# Patient Record
Sex: Female | Born: 1965 | Race: Black or African American | Hispanic: No | Marital: Single | Smoking: Never smoker
Health system: Southern US, Community
[De-identification: ages and names within clinical notes are randomized; demographics above are authoritative.]

## PROBLEM LIST (undated history)

## (undated) DIAGNOSIS — D649 Anemia, unspecified: Secondary | ICD-10-CM

## (undated) DIAGNOSIS — I10 Essential (primary) hypertension: Secondary | ICD-10-CM

## (undated) DIAGNOSIS — E079 Disorder of thyroid, unspecified: Secondary | ICD-10-CM

## (undated) HISTORY — DX: Anemia, unspecified: D64.9

---

## 2012-12-10 ENCOUNTER — Emergency Department (HOSPITAL_COMMUNITY)
Admission: EM | Admit: 2012-12-10 | Discharge: 2012-12-10 | Disposition: A | Discharge status: Home / Self Care | Payer: BC Managed Care – PPO | Attending: Emergency Medicine | Admitting: Emergency Medicine

## 2012-12-10 ENCOUNTER — Emergency Department (HOSPITAL_COMMUNITY): Payer: BC Managed Care – PPO

## 2012-12-10 ENCOUNTER — Encounter (HOSPITAL_COMMUNITY): Payer: Self-pay | Admitting: Nurse Practitioner

## 2012-12-10 DIAGNOSIS — D259 Leiomyoma of uterus, unspecified: Secondary | ICD-10-CM | POA: Insufficient documentation

## 2012-12-10 DIAGNOSIS — Z3202 Encounter for pregnancy test, result negative: Secondary | ICD-10-CM | POA: Insufficient documentation

## 2012-12-10 DIAGNOSIS — R110 Nausea: Secondary | ICD-10-CM

## 2012-12-10 DIAGNOSIS — Z79899 Other long term (current) drug therapy: Secondary | ICD-10-CM | POA: Insufficient documentation

## 2012-12-10 DIAGNOSIS — D219 Benign neoplasm of connective and other soft tissue, unspecified: Secondary | ICD-10-CM

## 2012-12-10 DIAGNOSIS — E079 Disorder of thyroid, unspecified: Secondary | ICD-10-CM | POA: Insufficient documentation

## 2012-12-10 DIAGNOSIS — Z7982 Long term (current) use of aspirin: Secondary | ICD-10-CM | POA: Insufficient documentation

## 2012-12-10 DIAGNOSIS — R102 Pelvic and perineal pain: Secondary | ICD-10-CM

## 2012-12-10 DIAGNOSIS — Z885 Allergy status to narcotic agent status: Secondary | ICD-10-CM | POA: Insufficient documentation

## 2012-12-10 DIAGNOSIS — I10 Essential (primary) hypertension: Secondary | ICD-10-CM | POA: Insufficient documentation

## 2012-12-10 HISTORY — DX: Essential (primary) hypertension: I10

## 2012-12-10 HISTORY — DX: Disorder of thyroid, unspecified: E07.9

## 2012-12-10 LAB — COMPREHENSIVE METABOLIC PANEL
AST: 23 U/L (ref 0–37)
CO2: 28 mEq/L (ref 19–32)
Calcium: 9 mg/dL (ref 8.4–10.5)
Creatinine, Ser: 0.98 mg/dL (ref 0.50–1.10)
GFR calc Af Amer: 80 mL/min — ABNORMAL LOW (ref >90)
GFR calc non Af Amer: 69 mL/min — ABNORMAL LOW (ref >90)
Total Protein: 7 g/dL (ref 6.0–8.3)

## 2012-12-10 LAB — CBC WITH DIFFERENTIAL/PLATELET
Hemoglobin: 12.9 g/dL (ref 12.0–15.0)
Lymphs Abs: 1.3 10*3/uL (ref 0.7–4.0)
Monocytes Relative: 13 % — ABNORMAL HIGH (ref 3–12)
Neutro Abs: 1.6 10*3/uL — ABNORMAL LOW (ref 1.7–7.7)
Neutrophils Relative %: 45 % (ref 43–77)
RBC: 4.18 MIL/uL (ref 3.87–5.11)

## 2012-12-10 LAB — URINALYSIS W MICROSCOPIC + REFLEX CULTURE
Glucose, UA: NEGATIVE mg/dL
Hgb urine dipstick: NEGATIVE
Ketones, ur: NEGATIVE mg/dL
Protein, ur: NEGATIVE mg/dL

## 2012-12-10 LAB — WET PREP, GENITAL
Clue Cells Wet Prep HPF POC: NONE SEEN
Trich, Wet Prep: NONE SEEN
Yeast Wet Prep HPF POC: NONE SEEN

## 2012-12-10 LAB — POCT PREGNANCY, URINE: Preg Test, Ur: NEGATIVE

## 2012-12-10 MED ORDER — TRAMADOL HCL 50 MG PO TABS: 50.0000 mg | ORAL_TABLET | Freq: Four times a day (QID) | ORAL | Status: DC | PRN

## 2012-12-10 MED ORDER — ONDANSETRON 4 MG PO TBDP: 4.0000 mg | ORAL_TABLET | Freq: Three times a day (TID) | ORAL | Status: DC | PRN

## 2012-12-10 NOTE — ED Provider Notes (Signed)
History     CSN: [id number]  Arrival date & time 12/10/12  6295   First MD Initiated Contact with Patient 12/10/12 1039      Chief Complaint  Patient presents with  . Abdominal Pain    (Consider location/radiation/quality/duration/timing/severity/associated sxs/prior treatment) HPI Comments: Patient presents to the ED for abdominal pain.  Patient states she saw her eye doctor last week and he told her there were spots in her eyes which could possibly indicate a gastrointestinal disorder or colon cancer. Ever since then she has experienced generalized abdominal pain associated with intermittent nausea but denies any episodes of vomiting.  Also notes that her stomach has been making weird noises lately and she does not know why.  BMs have been normal and without noted blood.  Has clear vaginal discharge which she feels is normal for her.  Pt is not currently sexually active and is not concerned for STDs at this time.  Denies any urinary sx including increased frequency, dysuria, or hematuria.  Pt recently found out she has a positive family hx of colon cancer on her mother's side- dx at age 38.  No prior GI sx.  No recent chest pain, SOB, fever, sweats, or chills.  Patient is a 47 y.o. female presenting with abdominal pain. The history is provided by the patient.  Abdominal Pain Associated symptoms: nausea     Past Medical History  Diagnosis Date  . Thyroid disease     No past surgical history on file.  No family history on file.  History  Substance Use Topics  . Smoking status: Never Smoker   . Smokeless tobacco: Not on file  . Alcohol Use: No    OB History   Grav Para Term Preterm Abortions TAB SAB Ect Mult Living                  Review of Systems  Gastrointestinal: Positive for nausea and abdominal pain.  All other systems reviewed and are negative.    Allergies  Hydrocodone  Home Medications   Current Outpatient Rx  Name  Route  Sig  Dispense  Refill  .  amLODipine (NORVASC) 10 MG tablet   Oral   Take 10 mg by mouth daily.         Marland Kitchen aspirin EC 81 MG tablet   Oral   Take 81 mg by mouth daily.         . Calcium Carbonate (CALCIUM 600 PO)   Oral   Take 600 mg by mouth daily.         . Cholecalciferol (VITAMIN D PO)   Oral   Take 1 tablet by mouth daily.         Marland Kitchen FERROUS SULFATE PO   Oral   Take 1 tablet by mouth daily.         . isosorbide mononitrate (IMDUR) 30 MG 24 hr tablet   Oral   Take 30 mg by mouth daily at 12 noon.         . Multiple Vitamin (MULTIVITAMIN WITH MINERALS) TABS   Oral   Take 1 tablet by mouth daily.           BP 127/89  Pulse 85  Temp(Src) 97.7 F (36.5 C) (Oral)  Resp 16  SpO2 100%  Physical Exam  Nursing note and vitals reviewed. Constitutional: She is oriented to person, place, and time. She appears well-developed and well-nourished.  HENT:  Head: Normocephalic and atraumatic.  Mouth/Throat: Oropharynx is  clear and moist.  Eyes: Conjunctivae and EOM are normal. Pupils are equal, round, and reactive to light.  Neck: Normal range of motion.  Cardiovascular: Normal rate, regular rhythm and normal heart sounds.   Pulmonary/Chest: Effort normal and breath sounds normal.  Abdominal: Soft. Bowel sounds are normal. There is no tenderness. There is no guarding.  Generalized abdominal discomfort without localized tenderness  Genitourinary: There is no injury on the left labia. Cervix exhibits no motion tenderness. Right adnexum displays tenderness. Left adnexum displays no tenderness. No bleeding around the vagina. Vaginal discharge found.  Small, wart-like lesions surrounding introitus, right adnexal pain, fibroids noted on bimanual, scant clear/white vaginal discharge without odor  Musculoskeletal: Normal range of motion.  Neurological: She is alert and oriented to person, place, and time.  Skin: Skin is warm and dry.  Psychiatric: She has a normal mood and affect.    ED Course   Procedures (including critical care time)  Labs Reviewed  WET PREP, GENITAL - Abnormal; Notable for the following:    WBC, Wet Prep HPF POC FEW (*)    All other components within normal limits  COMPREHENSIVE METABOLIC PANEL - Abnormal; Notable for the following:    GFR calc non Af Amer 69 (*)    GFR calc Af Amer 80 (*)    All other components within normal limits  CBC WITH DIFFERENTIAL - Abnormal; Notable for the following:    WBC 3.5 (*)    Platelets 129 (*)    Neutro Abs 1.6 (*)    Monocytes Relative 13 (*)    Eosinophils Relative 6 (*)    All other components within normal limits  URINALYSIS W MICROSCOPIC + REFLEX CULTURE - Abnormal; Notable for the following:    Squamous Epithelial / LPF FEW (*)    All other components within normal limits  GC/CHLAMYDIA PROBE AMP  LIPASE, BLOOD  URINALYSIS, MICROSCOPIC ONLY  POCT PREGNANCY, URINE   US Transvaginal Non-ob  12/10/2012  *RADIOLOGY REPORT*  Clinical Data:  Right lower quadrant pelvic pain  TRANSABDOMINAL AND TRANSVAGINAL ULTRASOUND OF PELVIS DOPPLER ULTRASOUND OF OVARIES  Technique:  Both transabdominal and transvaginal ultrasound examinations of the pelvis were performed. Transabdominal technique was performed for global imaging of the pelvis including uterus, ovaries, adnexal regions, and pelvic cul-de-sac.  It was necessary to proceed with endovaginal exam following the transabdominal exam to visualize the ovaries.  Color and duplex Doppler ultrasound was utilized to evaluate blood flow to the ovaries.  Comparison:  None.  Findings:  Uterus:  12.5 x 12.0 x 10.5 cm.  Globular in appearance with multiple myometrial masses, likely fibroids, with dominant masses as follows:  Uterine fundus, intramural, minimal mass effect upon the endometrium, 4.1 x 3.9 x 3.2 cm.  Left posterolateral uterine body, 4.1 x 4.3 x 3.2 cm, intramural  Posterior uterine fundus/body, intramural, with mild mass effect upon the endometrium, 4.6 x 4.4 x 3.4 cm   Endometrium:  7 mm.  Uniformly echogenic without focal abnormality.  Right ovary: 4.1 x 2.6 x 2.6 cm.  Normal.  Left ovary:    3.6 x 3.1 x 2.3 cm.  Normal.  Pulsed Doppler evaluation demonstrates normal low-resistance arterial and venous waveforms in both ovaries.  Small amount of free fluid noted in the right lower quadrant.  IMPRESSION: Uterine fibroids, no acute abnormality.  No sonographic evidence for ovarian torsion.   Original Report Authenticated By: Christiana Pellant, M.D.    US Pelvis Complete  12/10/2012  *RADIOLOGY REPORT*  Clinical Data:  Right lower quadrant pelvic pain  TRANSABDOMINAL AND TRANSVAGINAL ULTRASOUND OF PELVIS DOPPLER ULTRASOUND OF OVARIES  Technique:  Both transabdominal and transvaginal ultrasound examinations of the pelvis were performed. Transabdominal technique was performed for global imaging of the pelvis including uterus, ovaries, adnexal regions, and pelvic cul-de-sac.  It was necessary to proceed with endovaginal exam following the transabdominal exam to visualize the ovaries.  Color and duplex Doppler ultrasound was utilized to evaluate blood flow to the ovaries.  Comparison:  None.  Findings:  Uterus:  12.5 x 12.0 x 10.5 cm.  Globular in appearance with multiple myometrial masses, likely fibroids, with dominant masses as follows:  Uterine fundus, intramural, minimal mass effect upon the endometrium, 4.1 x 3.9 x 3.2 cm.  Left posterolateral uterine body, 4.1 x 4.3 x 3.2 cm, intramural  Posterior uterine fundus/body, intramural, with mild mass effect upon the endometrium, 4.6 x 4.4 x 3.4 cm  Endometrium:  7 mm.  Uniformly echogenic without focal abnormality.  Right ovary: 4.1 x 2.6 x 2.6 cm.  Normal.  Left ovary:    3.6 x 3.1 x 2.3 cm.  Normal.  Pulsed Doppler evaluation demonstrates normal low-resistance arterial and venous waveforms in both ovaries.  Small amount of free fluid noted in the right lower quadrant.  IMPRESSION: Uterine fibroids, no acute abnormality.  No sonographic  evidence for ovarian torsion.   Original Report Authenticated By: Christiana Pellant, M.D.    [place] Art/ven Flow Abd Pelv Doppler  12/10/2012  *RADIOLOGY REPORT*  Clinical Data:  Right lower quadrant pelvic pain  TRANSABDOMINAL AND TRANSVAGINAL ULTRASOUND OF PELVIS DOPPLER ULTRASOUND OF OVARIES  Technique:  Both transabdominal and transvaginal ultrasound examinations of the pelvis were performed. Transabdominal technique was performed for global imaging of the pelvis including uterus, ovaries, adnexal regions, and pelvic cul-de-sac.  It was necessary to proceed with endovaginal exam following the transabdominal exam to visualize the ovaries.  Color and duplex Doppler ultrasound was utilized to evaluate blood flow to the ovaries.  Comparison:  None.  Findings:  Uterus:  12.5 x 12.0 x 10.5 cm.  Globular in appearance with multiple myometrial masses, likely fibroids, with dominant masses as follows:  Uterine fundus, intramural, minimal mass effect upon the endometrium, 4.1 x 3.9 x 3.2 cm.  Left posterolateral uterine body, 4.1 x 4.3 x 3.2 cm, intramural  Posterior uterine fundus/body, intramural, with mild mass effect upon the endometrium, 4.6 x 4.4 x 3.4 cm  Endometrium:  7 mm.  Uniformly echogenic without focal abnormality.  Right ovary: 4.1 x 2.6 x 2.6 cm.  Normal.  Left ovary:    3.6 x 3.1 x 2.3 cm.  Normal.  Pulsed Doppler evaluation demonstrates normal low-resistance arterial and venous waveforms in both ovaries.  Small amount of free fluid noted in the right lower quadrant.  IMPRESSION: Uterine fibroids, no acute abnormality.  No sonographic evidence for ovarian torsion.   Original Report Authenticated By: Christiana Pellant, M.D.      1. Fibroids   2. Pelvic pain   3. Nausea       MDM   Pt presenting to the ED for abdominal pain.  Endorses associated nausea without vomiting.  Pt appears anxious and concerned for colon CA given recent info from eye doctor and newly discovered + family hx.    Labs largely  WNL.  No leukocytosis.  Pelvic exam with right adnexal tenderness- strong suspicion that pain is of pelvic origin.  U/S as above- significant fibroids.  Pt afebrile, non-toxic appearing, NAD, VS stable-  ok for d/c.  [facility] of fibroids.  Referred to GI, Dr. Loreta Ave, to discuss other concerns and possible colonoscopy.  Rx tramadol and zofran.  Discussed plan with pt, she agreed.  Return precautions advised.  Garlon Hatchet, PA-C 12/10/12 2237

## 2012-12-10 NOTE — ED Notes (Signed)
Pt reports she saw opthalmologist last week and he told her "Theres spots in my eyes and sometimes taht means you have colon cancer." states since he told her that she has been having RLQ pain, wants to be checked out. Reports she has some mild nausea but no bowel/bladder changes. A&Ox4, resp e/u

## 2012-12-11 NOTE — ED Provider Notes (Signed)
Medical screening examination/treatment/procedure(s) were conducted as a shared visit with non-physician practitioner(s) and myself.  I personally evaluated the patient during the encounter  Loren Racer, MD 12/11/12 1616

## 2013-01-04 ENCOUNTER — Other Ambulatory Visit (HOSPITAL_COMMUNITY)
Admission: RE | Admit: 2013-01-04 | Discharge: 2013-01-04 | Disposition: A | Discharge status: Home / Self Care | Payer: BC Managed Care – PPO | Source: Ambulatory Visit | Attending: Obstetrics & Gynecology | Admitting: Obstetrics & Gynecology

## 2013-01-04 ENCOUNTER — Encounter: Payer: Self-pay | Admitting: Obstetrics & Gynecology

## 2013-01-04 ENCOUNTER — Ambulatory Visit (INDEPENDENT_AMBULATORY_CARE_PROVIDER_SITE_OTHER): Payer: BC Managed Care – PPO | Admitting: Obstetrics & Gynecology

## 2013-01-04 VITALS — BP 133/88 | HR 75 | Temp 98.4°F | Ht 67.0 in | Wt 172.0 lb

## 2013-01-04 DIAGNOSIS — R102 Pelvic and perineal pain: Secondary | ICD-10-CM

## 2013-01-04 DIAGNOSIS — N949 Unspecified condition associated with female genital organs and menstrual cycle: Secondary | ICD-10-CM

## 2013-01-04 DIAGNOSIS — N926 Irregular menstruation, unspecified: Secondary | ICD-10-CM | POA: Insufficient documentation

## 2013-01-04 DIAGNOSIS — N920 Excessive and frequent menstruation with regular cycle: Secondary | ICD-10-CM

## 2013-01-04 DIAGNOSIS — Z124 Encounter for screening for malignant neoplasm of cervix: Secondary | ICD-10-CM

## 2013-01-04 DIAGNOSIS — D259 Leiomyoma of uterus, unspecified: Secondary | ICD-10-CM

## 2013-01-04 DIAGNOSIS — D219 Benign neoplasm of connective and other soft tissue, unspecified: Secondary | ICD-10-CM

## 2013-01-04 LAB — FOLLICLE STIMULATING HORMONE: FSH: 5.2 m[IU]/mL

## 2013-01-04 LAB — TSH: TSH: 1.342 u[IU]/mL (ref 0.350–4.500)

## 2013-01-04 MED ORDER — MEDROXYPROGESTERONE ACETATE 10 MG PO TABS: 20.0000 mg | ORAL_TABLET | Freq: Every day | ORAL | Status: DC

## 2013-01-04 MED ORDER — IMIQUIMOD 5 % EX CREA: TOPICAL_CREAM | CUTANEOUS | Status: DC

## 2013-01-04 NOTE — Progress Notes (Signed)
Subjective:     Patient ID: Heidi Barrett, female   DOB: May 01, 1966, 47 y.o.   MRN: [id number]  HPI 47 yo AA G0 was seen in the ED this weekend with complaints of pelvic pain.  She reports that the pain has resolved.  She reports a h/o very heavy cycles often staining her clothes at work.  Cycles in the past 2 months were actually closer than normal at 14 and 20 days apart.  Pt reports that she was previously married. Was sexually active for >5years with no contraception and no pregnancies.  47 y.o. Patient currently abstinent and has no partner.  Patient reports being prescribed OCP's in her 80's.  She took the pills for 3-4 weeks and developed a painful lump on her ankle.  She denies injury at the time.  She was told not to take OCP's in the future.  Past Medical History  Diagnosis Date  . Thyroid disease   . Hypertension   . Anemia   History reviewed. No pertinent past surgical history.  Current Outpatient Prescriptions on File Prior to Visit  Medication Sig Dispense Refill  . amLODipine (NORVASC) 10 MG tablet Take 10 mg by mouth daily.      Marland Kitchen aspirin EC 81 MG tablet Take 81 mg by mouth daily.      . Calcium Carbonate (CALCIUM 600 PO) Take 600 mg by mouth daily.      . Cholecalciferol (VITAMIN D PO) Take 1 tablet by mouth daily.      Marland Kitchen FERROUS SULFATE PO Take 1 tablet by mouth daily.      . isosorbide mononitrate (IMDUR) 30 MG 24 hr tablet Take 30 mg by mouth daily at 12 noon.      . Multiple Vitamin (MULTIVITAMIN WITH MINERALS) TABS Take 1 tablet by mouth daily.      . ondansetron (ZOFRAN ODT) 4 MG disintegrating tablet Take 1 tablet (4 mg total) by mouth every 8 (eight) hours as needed for nausea.  10 tablet  0  . traMADol (ULTRAM) 50 MG tablet Take 1 tablet (50 mg total) by mouth every 6 (six) hours as needed for pain.  15 tablet  0   No current facility-administered medications on file prior to visit.   Allergies  Allergen Reactions  . Hydrocodone Nausea And Vomiting    dizzines    History   Social History  . Marital Status: Single    Spouse Name: N/A    Number of Children: N/A  . Years of Education: N/A   Occupational History  . Not on file.   Social History Main Topics  . Smoking status: Never Smoker   . Smokeless tobacco: Never Used  . Alcohol Use: No  . Drug Use: No  . Sexually Active: No   Other Topics Concern  . Not on file   Social History Narrative  . No narrative on file   Family History  Problem Relation Age of Onset  . Heart disease Mother   . Cancer Father     prostate         Review of Systems     Objective:   Physical Exam BP 133/88  Pulse 75  Temp(Src) 98.4 F (36.9 C) (Oral)  Ht 5\' 7"  (1.702 m)  Wt 172 lb (78.019 kg)  BMI 26.93 kg/m2  LMP 12/25/2012 Pt in NAD  HEENT: palpable parotid glands bilaterally. Nontender. mobile Lungs: CTA CV: RRR ABD: NT, ND.  Mass palpable just below umbilicus.  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  Vagina: no blood in vault Cervix: no lesion; no mucopurulent d/c Uterus: large ~14 weeks sized; mobile Adnexa: no masses; non tender   The indications for endometrial biopsy were reviewed.   Risks of the biopsy including cramping, bleeding, infection, uterine perforation, inadequate specimen and need for additional procedures  were discussed. The patient states she understands and agrees to undergo procedure today. Consent was signed. Time out was performed. Urine HCG was negative. A sterile speculum was placed in the patient's vagina and the cervix was prepped with Betadine. A single-toothed tenaculum was placed on the anterior lip of the cervix to stabilize it. The 3 mm pipelle was introduced into the endometrial cavity without difficulty to a depth of 12cm, and a small amount of tissue was obtained and sent to pathology. The instruments were removed from the patient's vagina. Minimal bleeding from the cervix was noted. The patient tolerated the procedure well.      12/10/2012 TRANSABDOMINAL AND TRANSVAGINAL ULTRASOUND OF PELVIS  DOPPLER ULTRASOUND OF OVARIES  Technique: Both transabdominal and transvaginal ultrasound  examinations of the pelvis were performed. Transabdominal technique  was performed for global imaging of the pelvis including uterus,  ovaries, adnexal regions, and pelvic cul-de-sac.  It was necessary to proceed with endovaginal exam following the  transabdominal exam to visualize the ovaries.  Color and duplex Doppler ultrasound was utilized to evaluate blood  flow to the ovaries.  Comparison: None.  Findings:  Uterus: 12.5 x 12.0 x 10.5 cm. Globular in appearance with  multiple myometrial masses, likely fibroids, with dominant masses  as follows:  Uterine fundus, intramural, minimal mass effect upon the  endometrium, 4.1 x 3.9 x 3.2 cm.  Left posterolateral uterine body, 4.1 x 4.3 x 3.2 cm, intramural  Posterior uterine fundus/body, intramural, with mild mass effect  upon the endometrium, 4.6 x 4.4 x 3.4 cm  Endometrium: 7 mm. Uniformly echogenic without focal abnormality.  Right ovary: 4.1 x 2.6 x 2.6 cm. Normal.  Left ovary: 3.6 x 3.1 x 2.3 cm. Normal.  Pulsed Doppler evaluation demonstrates normal low-resistance  arterial and venous waveforms in both ovaries.  Small amount of free fluid noted in the right lower quadrant.  IMPRESSION:  Uterine fibroids, no acute abnormality.  No sonographic evidence for ovarian torsion.     Assessment:     Symptomatic fibroids. Patient wants to maintain her fertility but she is also aware that she is not in a relationship and that a pregnancy is unlikely.  She wants to review her options.  D/w patient myomectomy vs hyst.  She was informed that I could not operate on her in July and that she could see one of my partners.  She wants to wait.   S/p endometrial biopsy. Routine post-procedure instructions were given to the patient. The patient will follow up to review the results and for  further management.     Genital warts      Plan:     Aldara 3x/week F/u in 2-3 months or sooner prn Provera 20 mg q day (OCP's a relative contraindication due to past h/o a knot on her ankle 3-4 weeks after starting OCP's and her h/o chest pain on Nitrates)  F/u PAP and cx F/u surg path

## 2013-01-04 NOTE — Patient Instructions (Addendum)
Uterine Fibroid A uterine fibroid is a growth (tumor) that occurs in a woman's uterus. This type of tumor is not cancerous and does not spread out of the uterus. A woman can have one or many fibroids, and the fiboid(s) can become quite large. A fibroid can vary in size, weight, and where it grows in the uterus. Most fibroids do not require medical treatment, but some can cause pain or heavy bleeding during and between periods. CAUSES  A fibroid is the result of a single uterine cell that keeps growing (unregulated), which is different than most cells in the human body. Most cells have a control mechanism that keeps them from reproducing without control.  SYMPTOMS   Bleeding.  Pelvic pain and pressure.  Bladder problems due to the size of the fibroid.  Infertility and miscarriages depending on the size and location of the fibroid. DIAGNOSIS  A diagnosis is made by physical exam. Your caregiver may feel the lumpy tumors during a pelvic exam. Important information regarding size, location, and number of tumors can be gained by having an ultrasound. It is rare that other tests, such as a CT scan or MRI, are needed. TREATMENT   Your caregiver may recommend watchful waiting. This involves getting the fibroid checked by your caregiver to see if the fibroids grow or shrink.   Hormonal treatment or an intrauterine device (IUD) may be prescribed.   Surgery may be needed to remove the fibroids (myomectomy) or the uterus (hysterectomy). This depends on your situation. When fibroids interfere with fertility and a woman wants to become pregnant, a caregiver may recommend having the fibroids removed.  HOME CARE INSTRUCTIONS  Home care depends on how you were treated. In general:   Keep all follow-up appointments with your caregiver.   Only take medicine as told by your caregiver. Do not take aspirin. It can cause bleeding.   If you have excessive periods and soak tampons or pads in a half hour or  less, contact your caregiver immediately. If your periods are troublesome but not so heavy, lie down with your feet raised slightly above your heart. Place cold packs on your lower abdomen.   If your periods are heavy, write down the number of pads or tampons you use per month. Bring this information to your caregiver.   Talk to your caregiver about taking iron pills.   Include green vegetables in your diet.   If you were prescribed a hormonal treatment, take the hormonal medicines as directed.   If you need surgery, ask your caregiver for information on your specific surgery.  SEEK IMMEDIATE MEDICAL CARE IF:  You have pelvic pain or cramps not controlled with medicines.   You have a sudden increase in pelvic pain.   You have an increase of bleeding between and during periods.   You feel lightheaded or have fainting episodes.  MAKE SURE YOU:  Understand these instructions.  Will watch your condition.  Will get help right away if you are not doing well or get worse. Document Released: 07/19/2000 Document Revised: 10/14/2011 Document Reviewed: 08/12/2011 [facility] Patient Information 2014 [place], [place]. Genital Warts Genital warts are a sexually transmitted infection. They may appear as small bumps on the tissues of the genital area. CAUSES  Genital warts are caused by a virus called human papillomavirus (HPV). HPV is the most common sexually transmitted disease (STD) and infection of the sex organs. This infection is spread by having unprotected sex with an infected person. It can be spread  by vaginal, anal, and oral sex. Many people do not know they are infected. They may be infected for years without problems. However, even if they do not have problems, they can unknowingly pass the infection to their sexual partners. SYMPTOMS   Itching and irritation in the genital area.  Warts that bleed.  Painful sexual intercourse. DIAGNOSIS  Warts are usually recognized with  the naked eye on the vagina, vulva, perineum, anus, and rectum. Certain tests can also diagnose genital warts, such as:  A Pap test.  A tissue sample (biopsy) exam.  Colposcopy. A magnifying tool is used to examine the vagina and cervix. The HPV cells will change color when certain solutions are used. TREATMENT  Warts can be removed by:  Applying certain chemicals, such as cantharidin or podophyllin.  Liquid nitrogen freezing (cryotherapy).  Immunotherapy with candida or trichophyton injections.  Laser treatment.  Burning with an electrified probe (electrocautery).  Interferon injections.  Surgery. PREVENTION  HPV vaccination can help prevent HPV infections that cause genital warts and that cause cancer of the cervix. It is recommended that the vaccination be given to people between the ages 33 to 19 years old. The vaccine might not work as well or might not work at all if you already have HPV. It should not be given to pregnant women. HOME CARE INSTRUCTIONS   It is important to follow your caregiver's instructions. The warts will not go away without treatment. Repeat treatments are often needed to get rid of warts. Even after it appears that the warts are gone, the normal tissue underneath often remains infected.  Do not try to treat genital warts with medicine used to treat hand warts. This type of medicine is strong and can burn the skin in the genital area, causing more damage.  Tell your past and current sexual partner(s) that you have genital warts. They may be infected also and need treatment.  Avoid sexual contact while being treated.  Do not touch or scratch the warts. The infection may spread to other parts of your body.  Women with genital warts should have a cervical cancer check (Pap test) at least once a year. This type of cancer is slow-growing and can be cured if found early. Chances of developing cervical cancer are increased with HPV.  Inform your obstetrician  about your warts in the event of pregnancy. This virus can be passed to the baby's respiratory tract. Discuss this with your caregiver.  Use a condom during sexual intercourse. Following treatment, the use of condoms will help prevent reinfection.  Ask your caregiver about using over-the-counter anti-itch creams. SEEK MEDICAL CARE IF:   Your treated skin becomes red, swollen, or painful.  You have a fever.  You feel generally ill.  You feel little lumps in and around your genital area.  You are bleeding or have painful sexual intercourse. MAKE SURE YOU:   Understand these instructions.  Will watch your condition.  Will get help right away if you are not doing well or get worse. Document Released: 07/19/2000 Document Revised: 10/14/2011 Document Reviewed: 01/28/2011 [facility] Patient Information 2014 [place], [place].

## 2013-01-06 ENCOUNTER — Telehealth: Payer: Self-pay | Admitting: *Deleted

## 2013-01-06 NOTE — Telephone Encounter (Signed)
Called Susy and left a message we are calling with some information from your provider , we will call again, if there is a better time/ number to call you- please leave that information on our voicemail

## 2013-01-06 NOTE — Telephone Encounter (Signed)
Message copied by Heidi Barrett on Wed Jan 06, 2013  4:26 PM ------      Message from: Willodean Rosenthal      Created: Wed Jan 06, 2013  3:56 PM       Please call pt.  Her endobx was normal.  Please ask her to f/u as previously scheduled.            clh-S ------

## 2013-01-07 NOTE — Telephone Encounter (Signed)
Called Heidi Barrett and notified her biopsy results normal and to keep follow up as scheduled. Patient states she did not schedule follow up yet, per front office staff informed [name] to please call back next week to schedule follow up as July schedule not released yet and June is full . Also patient requests results of labs, informed her all results not back,and what is back is wnl, but once doctor reviews all she will have [place] call patient if there are any concerns.  Patient voices understanding.

## 2013-01-09 LAB — ESTRIOL, LC/MS/MS: Estriol: <0.1 ng/mL

## 2013-01-20 ENCOUNTER — Telehealth: Payer: Self-pay | Admitting: *Deleted

## 2013-01-20 NOTE — Telephone Encounter (Signed)
Pt left message stating that she has been having consistent bleeding x7 days ever since starting the medication medroxyprogesterone. She wants to know how long should she continue to take the medicine or if she needs an alternate Rx. I returned pt's call and left message stating that if she would like me to leave detailed instructions and information on her voice mail to call back with that message. I will call her again tomorrow.

## 2013-01-25 NOTE — Telephone Encounter (Signed)
Called pt and left message that this is our second attempt to please give [place] a call if she continues to have any further questions comments or concerns.

## 2013-02-08 ENCOUNTER — Encounter: Payer: Self-pay | Admitting: *Deleted

## 2013-04-01 ENCOUNTER — Telehealth: Payer: Self-pay

## 2013-04-01 DIAGNOSIS — Z124 Encounter for screening for malignant neoplasm of cervix: Secondary | ICD-10-CM

## 2013-04-01 DIAGNOSIS — R102 Pelvic and perineal pain: Secondary | ICD-10-CM

## 2013-04-01 DIAGNOSIS — D219 Benign neoplasm of connective and other soft tissue, unspecified: Secondary | ICD-10-CM

## 2013-04-01 DIAGNOSIS — N920 Excessive and frequent menstruation with regular cycle: Secondary | ICD-10-CM

## 2013-04-01 MED ORDER — MEDROXYPROGESTERONE ACETATE 10 MG PO TABS: 20.0000 mg | ORAL_TABLET | Freq: Every day | ORAL | Status: DC

## 2013-04-01 NOTE — Telephone Encounter (Signed)
Pt called wanting a refill on her provera. Called pt and left message that the Rx she requested is at her [facility].  If she has any questions to please give [place] a call.

## 2013-04-17 ENCOUNTER — Emergency Department (HOSPITAL_COMMUNITY)
Admission: EM | Admit: 2013-04-17 | Discharge: 2013-04-17 | Disposition: A | Discharge status: Home / Self Care | Payer: BC Managed Care – PPO | Source: Home / Self Care

## 2013-04-17 ENCOUNTER — Encounter (HOSPITAL_COMMUNITY): Payer: Self-pay | Admitting: Emergency Medicine

## 2013-04-17 DIAGNOSIS — S29019A Strain of muscle and tendon of unspecified wall of thorax, initial encounter: Secondary | ICD-10-CM

## 2013-04-17 DIAGNOSIS — S239XXA Sprain of unspecified parts of thorax, initial encounter: Secondary | ICD-10-CM

## 2013-04-17 MED ORDER — CYCLOBENZAPRINE HCL 5 MG PO TABS: 5.0000 mg | ORAL_TABLET | Freq: Two times a day (BID) | ORAL | Status: DC | PRN

## 2013-04-17 MED ORDER — TRAMADOL HCL 50 MG PO TABS: 50.0000 mg | ORAL_TABLET | Freq: Four times a day (QID) | ORAL | Status: DC | PRN

## 2013-04-17 NOTE — ED Provider Notes (Signed)
CSN: [id number]     Arrival date & time 04/17/13  1103 History   First MD Initiated Contact with Patient 04/17/13 1225     Chief Complaint  Patient presents with  . Back Pain   (Consider location/radiation/quality/duration/timing/severity/associated sxs/prior Treatment) HPI Comments: 47 year old female has been lifting and placing boxes from one place to another over the past week. No the past 2-3 days she has been experiencing right upper parathoracic muscle pain. The pain is worse with lifting, rotation of the torso and bending. There is minor pain to the right low back. Denies focal paresthesias or motor weakness.   Past Medical History  Diagnosis Date  . Thyroid disease   . Hypertension   . Anemia    No past surgical history on file. Family History  Problem Relation Age of Onset  . Heart disease Mother   . Cancer Father     prostate   History  Substance Use Topics  . Smoking status: Never Smoker   . Smokeless tobacco: Never Used  . Alcohol Use: No   OB History   Grav Para Term Preterm Abortions TAB SAB Ect Mult Living   0              Review of Systems  Constitutional: Negative for fever, chills and activity change.  HENT: Negative.   Respiratory: Negative.   Cardiovascular: Negative.   Musculoskeletal:       As per HPI  Skin: Negative for color change, pallor and rash.  Neurological: Negative.     Allergies  Hydrocodone  Home Medications   Current Outpatient Rx  Name  Route  Sig  Dispense  Refill  . amLODipine (NORVASC) 10 MG tablet   Oral   Take 10 mg by mouth daily.         Marland Kitchen aspirin EC 81 MG tablet   Oral   Take 81 mg by mouth daily.         . Calcium Carbonate (CALCIUM 600 PO)   Oral   Take 600 mg by mouth daily.         . Cholecalciferol (VITAMIN D PO)   Oral   Take 1 tablet by mouth daily.         . cyclobenzaprine (FLEXERIL) 5 MG tablet   Oral   Take 1 tablet (5 mg total) by mouth 2 (two) times daily as needed for muscle  spasms. May cause drowsiness   20 tablet   0   . FERROUS SULFATE PO   Oral   Take 1 tablet by mouth daily.         . imiquimod (ALDARA) 5 % cream   Topical   Apply topically 3 (three) times a week.   12 each   2   . isosorbide mononitrate (IMDUR) 30 MG 24 hr tablet   Oral   Take 30 mg by mouth daily at 12 noon.         . medroxyPROGESTERone (PROVERA) 10 MG tablet   Oral   Take 2 tablets (20 mg total) by mouth daily.   60 tablet   3   . Multiple Vitamin (MULTIVITAMIN WITH MINERALS) TABS   Oral   Take 1 tablet by mouth daily.         . ondansetron (ZOFRAN ODT) 4 MG disintegrating tablet   Oral   Take 1 tablet (4 mg total) by mouth every 8 (eight) hours as needed for nausea.   10 tablet   0   .  traMADol (ULTRAM) 50 MG tablet   Oral   Take 1 tablet (50 mg total) by mouth every 6 (six) hours as needed for pain.   20 tablet   0    BP 138/88  Pulse 76  Temp(Src) 98.6 F (37 C) (Oral)  Resp 14  SpO2 100% Physical Exam  Constitutional: She is oriented to person, place, and time. She appears well-developed and well-nourished. No distress.  HENT:  Head: Normocephalic and atraumatic.  Eyes: EOM are normal. Pupils are equal, round, and reactive to light.  Neck: Normal range of motion. Neck supple.  Musculoskeletal:  Tenderness to the right para thoracic musculature. Tenderness extends from the mid right scapula inferiorly to approximately 1011 and 12. No spinal tenderness. Minor tenderness to the right lateral most lumbar paraspinal muscle. Rotating the waist 20 to the right is limited by pain. Flexion to 30 but limited to pain. She has a normal gait. Distal sensory motor is normal. Gait smooth and balance.  Lymphadenopathy:    She has no cervical adenopathy.  Neurological: She is alert and oriented to person, place, and time. No cranial nerve deficit.  Skin: Skin is warm and dry.  Psychiatric: She has a normal mood and affect.    ED Course  Procedures  (including critical care time) Labs Review Labs Reviewed - No data to display Imaging Review No results found.  MDM   1. Thoracic myofascial strain, initial encounter      Tramadol 50 mg every 4-6 hours when necessary pain Flexeril 5 mg twice a day for muscle relaxation. Patient is aware that these medicines can cause drowsiness and should not be driving or working while taking them. She has taken them before and actually asked for the tramadol. She is given instructions on limits to her activity as well as stretches and later on back exercises.     Hayden Rasmussen, NP 04/17/13 1249

## 2013-04-17 NOTE — ED Notes (Signed)
Pt c/o back pain and belives she may have pulled a muscle at work This happened yest while lifting 50lbs objects through out the day Has taken tramadol and a muscle relaxer in the past that helped  Denies urinary sxs Alert w/no signs of acute distress.

## 2013-04-18 NOTE — ED Provider Notes (Signed)
Medical screening examination/treatment/procedure(s) were performed by a resident physician or non-physician practitioner and as the supervising physician I was immediately available for consultation/collaboration.  Cadie Sorci, MD    Amali Uhls S Solace Wendorff, MD 04/18/13 0849 

## 2013-07-13 ENCOUNTER — Ambulatory Visit: Payer: BC Managed Care – PPO

## 2013-07-26 ENCOUNTER — Ambulatory Visit: Payer: BC Managed Care – PPO | Attending: Internal Medicine

## 2013-07-27 ENCOUNTER — Encounter (HOSPITAL_COMMUNITY): Payer: Self-pay | Admitting: Emergency Medicine

## 2013-07-27 ENCOUNTER — Emergency Department (HOSPITAL_COMMUNITY)
Admission: EM | Admit: 2013-07-27 | Discharge: 2013-07-27 | Disposition: A | Discharge status: Home / Self Care | Payer: PRIVATE HEALTH INSURANCE | Source: Home / Self Care

## 2013-07-27 ENCOUNTER — Telehealth: Payer: Self-pay

## 2013-07-27 DIAGNOSIS — I10 Essential (primary) hypertension: Secondary | ICD-10-CM

## 2013-07-27 MED ORDER — AMLODIPINE BESYLATE 10 MG PO TABS: ORAL_TABLET | ORAL | Status: DC

## 2013-07-27 NOTE — ED Provider Notes (Signed)
CSN: [id number]     Arrival date & time 07/27/13  0915 History   First MD Initiated Contact with Patient 07/27/13 1007     Chief Complaint  Patient presents with  . Medication Refill   (Consider location/radiation/quality/duration/timing/severity/associated sxs/prior Treatment) HPI Comments: 47 year old female recently moved to [facility] from [place] has a history of hypertension. She is being treated amlodipine 10 mg daily. She has an appointment with the adult [facility] on January 29 2 get refills on all of her medications and for establishment. She has no complaints today she is requesting a refill on the blood pressure medicine.   Past Medical History  Diagnosis Date  . Thyroid disease   . Hypertension   . Anemia    History reviewed. No pertinent past surgical history. Family History  Problem Relation Age of Onset  . Heart disease Mother   . Cancer Father     prostate   History  Substance Use Topics  . Smoking status: Never Smoker   . Smokeless tobacco: Never Used  . Alcohol Use: No   OB History   Grav Para Term Preterm Abortions TAB SAB Ect Mult Living   0              Review of Systems  All other systems reviewed and are negative.    Allergies  Hydrocodone  Home Medications   Current Outpatient Rx  Name  Route  Sig  Dispense  Refill  . amLODipine (NORVASC) 10 MG tablet   Oral   Take 10 mg by mouth daily.         Marland Kitchen amLODipine (NORVASC) 10 MG tablet      One tab po for BP   40 tablet   0   . aspirin EC 81 MG tablet   Oral   Take 81 mg by mouth daily.         . Calcium Carbonate (CALCIUM 600 PO)   Oral   Take 600 mg by mouth daily.         . Cholecalciferol (VITAMIN D PO)   Oral   Take 1 tablet by mouth daily.         . cyclobenzaprine (FLEXERIL) 5 MG tablet   Oral   Take 1 tablet (5 mg total) by mouth 2 (two) times daily as needed for muscle spasms. May cause drowsiness   20 tablet   0   . FERROUS SULFATE PO   Oral  Take 1 tablet by mouth daily.         . imiquimod (ALDARA) 5 % cream   Topical   Apply topically 3 (three) times a week.   12 each   2   . isosorbide mononitrate (IMDUR) 30 MG 24 hr tablet   Oral   Take 30 mg by mouth daily at 12 noon.         . medroxyPROGESTERone (PROVERA) 10 MG tablet   Oral   Take 2 tablets (20 mg total) by mouth daily.   60 tablet   3   . Multiple Vitamin (MULTIVITAMIN WITH MINERALS) TABS   Oral   Take 1 tablet by mouth daily.         . ondansetron (ZOFRAN ODT) 4 MG disintegrating tablet   Oral   Take 1 tablet (4 mg total) by mouth every 8 (eight) hours as needed for nausea.   10 tablet   0   . traMADol (ULTRAM) 50 MG tablet   Oral   Take  1 tablet (50 mg total) by mouth every 6 (six) hours as needed for pain.   20 tablet   0    BP 138/78  Pulse 79  Temp(Src) 98.1 F (36.7 C) (Oral)  Resp 16  SpO2 100% Physical Exam  Nursing note and vitals reviewed. Constitutional: She is oriented to person, place, and time. She appears well-developed and well-nourished. No distress.  Eyes: Conjunctivae are normal.  Neck: Normal range of motion. Neck supple.  Cardiovascular: Normal rate, regular rhythm and normal heart sounds.   Pulmonary/Chest: Effort normal and breath sounds normal. No respiratory distress. She has no wheezes.  Musculoskeletal: Normal range of motion. She exhibits no edema.  Lymphadenopathy:    She has no cervical adenopathy.  Neurological: She is alert and oriented to person, place, and time. She exhibits normal muscle tone.  Skin: Skin is warm and dry.  Psychiatric: She has a normal mood and affect.    ED Course  Procedures (including critical care time) Labs Review Labs Reviewed - No data to display Imaging Review No results found.    MDM   1. HTN (hypertension)      RF amlodipine 10 mg 1 q d. #40.   Keep appointment next month  Hayden Rasmussen, NP 07/27/13 1023

## 2013-07-27 NOTE — ED Notes (Signed)
Requesting refill BP medication

## 2013-07-27 NOTE — Telephone Encounter (Signed)
Heidi Barrett from health dept. Called to clarify BP med order Explained to her that she would have to call urgent care to get the order clarified They are the person that wrote the script

## 2013-07-27 NOTE — ED Provider Notes (Signed)
Medical screening examination/treatment/procedure(s) were performed by resident physician or non-physician practitioner and as supervising physician I was immediately available for consultation/collaboration.   Aneita Kiger DOUGLAS MD.   Elisa Kutner D Ah Bott, MD 07/27/13 1425 

## 2013-08-02 ENCOUNTER — Telehealth: Payer: Self-pay | Admitting: *Deleted

## 2013-08-02 DIAGNOSIS — Z124 Encounter for screening for malignant neoplasm of cervix: Secondary | ICD-10-CM

## 2013-08-02 DIAGNOSIS — N920 Excessive and frequent menstruation with regular cycle: Secondary | ICD-10-CM

## 2013-08-02 DIAGNOSIS — D219 Benign neoplasm of connective and other soft tissue, unspecified: Secondary | ICD-10-CM

## 2013-08-02 DIAGNOSIS — R102 Pelvic and perineal pain: Secondary | ICD-10-CM

## 2013-08-02 NOTE — Telephone Encounter (Signed)
Pt called nurse line requesting refill of birth control pills.

## 2013-08-03 MED ORDER — MEDROXYPROGESTERONE ACETATE 10 MG PO TABS: 20.0000 mg | ORAL_TABLET | Freq: Every day | ORAL | Status: DC

## 2013-08-03 NOTE — Telephone Encounter (Signed)
Spoke with patient, request for Provera confirmed.  Spoke with Dr. Ike Bene concerning 1 month refill until we can schedule for follow up appt.  Prescription sent.

## 2013-08-26 ENCOUNTER — Telehealth: Payer: Self-pay | Admitting: *Deleted

## 2013-08-26 DIAGNOSIS — N920 Excessive and frequent menstruation with regular cycle: Secondary | ICD-10-CM

## 2013-08-26 DIAGNOSIS — R102 Pelvic and perineal pain: Secondary | ICD-10-CM

## 2013-08-26 DIAGNOSIS — Z124 Encounter for screening for malignant neoplasm of cervix: Secondary | ICD-10-CM

## 2013-08-26 DIAGNOSIS — D219 Benign neoplasm of connective and other soft tissue, unspecified: Secondary | ICD-10-CM

## 2013-08-26 MED ORDER — MEDROXYPROGESTERONE ACETATE 10 MG PO TABS: 20.0000 mg | ORAL_TABLET | Freq: Every day | ORAL | Status: DC

## 2013-08-26 NOTE — Telephone Encounter (Signed)
Original rx sent in wrong quantity. Rx resent for one month. Patient states she will call to make appt in the morning.

## 2013-09-02 ENCOUNTER — Ambulatory Visit: Payer: BC Managed Care – PPO | Admitting: Internal Medicine

## 2013-09-02 ENCOUNTER — Ambulatory Visit: Payer: PRIVATE HEALTH INSURANCE | Attending: Internal Medicine | Admitting: Internal Medicine

## 2013-09-02 ENCOUNTER — Encounter: Payer: Self-pay | Admitting: Internal Medicine

## 2013-09-02 VITALS — BP 135/90 | HR 83 | Temp 98.7°F | Resp 14 | Ht 67.0 in | Wt 181.4 lb

## 2013-09-02 DIAGNOSIS — M25559 Pain in unspecified hip: Secondary | ICD-10-CM

## 2013-09-02 DIAGNOSIS — I10 Essential (primary) hypertension: Secondary | ICD-10-CM | POA: Insufficient documentation

## 2013-09-02 DIAGNOSIS — Z0000 Encounter for general adult medical examination without abnormal findings: Secondary | ICD-10-CM

## 2013-09-02 DIAGNOSIS — E039 Hypothyroidism, unspecified: Secondary | ICD-10-CM | POA: Insufficient documentation

## 2013-09-02 MED ORDER — AMOXICILLIN-POT CLAVULANATE 875-125 MG PO TABS: 1.0000 | ORAL_TABLET | Freq: Two times a day (BID) | ORAL | Status: DC

## 2013-09-02 MED ORDER — LEVOTHYROXINE SODIUM 150 MCG PO TABS: 150.0000 ug | ORAL_TABLET | Freq: Every day | ORAL | Status: DC

## 2013-09-02 MED ORDER — ISOSORBIDE MONONITRATE ER 30 MG PO TB24: 30.0000 mg | ORAL_TABLET | Freq: Every day | ORAL | Status: DC

## 2013-09-02 MED ORDER — TRAMADOL HCL 50 MG PO TABS: 50.0000 mg | ORAL_TABLET | Freq: Four times a day (QID) | ORAL | Status: DC | PRN

## 2013-09-02 MED ORDER — AMLODIPINE BESYLATE 10 MG PO TABS: ORAL_TABLET | ORAL | Status: DC

## 2013-09-02 MED ORDER — CYCLOBENZAPRINE HCL 5 MG PO TABS: 5.0000 mg | ORAL_TABLET | Freq: Two times a day (BID) | ORAL | Status: DC | PRN
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
## 2013-09-02 NOTE — Progress Notes (Signed)
Pt is here to establish care. Pt suffers from hypertension and thyroid disease. Needs medication refill and a dentist referral. Also complains of pain in Rt hip x3 weeks; pain scale of 7 today. While sleeping a certain way, laying or walking, pain can occur.

## 2013-09-03 ENCOUNTER — Telehealth: Payer: Self-pay | Admitting: *Deleted

## 2013-09-03 LAB — TSH: TSH: 2.526 u[IU]/mL (ref 0.350–4.500)

## 2013-09-03 LAB — LIPID PANEL
CHOLESTEROL: 162 mg/dL (ref 0–200)
HDL: 59 mg/dL (ref >39)
LDL CALC: 91 mg/dL (ref 0–99)
Total CHOL/HDL Ratio: 2.7 Ratio
Triglycerides: 58 mg/dL (ref <150)
VLDL: 12 mg/dL (ref 0–40)

## 2013-09-03 LAB — VITAMIN D 25 HYDROXY (VIT D DEFICIENCY, FRACTURES): Vit D, 25-Hydroxy: 38 ng/mL (ref 30–89)

## 2013-09-03 LAB — CBC WITH DIFFERENTIAL/PLATELET
Basophils Absolute: 0 10*3/uL (ref 0.0–0.1)
Basophils Relative: 1 % (ref 0–1)
EOS ABS: 0.3 10*3/uL (ref 0.0–0.7)
Eosinophils Relative: 9 % — ABNORMAL HIGH (ref 0–5)
HEMATOCRIT: 36.3 % (ref 36.0–46.0)
HEMOGLOBIN: 12.6 g/dL (ref 12.0–15.0)
LYMPHS ABS: 1.5 10*3/uL (ref 0.7–4.0)
Lymphocytes Relative: 40 % (ref 12–46)
MCH: 31 pg (ref 26.0–34.0)
MCHC: 34.7 g/dL (ref 30.0–36.0)
MCV: 89.4 fL (ref 78.0–100.0)
MONO ABS: 0.3 10*3/uL (ref 0.1–1.0)
MONOS PCT: 8 % (ref 3–12)
NEUTROS PCT: 42 % — AB (ref 43–77)
Neutro Abs: 1.6 10*3/uL — ABNORMAL LOW (ref 1.7–7.7)
Platelets: 147 10*3/uL — ABNORMAL LOW (ref 150–400)
RBC: 4.06 MIL/uL (ref 3.87–5.11)
RDW: 13.4 % (ref 11.5–15.5)
WBC: 3.8 10*3/uL — ABNORMAL LOW (ref 4.0–10.5)

## 2013-09-03 LAB — COMPLETE METABOLIC PANEL WITH GFR
ALBUMIN: 4.4 g/dL (ref 3.5–5.2)
ALT: 13 U/L (ref 0–35)
AST: 13 U/L (ref 0–37)
Alkaline Phosphatase: 43 U/L (ref 39–117)
BILIRUBIN TOTAL: 0.5 mg/dL (ref 0.2–1.2)
BUN: 14 mg/dL (ref 6–23)
CO2: 25 meq/L (ref 19–32)
Calcium: 9.2 mg/dL (ref 8.4–10.5)
Chloride: 106 mEq/L (ref 96–112)
Creat: 0.9 mg/dL (ref 0.50–1.10)
GFR, EST NON AFRICAN AMERICAN: 77 mL/min
GFR, Est African American: 89 mL/min
GLUCOSE: 79 mg/dL (ref 70–99)
Potassium: 3.7 mEq/L (ref 3.5–5.3)
Sodium: 140 mEq/L (ref 135–145)
Total Protein: 6.7 g/dL (ref 6.0–8.3)

## 2013-09-03 LAB — T4, FREE: Free T4: 1.62 ng/dL (ref 0.80–1.80)

## 2013-09-03 NOTE — Telephone Encounter (Signed)
Left pt a voicemail to give us a call back. 

## 2013-09-03 NOTE — Telephone Encounter (Signed)
Contacted pt to notify her of her MRI appointment 09/16/13 at 4:00pm at [facility]. Pt should arrive at 3:45pm for registration.

## 2013-09-03 NOTE — Telephone Encounter (Signed)
Message copied by Shalom Mcguiness, [place] R on Fri Sep 03, 2013  2:03 PM ------      Message from: Allyson Sabal MD, [facility]      Created: Fri Sep 03, 2013 11:02 AM       Notify patient of the labs are normal ------

## 2013-09-08 ENCOUNTER — Other Ambulatory Visit: Payer: Self-pay | Admitting: Internal Medicine

## 2013-09-08 DIAGNOSIS — Z1231 Encounter for screening mammogram for malignant neoplasm of breast: Secondary | ICD-10-CM

## 2013-09-09 ENCOUNTER — Telehealth: Payer: Self-pay | Admitting: Internal Medicine

## 2013-09-09 NOTE — Telephone Encounter (Signed)
Left message for pt to call clinic

## 2013-09-09 NOTE — Telephone Encounter (Signed)
Pt returning call about blood work results, please f/u with pt.

## 2013-09-13 ENCOUNTER — Telehealth: Payer: Self-pay | Admitting: Emergency Medicine

## 2013-09-13 NOTE — Telephone Encounter (Signed)
Pt called in requesting lab results. Labs results given

## 2013-09-16 ENCOUNTER — Ambulatory Visit (HOSPITAL_COMMUNITY): Admission: RE | Admit: 2013-09-16 | Payer: PRIVATE HEALTH INSURANCE | Source: Ambulatory Visit

## 2013-09-16 ENCOUNTER — Telehealth: Payer: Self-pay | Admitting: Internal Medicine

## 2013-09-16 NOTE — Telephone Encounter (Signed)
Scheduled patient new appointment for MRI Patient is aware that the MRI is 10/04/13 at 1 pm  [facility] facility

## 2013-09-16 NOTE — Telephone Encounter (Signed)
Pt called regarding her MRI appointment, pt states that she has missed her appointment that she had scheduled for today @ 3:45pm and would like for the nurse to schedule another MRI appointment for her before he Dr.'s appointment on the 27 th of february. Please contact pt

## 2013-09-21 ENCOUNTER — Other Ambulatory Visit: Payer: Self-pay | Admitting: Obstetrics & Gynecology

## 2013-09-22 ENCOUNTER — Telehealth: Payer: Self-pay

## 2013-09-22 MED ORDER — MEDROXYPROGESTERONE ACETATE 10 MG PO TABS: ORAL_TABLET | ORAL | Status: DC

## 2013-09-22 NOTE — Telephone Encounter (Signed)
Patient called needs refill on birth control until she gets in with specialist Refill sent to pharmacy

## 2013-10-01 ENCOUNTER — Ambulatory Visit: Payer: PRIVATE HEALTH INSURANCE | Admitting: Sports Medicine

## 2013-10-04 ENCOUNTER — Ambulatory Visit (HOSPITAL_COMMUNITY): Payer: PRIVATE HEALTH INSURANCE

## 2013-10-05 ENCOUNTER — Ambulatory Visit: Payer: PRIVATE HEALTH INSURANCE

## 2013-10-12 ENCOUNTER — Ambulatory Visit (HOSPITAL_COMMUNITY): Admission: RE | Admit: 2013-10-12 | Payer: PRIVATE HEALTH INSURANCE | Source: Ambulatory Visit

## 2013-10-15 ENCOUNTER — Ambulatory Visit: Payer: PRIVATE HEALTH INSURANCE

## 2013-10-26 ENCOUNTER — Other Ambulatory Visit: Payer: Self-pay | Admitting: Internal Medicine

## 2013-10-28 ENCOUNTER — Encounter: Payer: PRIVATE HEALTH INSURANCE | Admitting: Obstetrics & Gynecology

## 2013-11-01 ENCOUNTER — Ambulatory Visit: Payer: PRIVATE HEALTH INSURANCE | Admitting: Internal Medicine

## 2013-11-09 ENCOUNTER — Ambulatory Visit: Payer: PRIVATE HEALTH INSURANCE | Attending: Internal Medicine

## 2013-11-10 ENCOUNTER — Other Ambulatory Visit: Payer: Self-pay | Admitting: Internal Medicine

## 2013-11-10 MED ORDER — LEVOTHYROXINE SODIUM 150 MCG PO TABS: 150.0000 ug | ORAL_TABLET | Freq: Every day | ORAL | Status: DC

## 2013-11-11 ENCOUNTER — Other Ambulatory Visit: Payer: Self-pay | Admitting: Internal Medicine

## 2013-12-01 ENCOUNTER — Other Ambulatory Visit: Payer: Self-pay | Admitting: Internal Medicine

## 2013-12-18 ENCOUNTER — Emergency Department (HOSPITAL_COMMUNITY): Admission: EM | Admit: 2013-12-18 | Discharge: 2013-12-18 | Payer: Self-pay

## 2013-12-21 ENCOUNTER — Ambulatory Visit: Payer: PRIVATE HEALTH INSURANCE | Attending: Internal Medicine | Admitting: *Deleted

## 2013-12-21 VITALS — BP 136/83 | HR 75 | Temp 98.8°F | Resp 18 | Ht 67.0 in | Wt 185.6 lb

## 2013-12-21 DIAGNOSIS — R110 Nausea: Secondary | ICD-10-CM

## 2013-12-21 MED ORDER — ONDANSETRON HCL 4 MG PO TABS: 4.0000 mg | ORAL_TABLET | Freq: Three times a day (TID) | ORAL | Status: DC | PRN

## 2013-12-21 NOTE — Progress Notes (Unsigned)
Patient in today with complaints of diarrhea and nausea. Patient states she was on antibiotic for her tooth and has finish the medication. Informed patient some antibiotic do cause diarrhea even after you finish treatment. Patient states this started on Friday.

## 2013-12-21 NOTE — Patient Instructions (Signed)
Nausea, Adult Nausea is the feeling that you have an upset stomach or have to vomit. Nausea by itself is not likely a serious concern, but it may be an early sign of more serious medical problems. As nausea gets worse, it can lead to vomiting. If vomiting develops, there is the risk of dehydration.  CAUSES   Viral infections.  Food poisoning.  Medicines.  Pregnancy.  Motion sickness.  Migraine headaches.  Emotional distress.  Severe pain from any source.  Alcohol intoxication. HOME CARE INSTRUCTIONS  Get plenty of rest.  Ask your caregiver about specific rehydration instructions.  Eat small amounts of food and sip liquids more often.  Take all medicines as told by your caregiver. SEEK MEDICAL CARE IF:  You have not improved after 2 days, or you get worse.  You have a headache. SEEK IMMEDIATE MEDICAL CARE IF:   You have a fever.  You faint.  You keep vomiting or have blood in your vomit.  You are extremely weak or dehydrated.  You have dark or bloody stools.  You have severe chest or abdominal pain. MAKE SURE YOU:  Understand these instructions.  Will watch your condition.  Will get help right away if you are not doing well or get worse. Document Released: 08/29/2004 Document Revised: 04/15/2012 Document Reviewed: 04/03/2011 [facility] Patient Information 2014 [place], [place]. Diarrhea Diarrhea is frequent loose and watery bowel movements. It can cause you to feel weak and dehydrated. Dehydration can cause you to become tired and thirsty, have a dry mouth, and have decreased urination that often is dark yellow. Diarrhea is a sign of another problem, most often an infection that will not last long. In most cases, diarrhea typically lasts 2 3 days. However, it can last longer if it is a sign of something more serious. It is important to treat your diarrhea as directed by your caregive to lessen or prevent future episodes of diarrhea. CAUSES  Some common causes  include:  Gastrointestinal infections caused by viruses, bacteria, or parasites.  Food poisoning or food allergies.  Certain medicines, such as antibiotics, chemotherapy, and laxatives.  Artificial sweeteners and fructose.  Digestive disorders. HOME CARE INSTRUCTIONS  Ensure adequate fluid intake (hydration): have 1 cup (8 oz) of fluid for each diarrhea episode. Avoid fluids that contain simple sugars or sports drinks, fruit juices, whole milk products, and sodas. Your urine should be clear or pale yellow if you are drinking enough fluids. Hydrate with an oral rehydration solution that you can purchase at pharmacies, retail stores, and online. You can prepare an oral rehydration solution at home by mixing the following ingredients together:    tsp table salt.   tsp baking soda.   tsp salt substitute containing potassium chloride.  1  tablespoons sugar.  1 L (34 oz) of water.  Certain foods and beverages may increase the speed at which food moves through the gastrointestinal (GI) tract. These foods and beverages should be avoided and include:  Caffeinated and alcoholic beverages.  High-fiber foods, such as raw fruits and vegetables, nuts, seeds, and whole grain breads and cereals.  Foods and beverages sweetened with sugar alcohols, such as xylitol, sorbitol, and mannitol.  Some foods may be well tolerated and may help thicken stool including:  Starchy foods, such as rice, toast, pasta, low-sugar cereal, oatmeal, grits, baked potatoes, crackers, and bagels.  Bananas.  Applesauce.  Add probiotic-rich foods to help increase healthy bacteria in the GI tract, such as yogurt and fermented milk products.  Wash  your hands well after each diarrhea episode.  Only take over-the-counter or prescription medicines as directed by your caregiver.  Take a warm bath to relieve any burning or pain from frequent diarrhea episodes. SEEK IMMEDIATE MEDICAL CARE IF:   You are unable to keep  fluids down.  You have persistent vomiting.  You have blood in your stool, or your stools are black and tarry.  You do not urinate in 6 8 hours, or there is only a small amount of very dark urine.  You have abdominal pain that increases or localizes.  You have weakness, dizziness, confusion, or lightheadedness.  You have a severe headache.  Your diarrhea gets worse or does not get better.  You have a fever or persistent symptoms for more than 2 3 days.  You have a fever and your symptoms suddenly get worse. MAKE SURE YOU:   Understand these instructions.  Will watch your condition.  Will get help right away if you are not doing well or get worse. Document Released: 07/12/2002 Document Revised: 07/08/2012 Document Reviewed: 03/29/2012 [facility] Patient Information 2014 [place], [place].  If you start to have severe abdominal cramping and rectal bleeding return to the clinic or go to the ER.

## 2014-01-01 ENCOUNTER — Emergency Department (HOSPITAL_COMMUNITY)
Admission: EM | Admit: 2014-01-01 | Discharge: 2014-01-01 | Disposition: A | Discharge status: Home / Self Care | Payer: PRIVATE HEALTH INSURANCE | Attending: Emergency Medicine | Admitting: Emergency Medicine

## 2014-01-01 ENCOUNTER — Encounter (HOSPITAL_COMMUNITY): Payer: Self-pay | Admitting: Emergency Medicine

## 2014-01-01 DIAGNOSIS — Z760 Encounter for issue of repeat prescription: Secondary | ICD-10-CM

## 2014-01-01 DIAGNOSIS — I10 Essential (primary) hypertension: Secondary | ICD-10-CM

## 2014-01-01 DIAGNOSIS — D649 Anemia, unspecified: Secondary | ICD-10-CM | POA: Insufficient documentation

## 2014-01-01 DIAGNOSIS — E079 Disorder of thyroid, unspecified: Secondary | ICD-10-CM | POA: Insufficient documentation

## 2014-01-01 DIAGNOSIS — Z79899 Other long term (current) drug therapy: Secondary | ICD-10-CM | POA: Insufficient documentation

## 2014-01-01 MED ORDER — ISOSORBIDE MONONITRATE ER 30 MG PO TB24: 30.0000 mg | ORAL_TABLET | Freq: Every day | ORAL | Status: DC

## 2014-01-01 NOTE — ED Provider Notes (Signed)
CSN: [id number]     Arrival date & time 01/01/14  1044 History  This chart was scribed for non-physician practitioner, Mercy Moore, PA-C,working with No att. providers found, by Marlowe Kays, ED Scribe.  This patient was seen in room TR07C/TR07C and the patient's care was started at 10:56 AM.  Chief Complaint  Patient presents with  . Medication Refill   The history is provided by the patient. No language interpreter was used.   HPI Comments:  Heidi Barrett is a 48 y.o. female who presents to the Emergency Department needing a refill on her blood pressure medication Isosorbide Mononitrate 30 mg daily. She states she goes to the [facility] and it is closed until Monday so she will not be able to get a refill until then. Pt states she has one tablet left to take today, but does not have any to take tomorrow. Has been taking medications without any missed doses. She denies any CP, HA, SOB, difficulty breathing, or dizziness.   Past Medical History  Diagnosis Date  . Thyroid disease   . Hypertension   . Anemia    History reviewed. No pertinent past surgical history. Family History  Problem Relation Age of Onset  . Heart disease Mother   . Stroke Mother   . Cancer Father     prostate   History  Substance Use Topics  . Smoking status: Never Smoker   . Smokeless tobacco: Never Used  . Alcohol Use: No   OB History   Grav Para Term Preterm Abortions TAB SAB Ect Mult Living   0              Review of Systems  Respiratory: Negative for shortness of breath.   Cardiovascular: Negative for chest pain.  Neurological: Negative for dizziness and headaches.  All other systems reviewed and are negative.   Allergies  Hydrocodone  Home Medications   Prior to Admission medications   Medication Sig Start Date End Date Taking? Authorizing Provider  amLODipine (NORVASC) 10 MG tablet One tab po for BP 09/02/13   Reyne Dumas, MD   amoxicillin-clavulanate (AUGMENTIN) 875-125 MG per tablet Take 1 tablet by mouth 2 (two) times daily. 09/02/13   Reyne Dumas, MD  aspirin EC 81 MG tablet Take 81 mg by mouth daily.    Historical Provider, MD  Calcium Carbonate (CALCIUM 600 PO) Take 600 mg by mouth daily.    Historical Provider, MD  Cholecalciferol (VITAMIN D PO) Take 1 tablet by mouth daily.    Historical Provider, MD  cyclobenzaprine (FLEXERIL) 5 MG tablet Take 1 tablet (5 mg total) by mouth 2 (two) times daily as needed for muscle spasms. May cause drowsiness 09/02/13   Reyne Dumas, MD  FERROUS SULFATE PO Take 1 tablet by mouth daily.    Historical Provider, MD  imiquimod (ALDARA) 5 % cream Apply topically 3 (three) times a week. 01/04/13   Lavonia Drafts, MD  isosorbide mononitrate (IMDUR) 30 MG 24 hr tablet Take 1 tablet (30 mg total) by mouth daily at 12 noon. 09/02/13   Reyne Dumas, MD  levothyroxine (SYNTHROID, LEVOTHROID) 150 MCG tablet Take 1 tablet (150 mcg total) by mouth daily before breakfast. 11/10/13   Angelica Chessman, MD  medroxyPROGESTERone (PROVERA) 10 MG tablet TAKE TWO TABLETS BY MOUTH ONCE DAILY    Angelica Chessman, MD  Multiple Vitamin (MULTIVITAMIN WITH MINERALS) TABS Take 1 tablet by mouth daily.    Historical Provider, MD  ondansetron (ZOFRAN ODT) 4  MG disintegrating tablet Take 1 tablet (4 mg total) by mouth every 8 (eight) hours as needed for nausea. 12/10/12   Larene Pickett, PA-C  ondansetron (ZOFRAN) 4 MG tablet Take 1 tablet (4 mg total) by mouth every 8 (eight) hours as needed for nausea or vomiting. 12/21/13   Angelica Chessman, MD  traMADol (ULTRAM) 50 MG tablet Take 1 tablet (50 mg total) by mouth every 6 (six) hours as needed. 09/02/13   Reyne Dumas, MD   Triage Vitals: BP 139/86  Pulse 69  Temp(Src) 98.5 F (36.9 C) (Oral)  Resp 20  Wt 185 lb (83.915 kg)  SpO2 100%  LMP 12/16/2013  Filed Vitals:   01/01/14 1046  BP: 139/86  Pulse: 69  Temp: 98.5 F (36.9 C)  TempSrc: Oral   Resp: 20  Weight: 185 lb (83.915 kg)  SpO2: 100%    Physical Exam  Nursing note and vitals reviewed. Constitutional: She is oriented to person, place, and time. She appears well-developed and well-nourished.  HENT:  Head: Normocephalic and atraumatic.  Eyes: Conjunctivae and EOM are normal. Right eye exhibits no discharge. Left eye exhibits no discharge.  Neck: Normal range of motion.  Cardiovascular: Normal rate, regular rhythm and normal heart sounds.  Exam reveals no gallop and no friction rub.   No murmur heard. Pulmonary/Chest: Effort normal and breath sounds normal. No respiratory distress. She has no wheezes. She has no rales. She exhibits no tenderness.  Abdominal: Soft. She exhibits no distension. There is no tenderness.  Musculoskeletal: Normal range of motion. She exhibits no edema and no tenderness.  Neurological: She is alert and oriented to person, place, and time.  Skin: Skin is warm and dry.  Psychiatric: She has a normal mood and affect. Her behavior is normal.    ED Course  Procedures (including critical care time) DIAGNOSTIC STUDIES: Oxygen Saturation is 100% on RA, normal by my interpretation.   COORDINATION OF CARE: 11:05 AM- Will refill her Imdur. Pt verbalizes understanding and agrees to plan.  Medications - No data to display  Labs Review Labs Reviewed - No data to display  Imaging Review No results found.   EKG Interpretation None      MDM   Heidi Barrett is a 48 y.o. female who presents to the Emergency Department needing a refill on her blood pressure medication Isosorbide Mononitrate 30 mg daily. Medication refilled. No concerns at this time. BP stable. Return precautions, discharge instructions, and follow-up was discussed with the patient before discharge.      Discharge Medication List as of 01/01/2014 11:11 AM    START taking these medications   Details  !! isosorbide mononitrate (IMDUR) 30 MG 24 hr tablet Take 1 tablet (30 mg  total) by mouth daily., Starting 01/01/2014, Until Discontinued, Print     !! - Potential duplicate medications found. Please discuss with provider.       Final impressions: 1. Encounter for medication refill   2. Hypertension       Mercy Moore PA-C   I personally performed the services described in this documentation, which was scribed in my presence. The recorded information has been reviewed and is accurate.    Lucila [place], PA-C 01/01/14 [phone]

## 2014-01-01 NOTE — ED Provider Notes (Signed)
Medical screening examination/treatment/procedure(s) were performed by non-physician practitioner and as supervising physician I was immediately available for consultation/collaboration.   EKG Interpretation None        Tuwana Kapaun N Roddrick Sharron, DO 01/01/14 1601 

## 2014-01-01 NOTE — Discharge Instructions (Signed)
Medication Refill, Emergency Department °We have refilled your medication today as a courtesy to you. It is best for your medical care, however, to take care of getting refills done through your primary caregiver's office. They have your records and can do a better job of follow-up than we can in the emergency department. °On maintenance medications, we often only prescribe enough medications to get you by until you are able to see your regular caregiver. This is a more expensive way to refill medications. °In the future, please plan for refills so that you will not have to use the emergency department for this. °Thank you for your help. Your help allows us to better take care of the daily emergencies that enter our department. °Document Released: 11/08/2003 Document Revised: 10/14/2011 Document Reviewed: 07/22/2005 °[employer]® Patient Information ©2014 [employer]. °Hypertension °As your heart beats, it forces blood through your arteries. This force is your blood pressure. If the pressure is too high, it is called hypertension (HTN) or high blood pressure. HTN is dangerous because you may have it and not know it. High blood pressure may mean that your heart has to work harder to pump blood. Your arteries may be narrow or stiff. The extra work puts you at risk for heart disease, stroke, and other problems.  °Blood pressure consists of two numbers, a higher number over a lower, 110/72, for example. It is stated as "110 over 72." The ideal is below 120 for the top number (systolic) and under 80 for the bottom (diastolic). Write down your blood pressure today. °You should pay close attention to your blood pressure if you have certain conditions such as: °· Heart failure. °· Prior heart attack. °· Diabetes °· Chronic kidney disease. °· Prior stroke. °· Multiple risk factors for heart disease. °To see if you have HTN, your blood pressure should be measured while you are seated with your arm held at the level of the  heart. It should be measured at least twice. A one-time elevated blood pressure reading (especially in the Emergency Department) does not mean that you need treatment. There may be conditions in which the blood pressure is different between your right and left arms. It is important to see your caregiver soon for a recheck. °Most people have essential hypertension which means that there is not a specific cause. This type of high blood pressure may be lowered by changing lifestyle factors such as: °· Stress. °· Smoking. °· Lack of exercise. °· Excessive weight. °· Drug/tobacco/alcohol use. °· Eating less salt. °Most people do not have symptoms from high blood pressure until it has caused damage to the body. Effective treatment can often prevent, delay or reduce that damage. °TREATMENT  °When a cause has been identified, treatment for high blood pressure is directed at the cause. There are a large number of medications to treat HTN. These fall into several categories, and your caregiver will help you select the medicines that are best for you. Medications may have side effects. You should review side effects with your caregiver. °If your blood pressure stays high after you have made lifestyle changes or started on medicines,  °· Your medication(s) may need to be changed. °· Other problems may need to be addressed. °· Be certain you understand your prescriptions, and know how and when to take your medicine. °· Be sure to follow up with your caregiver within the time frame advised (usually within two weeks) to have your blood pressure rechecked and to review your medications. °·   If you are taking more than one medicine to lower your blood pressure, make sure you know how and at what times they should be taken. Taking two medicines at the same time can result in blood pressure that is too low. SEEK IMMEDIATE MEDICAL CARE IF:  You develop a severe headache, blurred or changing vision, or confusion.  You have unusual  weakness or numbness, or a faint feeling.  You have severe chest or abdominal pain, vomiting, or breathing problems. MAKE SURE YOU:   Understand these instructions.  Will watch your condition.  Will get help right away if you are not doing well or get worse. Document Released: 07/22/2005 Document Revised: 10/14/2011 Document Reviewed: 03/11/2008 [facility] Patient Information 2014 [place].

## 2014-01-01 NOTE — ED Notes (Signed)
Per pt sts she is out of her isosorbide. [facility] unable to get until Monday. sts she has one left for today. No other complaints.

## 2014-01-31 ENCOUNTER — Other Ambulatory Visit: Payer: Self-pay | Admitting: Internal Medicine

## 2014-02-02 ENCOUNTER — Telehealth: Payer: Self-pay | Admitting: *Deleted

## 2014-02-02 NOTE — Telephone Encounter (Signed)
Patient called and stated that she needs a refill on her Provera.

## 2014-02-03 ENCOUNTER — Other Ambulatory Visit: Payer: Self-pay | Admitting: Obstetrics & Gynecology

## 2014-02-03 NOTE — Telephone Encounter (Signed)
It looks like pt already has a refill for this.  clh-S

## 2014-02-08 NOTE — Telephone Encounter (Signed)
Called patient and informed her there is a refill. Advised she call to schedule an appointment as she missed f/u appointment in March. Patient verbalized understanding and gratitude. No further questions or concerns.

## 2014-03-28 ENCOUNTER — Other Ambulatory Visit: Payer: Self-pay | Admitting: Internal Medicine

## 2014-03-29 ENCOUNTER — Other Ambulatory Visit: Payer: Self-pay | Admitting: Internal Medicine

## 2014-04-01 ENCOUNTER — Other Ambulatory Visit: Payer: Self-pay | Admitting: Internal Medicine

## 2014-04-01 ENCOUNTER — Telehealth: Payer: Self-pay | Admitting: *Deleted

## 2014-04-01 DIAGNOSIS — I10 Essential (primary) hypertension: Secondary | ICD-10-CM

## 2014-04-01 MED ORDER — ISOSORBIDE MONONITRATE ER 30 MG PO TB24: 30.0000 mg | ORAL_TABLET | Freq: Every day | ORAL | Status: DC

## 2014-04-01 NOTE — Telephone Encounter (Signed)
Returning patient call. Patient did not answer. Left a message that we will refill her Imdur this one time, she will need to make an appointment to see a provider, and it will be sent our [facility].

## 2014-04-06 ENCOUNTER — Telehealth: Payer: Self-pay | Admitting: General Practice

## 2014-04-06 NOTE — Telephone Encounter (Signed)
Patient called and left message stating she needs a refilled called in for her medroxy because she will run out before her appt on 10/14 and the pharmacy has tried to contact us multiple times for a refill but never hear back from [place]. [facility] and if she does not answer we can leave a message on her voicemail. Called patient stating I am returning your phone call. Discussed with patient that we would not refill her medication at this time since it has been over a year since we have seen her and have made refills before and she has missed appts with [place]. Also discussed that I [facility] back in July and she could contact them if she liked to obtain a refill until her appt with [place]. Patient verbalized understanding and states if she misses the medication and runs out is that okay. Told patient that she will be fine but it could cause her to have irregular bleeding. Patient verbalized understanding and had no other questions

## 2014-05-02 ENCOUNTER — Ambulatory Visit: Payer: PRIVATE HEALTH INSURANCE | Attending: Internal Medicine

## 2014-05-02 DIAGNOSIS — Z23 Encounter for immunization: Secondary | ICD-10-CM

## 2014-05-03 ENCOUNTER — Ambulatory Visit: Payer: PRIVATE HEALTH INSURANCE | Attending: Internal Medicine

## 2014-05-18 ENCOUNTER — Ambulatory Visit: Payer: PRIVATE HEALTH INSURANCE | Admitting: Family Medicine

## 2014-05-31 ENCOUNTER — Ambulatory Visit: Payer: Self-pay | Attending: Internal Medicine | Admitting: Internal Medicine

## 2014-05-31 ENCOUNTER — Encounter: Payer: Self-pay | Admitting: Internal Medicine

## 2014-05-31 VITALS — BP 145/91 | HR 70 | Temp 98.1°F | Resp 16 | Ht 67.5 in | Wt 186.0 lb

## 2014-05-31 DIAGNOSIS — I10 Essential (primary) hypertension: Secondary | ICD-10-CM | POA: Insufficient documentation

## 2014-05-31 DIAGNOSIS — D251 Intramural leiomyoma of uterus: Secondary | ICD-10-CM | POA: Insufficient documentation

## 2014-05-31 DIAGNOSIS — M722 Plantar fascial fibromatosis: Secondary | ICD-10-CM | POA: Insufficient documentation

## 2014-05-31 DIAGNOSIS — E039 Hypothyroidism, unspecified: Secondary | ICD-10-CM | POA: Insufficient documentation

## 2014-05-31 DIAGNOSIS — M25551 Pain in right hip: Secondary | ICD-10-CM

## 2014-05-31 DIAGNOSIS — E038 Other specified hypothyroidism: Secondary | ICD-10-CM

## 2014-05-31 DIAGNOSIS — G8929 Other chronic pain: Secondary | ICD-10-CM | POA: Insufficient documentation

## 2014-05-31 DIAGNOSIS — M1611 Unilateral primary osteoarthritis, right hip: Secondary | ICD-10-CM | POA: Insufficient documentation

## 2014-05-31 LAB — COMPLETE METABOLIC PANEL WITHOUT GFR
ALT: 21 U/L (ref 0–35)
AST: 16 U/L (ref 0–37)
Albumin: 4.1 g/dL (ref 3.5–5.2)
Alkaline Phosphatase: 38 U/L — ABNORMAL LOW (ref 39–117)
BUN: 13 mg/dL (ref 6–23)
CO2: 26 meq/L (ref 19–32)
Calcium: 8.8 mg/dL (ref 8.4–10.5)
Chloride: 103 meq/L (ref 96–112)
Creat: 0.88 mg/dL (ref 0.50–1.10)
GFR, Est African American: >89 mL/min
GFR, Est Non African American: 79 mL/min
Glucose, Bld: 70 mg/dL (ref 70–99)
Potassium: 4.2 meq/L (ref 3.5–5.3)
Sodium: 139 meq/L (ref 135–145)
Total Bilirubin: 0.7 mg/dL (ref 0.2–1.2)
Total Protein: 6.7 g/dL (ref 6.0–8.3)

## 2014-05-31 LAB — CBC WITH DIFFERENTIAL/PLATELET
Basophils Absolute: 0 10*3/uL (ref 0.0–0.1)
Basophils Relative: 0 % (ref 0–1)
EOS ABS: 0.2 10*3/uL (ref 0.0–0.7)
Eosinophils Relative: 6 % — ABNORMAL HIGH (ref 0–5)
HCT: 38.5 % (ref 36.0–46.0)
HEMOGLOBIN: 13.3 g/dL (ref 12.0–15.0)
Lymphocytes Relative: 36 % (ref 12–46)
Lymphs Abs: 1.4 10*3/uL (ref 0.7–4.0)
MCH: 31 pg (ref 26.0–34.0)
MCHC: 34.5 g/dL (ref 30.0–36.0)
MCV: 89.7 fL (ref 78.0–100.0)
MONO ABS: 0.3 10*3/uL (ref 0.1–1.0)
MONOS PCT: 9 % (ref 3–12)
NEUTROS PCT: 49 % (ref 43–77)
Neutro Abs: 1.9 10*3/uL (ref 1.7–7.7)
Platelets: 119 10*3/uL — ABNORMAL LOW (ref 150–400)
RBC: 4.29 MIL/uL (ref 3.87–5.11)
RDW: 12.6 % (ref 11.5–15.5)
WBC: 3.8 10*3/uL — ABNORMAL LOW (ref 4.0–10.5)

## 2014-05-31 LAB — LIPID PANEL
Cholesterol: 168 mg/dL (ref 0–200)
HDL: 66 mg/dL
LDL Cholesterol: 82 mg/dL (ref 0–99)
Total CHOL/HDL Ratio: 2.5 ratio
Triglycerides: 102 mg/dL
VLDL: 20 mg/dL (ref 0–40)

## 2014-05-31 LAB — T4, FREE: FREE T4: 1.42 ng/dL (ref 0.80–1.80)

## 2014-05-31 LAB — TSH: TSH: 3.197 u[IU]/mL (ref 0.350–4.500)

## 2014-05-31 LAB — POCT GLYCOSYLATED HEMOGLOBIN (HGB A1C): Hemoglobin A1C: 4.5

## 2014-05-31 MED ORDER — TRAMADOL HCL 50 MG PO TABS: 50.0000 mg | ORAL_TABLET | Freq: Four times a day (QID) | ORAL | Status: DC | PRN

## 2014-05-31 MED ORDER — ISOSORBIDE MONONITRATE ER 30 MG PO TB24
30.0000 mg | ORAL_TABLET | Freq: Every day | ORAL | Status: DC
Start: 2014-05-31 — End: 2015-01-16

## 2014-05-31 MED ORDER — VITAMIN D 1000 UNITS PO TABS: 1000.0000 [IU] | ORAL_TABLET | Freq: Every day | ORAL | Status: DC

## 2014-05-31 MED ORDER — CALCIUM CARBONATE 600 MG PO TABS: 600.0000 mg | ORAL_TABLET | Freq: Every day | ORAL | Status: AC

## 2014-05-31 MED ORDER — LEVOTHYROXINE SODIUM 150 MCG PO TABS: 150.0000 ug | ORAL_TABLET | Freq: Every day | ORAL | Status: DC

## 2014-05-31 MED ORDER — AMLODIPINE BESYLATE 10 MG PO TABS: ORAL_TABLET | ORAL | Status: DC

## 2014-05-31 MED ORDER — ADULT MULTIVITAMIN W/MINERALS CH: 1.0000 | ORAL_TABLET | Freq: Every day | ORAL | Status: AC

## 2014-05-31 NOTE — Patient Instructions (Signed)

## 2014-05-31 NOTE — Progress Notes (Signed)
Pt is here following up on her thyroid disease. Pt is requesting for refills of her medications. Pt is fasting wanting to get lab work. Pt states that she has occasional right hip pain.

## 2014-05-31 NOTE — Progress Notes (Signed)
Patient ID: Heidi Barrett, female   DOB: Feb 24, 1966, 48 y.o.   MRN: [id number]   Brynli Ollis, is a 48 y.o. female  LKG:[id number]  GUY:[id number]  DOB - 1965-11-12  Chief Complaint  Patient presents with  . Follow-up        Subjective:   Heidi Barrett is a 48 y.o. female here today for a follow up visit. Patient has medical history significant for hypertension, hypothyroidism and chronic anemia from dysfunctional uterine bleeding secondary to uterine fibroids, here today for routine follow-up. Patient has no new complaint today except for her chronic right hip pain from osteoarthritis. She is requesting medication refills today. She is compliant with medication with no side effects reported. Patient has No headache, No chest pain, No abdominal pain - No Nausea, No new weakness tingling or numbness, No Cough - SOB.  Problem  Benign Essential Htn  Hip Pain  Other Specified Hypothyroidism  Intramural Leiomyoma of Uterus    ALLERGIES: Allergies  Allergen Reactions  . Hydrocodone Nausea And Vomiting    dizzines    PAST MEDICAL HISTORY: Past Medical History  Diagnosis Date  . Thyroid disease   . Hypertension   . Anemia     MEDICATIONS AT HOME: Prior to Admission medications   Medication Sig Start Date End Date Taking? Authorizing Provider  amLODipine (NORVASC) 10 MG tablet One tab po for BP 05/31/14  Yes [name], MD  calcium carbonate (CALCIUM 600) 600 MG TABS tablet Take 1 tablet (600 mg total) by mouth daily with breakfast. 05/31/14  Yes [name], MD  cholecalciferol (VITAMIN D) 1000 UNITS tablet Take 1 tablet (1,000 Units total) by mouth daily. 05/31/14  Yes [name], MD  isosorbide mononitrate (IMDUR) 30 MG 24 hr tablet Take 1 tablet (30 mg total) by mouth daily. 05/31/14  Yes [name], MD  levothyroxine (SYNTHROID, LEVOTHROID) 150 MCG tablet Take 1 tablet (150 mcg total) by mouth daily before breakfast. 05/31/14  Yes  [name], MD  Multiple Vitamin (MULTIVITAMIN WITH MINERALS) TABS tablet Take 1 tablet by mouth daily. 05/31/14  Yes [name], MD  imiquimod (ALDARA) 5 % cream Apply topically 3 (three) times a week. 01/04/13   Lavonia Drafts, MD  traMADol (ULTRAM) 50 MG tablet Take 1 tablet (50 mg total) by mouth every 6 (six) hours as needed. 05/31/14   [name], MD     Objective:   Filed Vitals:   05/31/14 1205  BP: 145/91  Pulse: 70  Temp: 98.1 F (36.7 C)  TempSrc: Oral  Resp: 16  Height: 5' 7.5" (1.715 m)  Weight: 186 lb (84.369 kg)  SpO2: 100%    Exam General appearance : Awake, alert, not in any distress. Speech Clear. Not toxic looking HEENT: Atraumatic and Normocephalic, pupils equally reactive to light and accomodation Neck: supple, no JVD. No cervical lymphadenopathy.  Chest:Good air entry bilaterally, no added sounds  CVS: S1 S2 regular, no murmurs.  Abdomen: Bowel sounds present, Non tender and not distended with no gaurding, rigidity or rebound. Extremities: B/L Lower Ext shows no edema, both legs are warm to touch Neurology: Awake alert, and oriented X 3, CN II-XII intact, Non focal Skin:No Rash Wounds:N/A  Data Review No results found for this basename: HGBA1C     Assessment & Plan   1. Benign essential HTN  - amLODipine (NORVASC) 10 MG tablet; One tab po for BP  Dispense: 90 tablet; Refill: 3 - isosorbide mononitrate (IMDUR) 30 MG  24 hr tablet; Take 1 tablet (30 mg total) by mouth daily.  Dispense: 90 tablet; Refill: 3  - CBC with Differential - COMPLETE METABOLIC PANEL WITH GFR - POCT glycosylated hemoglobin (Hb A1C) - Lipid panel - Urinalysis, Complete - Vit D  25 hydroxy (rtn osteoporosis monitoring)  - We have discussed target BP range and blood pressure goal - I have advised patient to check BP regularly and to call us back or report to clinic if the numbers are consistently higher than 140/90  - We discussed the  importance of compliance with medical therapy and DASH diet recommended, consequences of uncontrolled hypertension discussed.  - continue current BP medications  2. Hip pain, right  - calcium carbonate (CALCIUM 600) 600 MG TABS tablet; Take 1 tablet (600 mg total) by mouth daily with breakfast.  Dispense: 60 tablet; Refill: 3 - cholecalciferol (VITAMIN D) 1000 UNITS tablet; Take 1 tablet (1,000 Units total) by mouth daily.  Dispense: 90 tablet; Refill: 3 - Multiple Vitamin (MULTIVITAMIN WITH MINERALS) TABS tablet; Take 1 tablet by mouth daily.  Dispense: 90 tablet; Refill: 3 - traMADol (ULTRAM) 50 MG tablet; Take 1 tablet (50 mg total) by mouth every 6 (six) hours as needed.  Dispense: 60 tablet; Refill: 0  3. Other specified hypothyroidism  - levothyroxine (SYNTHROID, LEVOTHROID) 150 MCG tablet; Take 1 tablet (150 mcg total) by mouth daily before breakfast.  Dispense: 90 tablet; Refill: 3 - TSH - T4, Free  4. Intramural leiomyoma of uterus  - US Pelvis Complete; Future - US Transvaginal Non-OB; Future  Return in about 6 months (around 11/30/2014), or if symptoms worsen or fail to improve, for Hypothyroidism, Follow up HTN.  The patient was given clear instructions to go to ER or return to medical center if symptoms don't improve, worsen or new problems develop. The patient verbalized understanding. The patient was told to call to get lab results if they haven't heard anything in the next week.   This note has been created with Surveyor, quantity. Any transcriptional errors are unintentional.    Angelica Chessman, MD, [place], [place], [place] and [place], [place]   05/31/2014, 12:49 PM

## 2014-06-01 LAB — URINALYSIS, COMPLETE
Bacteria, UA: NONE SEEN
Bilirubin Urine: NEGATIVE
CASTS: NONE SEEN
Crystals: NONE SEEN
Glucose, UA: NEGATIVE mg/dL
Hgb urine dipstick: NEGATIVE
Ketones, ur: NEGATIVE mg/dL
Leukocytes, UA: NEGATIVE
Nitrite: NEGATIVE
PH: 7.5 (ref 5.0–8.0)
PROTEIN: NEGATIVE mg/dL
Specific Gravity, Urine: 1.011 (ref 1.005–1.030)
Urobilinogen, UA: 0.2 mg/dL (ref 0.0–1.0)

## 2014-06-01 LAB — VITAMIN D 25 HYDROXY (VIT D DEFICIENCY, FRACTURES): Vit D, 25-Hydroxy: 44 ng/mL (ref 30–89)

## 2014-06-06 ENCOUNTER — Ambulatory Visit (HOSPITAL_COMMUNITY): Payer: Self-pay

## 2014-06-20 ENCOUNTER — Telehealth: Payer: Self-pay | Admitting: Emergency Medicine

## 2014-06-20 NOTE — Telephone Encounter (Signed)
-----   Message from Heidi Garter, MD sent at 06/12/2014  3:37 PM EST ----- Please inform patient that her laboratory test results are all within normal limit

## 2014-06-20 NOTE — Telephone Encounter (Signed)
Left message for pt to call for lab results 

## 2014-06-21 ENCOUNTER — Telehealth: Payer: Self-pay | Admitting: Emergency Medicine

## 2014-06-21 NOTE — Telephone Encounter (Signed)
Pt given normal lab results 

## 2014-07-04 ENCOUNTER — Ambulatory Visit: Payer: PRIVATE HEALTH INSURANCE | Admitting: Obstetrics & Gynecology

## 2014-10-19 ENCOUNTER — Ambulatory Visit: Payer: Self-pay

## 2014-11-16 ENCOUNTER — Ambulatory Visit: Payer: Self-pay | Attending: Internal Medicine

## 2015-01-11 ENCOUNTER — Ambulatory Visit: Payer: Self-pay | Attending: Internal Medicine

## 2015-01-12 ENCOUNTER — Ambulatory Visit: Payer: Self-pay | Admitting: Internal Medicine

## 2015-01-16 ENCOUNTER — Encounter: Payer: Self-pay | Admitting: Internal Medicine

## 2015-01-16 ENCOUNTER — Ambulatory Visit: Payer: Self-pay | Attending: Internal Medicine | Admitting: Internal Medicine

## 2015-01-16 VITALS — BP 151/92 | HR 81 | Temp 98.2°F | Resp 18 | Wt 185.0 lb

## 2015-01-16 DIAGNOSIS — M7732 Calcaneal spur, left foot: Secondary | ICD-10-CM | POA: Insufficient documentation

## 2015-01-16 DIAGNOSIS — M25551 Pain in right hip: Secondary | ICD-10-CM | POA: Insufficient documentation

## 2015-01-16 DIAGNOSIS — I10 Essential (primary) hypertension: Secondary | ICD-10-CM | POA: Insufficient documentation

## 2015-01-16 DIAGNOSIS — M7730 Calcaneal spur, unspecified foot: Secondary | ICD-10-CM | POA: Insufficient documentation

## 2015-01-16 DIAGNOSIS — E038 Other specified hypothyroidism: Secondary | ICD-10-CM | POA: Insufficient documentation

## 2015-01-16 LAB — CBC WITH DIFFERENTIAL/PLATELET
Basophils Absolute: 0 10*3/uL (ref 0.0–0.1)
Basophils Relative: 0 % (ref 0–1)
Eosinophils Absolute: 0.2 10*3/uL (ref 0.0–0.7)
Eosinophils Relative: 5 % (ref 0–5)
HCT: 37.4 % (ref 36.0–46.0)
HEMOGLOBIN: 12.9 g/dL (ref 12.0–15.0)
LYMPHS ABS: 1.1 10*3/uL (ref 0.7–4.0)
LYMPHS PCT: 30 % (ref 12–46)
MCH: 30.5 pg (ref 26.0–34.0)
MCHC: 34.5 g/dL (ref 30.0–36.0)
MCV: 88.4 fL (ref 78.0–100.0)
MPV: 11.8 fL (ref 8.6–12.4)
Monocytes Absolute: 0.4 10*3/uL (ref 0.1–1.0)
Monocytes Relative: 10 % (ref 3–12)
NEUTROS ABS: 2.1 10*3/uL (ref 1.7–7.7)
NEUTROS PCT: 55 % (ref 43–77)
Platelets: 146 10*3/uL — ABNORMAL LOW (ref 150–400)
RBC: 4.23 MIL/uL (ref 3.87–5.11)
RDW: 13.5 % (ref 11.5–15.5)
WBC: 3.8 10*3/uL — AB (ref 4.0–10.5)

## 2015-01-16 LAB — COMPLETE METABOLIC PANEL WITH GFR
ALBUMIN: 4.1 g/dL (ref 3.5–5.2)
ALT: 13 U/L (ref 0–35)
AST: 13 U/L (ref 0–37)
Alkaline Phosphatase: 34 U/L — ABNORMAL LOW (ref 39–117)
BUN: 14 mg/dL (ref 6–23)
CO2: 27 meq/L (ref 19–32)
Calcium: 9.4 mg/dL (ref 8.4–10.5)
Chloride: 106 mEq/L (ref 96–112)
Creat: 0.82 mg/dL (ref 0.50–1.10)
GFR, EST NON AFRICAN AMERICAN: 85 mL/min
GLUCOSE: 84 mg/dL (ref 70–99)
POTASSIUM: 4.2 meq/L (ref 3.5–5.3)
SODIUM: 143 meq/L (ref 135–145)
Total Bilirubin: 0.6 mg/dL (ref 0.2–1.2)
Total Protein: 6.8 g/dL (ref 6.0–8.3)

## 2015-01-16 LAB — LIPID PANEL
CHOLESTEROL: 229 mg/dL — AB (ref 0–200)
HDL: 88 mg/dL (ref >=46)
LDL Cholesterol: 132 mg/dL — ABNORMAL HIGH (ref 0–99)
Total CHOL/HDL Ratio: 2.6 Ratio
Triglycerides: 44 mg/dL (ref <150)
VLDL: 9 mg/dL (ref 0–40)

## 2015-01-16 LAB — TSH: TSH: 1.64 u[IU]/mL (ref 0.350–4.500)

## 2015-01-16 LAB — T4, FREE: Free T4: 1.4 ng/dL (ref 0.80–1.80)

## 2015-01-16 MED ORDER — HYDROCHLOROTHIAZIDE 12.5 MG PO TABS: 12.5000 mg | ORAL_TABLET | Freq: Every day | ORAL | Status: DC

## 2015-01-16 MED ORDER — IBUPROFEN 800 MG PO TABS: 800.0000 mg | ORAL_TABLET | Freq: Three times a day (TID) | ORAL | Status: DC | PRN

## 2015-01-16 MED ORDER — LEVOTHYROXINE SODIUM 150 MCG PO TABS: 150.0000 ug | ORAL_TABLET | Freq: Every day | ORAL | Status: DC

## 2015-01-16 MED ORDER — ISOSORBIDE MONONITRATE ER 30 MG PO TB24: 30.0000 mg | ORAL_TABLET | Freq: Every day | ORAL | Status: DC

## 2015-01-16 MED ORDER — TRAMADOL HCL 50 MG PO TABS: 50.0000 mg | ORAL_TABLET | Freq: Four times a day (QID) | ORAL | Status: DC | PRN

## 2015-01-16 MED ORDER — AMLODIPINE BESYLATE 10 MG PO TABS: ORAL_TABLET | ORAL | Status: DC

## 2015-01-16 NOTE — Patient Instructions (Signed)
Heel Spur A heel spur is a hook of bone that can form on the calcaneus (the heel bone and the largest bone of the foot). Heel spurs are often associated with plantar fasciitis and usually come in people who have had the problem for an extended period of time. The cause of the relationship is unknown. The pain associated with them is thought to be caused by an inflammation (soreness and redness) of the plantar fascia rather than the spur itself. The plantar fascia is a thick fibrous like tissue that runs from the calcaneus (heel bone) to the ball of the foot. This strong, tight tissue helps maintain the arch of your foot. It helps distribute the weight across your foot as you walk or run. Stresses placed on the plantar fascia can be tremendous. When it is inflamed normal activities become painful. Pain is worse in the morning after sleeping. After sleeping the plantar fascia is tight. The first movements stretch the fascia and this causes pain. As the tendon loosens, the pain usually gets better. It often returns with too much standing or walking.  About 70% of patients with plantar fasciitis have a heel spur. About half of people without foot pain also have heel spurs. DIAGNOSIS  The diagnosis of a heel spur is made by X-ray. The X-ray shows a hook of bone protruding from the bottom of the calcaneus at the point where the plantar fascia is attached to the heel bone.  TREATMENT  It is necessary to find out what is causing the stretching of the plantar fascia. If the cause is over-pronation (flat feet), orthotics and proper foot ware may help.  Stretching exercises, losing weight, wearing shoes that have a cushioned heel that absorbs shock, and elevating the heel with the use of a heel cradle, heel cup, or orthotics may all help. Heel cradles and heel cups provide extra comfort and cushion to the heel, and reduce the amount of shock to the sore area. AVOIDING THE PAIN OF PLANTAR FASCIITIS AND HEEL  SPURS  Consult a sports medicine professional before beginning a new exercise program.  Walking programs offer a good workout. There is a lower chance of overuse injuries common to the runners. There is less impact and less jarring of the joints.  Begin all new exercise programs slowly. If problems or pains develop, decrease the amount of time or distance until you are at a comfortable level.  Wear good shoes and replace them regularly.  Stretch your foot and the heel cords at the back of the ankle (Achilles tendons) both before and after exercise.  Run or exercise on even surfaces that are not hard. For example, asphalt is better than pavement.  Do not run barefoot on hard surfaces.  If using a treadmill, vary the incline.  Do not continue to workout if you have foot or joint problems. Seek professional help if they do not improve. HOME CARE INSTRUCTIONS   Avoid activities that cause you pain until you recover.  Use ice or cold packs to the problem or painful areas after working out.  Only take over-the-counter or prescription medicines for pain, discomfort, or fever as directed by your caregiver.  Soft shoe inserts or athletic shoes with air or gel sole cushions may be helpful.  If problems continue or become more severe, consult a sports medicine caregiver. Cortisone is a potent anti-inflammatory medication that may be injected into the painful area. You can discuss this treatment with your caregiver. MAKE SURE YOU:  Understand these instructions.  Will watch your condition.  Will get help right away if you are not doing well or get worse. Document Released: 08/28/2005 Document Revised: 10/14/2011 Document Reviewed: 09/22/2013 [facility] Patient Information 2015 [place], [place]. This information is not intended to replace advice given to you by your health care provider. Make sure you discuss any questions you have with your health care provider. DASH Eating Plan DASH stands  for "Dietary Approaches to Stop Hypertension." The DASH eating plan is a healthy eating plan that has been shown to reduce high blood pressure (hypertension). Additional health benefits may include reducing the risk of type 2 diabetes mellitus, heart disease, and stroke. The DASH eating plan may also help with weight loss. WHAT DO I NEED TO KNOW ABOUT THE DASH EATING PLAN? For the DASH eating plan, you will follow these general guidelines:  Choose foods with a percent daily value for sodium of less than 5% (as listed on the food label).  Use salt-free seasonings or herbs instead of table salt or sea salt.  Check with your health care provider or pharmacist before using salt substitutes.  Eat lower-sodium products, often labeled as "lower sodium" or "no salt added."  Eat fresh foods.  Eat more vegetables, fruits, and low-fat dairy products.  Choose whole grains. Look for the word "whole" as the first word in the ingredient list.  Choose fish and skinless chicken or [place] more often than red meat. Limit fish, poultry, and meat to 6 oz (170 g) each day.  Limit sweets, desserts, sugars, and sugary drinks.  Choose heart-healthy fats.  Limit cheese to 1 oz (28 g) per day.  Eat more home-cooked food and less restaurant, buffet, and fast food.  Limit fried foods.  Cook foods using methods other than frying.  Limit canned vegetables. If you do use them, rinse them well to decrease the sodium.  When eating at a restaurant, ask that your food be prepared with less salt, or no salt if possible. WHAT FOODS CAN I EAT? Seek help from a dietitian for individual calorie needs. Grains Whole grain or whole wheat bread. Brown rice. Whole grain or whole wheat pasta. Quinoa, bulgur, and whole grain cereals. Low-sodium cereals. Corn or whole wheat flour tortillas. Whole grain cornbread. Whole grain crackers. Low-sodium crackers. Vegetables Fresh or frozen vegetables (raw, steamed, roasted, or  grilled). Low-sodium or reduced-sodium tomato and vegetable juices. Low-sodium or reduced-sodium tomato sauce and paste. Low-sodium or reduced-sodium canned vegetables.  Fruits All fresh, canned (in natural juice), or frozen fruits. Meat and Other Protein Products Ground beef (85% or leaner), grass-fed beef, or beef trimmed of fat. Skinless chicken or [place]. Ground chicken or [place]. Pork trimmed of fat. All fish and seafood. Eggs. Dried beans, peas, or lentils. Unsalted nuts and seeds. Unsalted canned beans. Dairy Low-fat dairy products, such as skim or 1% milk, 2% or reduced-fat cheeses, low-fat ricotta or cottage cheese, or plain low-fat yogurt. Low-sodium or reduced-sodium cheeses. Fats and Oils Tub margarines without trans fats. Light or reduced-fat mayonnaise and salad dressings (reduced sodium). Avocado. Safflower, olive, or canola oils. Natural peanut or almond butter. Other Unsalted popcorn and pretzels. The items listed above may not be a complete list of recommended foods or beverages. Contact your dietitian for more options. WHAT FOODS ARE NOT RECOMMENDED? Grains White bread. White pasta. White rice. Refined cornbread. Bagels and croissants. Crackers that contain trans fat. Vegetables Creamed or fried vegetables. Vegetables in a cheese sauce. Regular canned vegetables. Regular canned tomato  sauce and paste. Regular tomato and vegetable juices. Fruits Dried fruits. Canned fruit in light or heavy syrup. Fruit juice. Meat and Other Protein Products Fatty cuts of meat. Ribs, chicken wings, bacon, sausage, bologna, salami, chitterlings, fatback, hot dogs, bratwurst, and packaged luncheon meats. Salted nuts and seeds. Canned beans with salt. Dairy Whole or 2% milk, cream, half-and-half, and cream cheese. Whole-fat or sweetened yogurt. Full-fat cheeses or blue cheese. Nondairy creamers and whipped toppings. Processed cheese, cheese spreads, or cheese curds. Condiments Onion and garlic  salt, seasoned salt, table salt, and sea salt. Canned and packaged gravies. Worcestershire sauce. Tartar sauce. Barbecue sauce. Teriyaki sauce. Soy sauce, including reduced sodium. Steak sauce. Fish sauce. Oyster sauce. Cocktail sauce. Horseradish. Ketchup and mustard. Meat flavorings and tenderizers. Bouillon cubes. Hot sauce. Tabasco sauce. Marinades. Taco seasonings. Relishes. Fats and Oils Butter, stick margarine, lard, shortening, ghee, and bacon fat. Coconut, palm kernel, or palm oils. Regular salad dressings. Other Pickles and olives. Salted popcorn and pretzels. The items listed above may not be a complete list of foods and beverages to avoid. Contact your dietitian for more information. WHERE CAN I FIND MORE INFORMATION? National Heart, Lung, and Blood Institute: travelstabloid.com Document Released: 07/11/2011 Document Revised: 12/06/2013 Document Reviewed: 05/26/2013 [facility] Patient Information 2015 [place], [place]. This information is not intended to replace advice given to you by your health care provider. Make sure you discuss any questions you have with your health care provider. Hypertension Hypertension, commonly called high blood pressure, is when the force of blood pumping through your arteries is too strong. Your arteries are the blood vessels that carry blood from your heart throughout your body. A blood pressure reading consists of a higher number over a lower number, such as 110/72. The higher number (systolic) is the pressure inside your arteries when your heart pumps. The lower number (diastolic) is the pressure inside your arteries when your heart relaxes. Ideally you want your blood pressure below 120/80. Hypertension forces your heart to work harder to pump blood. Your arteries may become narrow or stiff. Having hypertension puts you at risk for heart disease, stroke, and other problems.  RISK FACTORS Some risk factors for high blood  pressure are controllable. Others are not.  Risk factors you cannot control include:   Race. You may be at higher risk if you are African American.  Age. Risk increases with age.  Gender. Men are at higher risk than women before age 2 years. After age 54, women are at higher risk than men. Risk factors you can control include:  Not getting enough exercise or physical activity.  Being overweight.  Getting too much fat, sugar, calories, or salt in your diet.  Drinking too much alcohol. SIGNS AND SYMPTOMS Hypertension does not usually cause signs or symptoms. Extremely high blood pressure (hypertensive crisis) may cause headache, anxiety, shortness of breath, and nosebleed. DIAGNOSIS  To check if you have hypertension, your health care provider will measure your blood pressure while you are seated, with your arm held at the level of your heart. It should be measured at least twice using the same arm. Certain conditions can cause a difference in blood pressure between your right and left arms. A blood pressure reading that is higher than normal on one occasion does not mean that you need treatment. If one blood pressure reading is high, ask your health care provider about having it checked again. TREATMENT  Treating high blood pressure includes making lifestyle changes and possibly taking medicine. Living a  healthy lifestyle can help lower high blood pressure. You may need to change some of your habits. Lifestyle changes may include:  Following the DASH diet. This diet is high in fruits, vegetables, and whole grains. It is low in salt, red meat, and added sugars.  Getting at least 2 hours of brisk physical activity every week.  Losing weight if necessary.  Not smoking.  Limiting alcoholic beverages.  Learning ways to reduce stress. If lifestyle changes are not enough to get your blood pressure under control, your health care provider may prescribe medicine. You may need to take  more than one. Work closely with your health care provider to understand the risks and benefits. HOME CARE INSTRUCTIONS  Have your blood pressure rechecked as directed by your health care provider.   Take medicines only as directed by your health care provider. Follow the directions carefully. Blood pressure medicines must be taken as prescribed. The medicine does not work as well when you skip doses. Skipping doses also puts you at risk for problems.   Do not smoke.   Monitor your blood pressure at home as directed by your health care provider. SEEK MEDICAL CARE IF:   You think you are having a reaction to medicines taken.  You have recurrent headaches or feel dizzy.  You have swelling in your ankles.  You have trouble with your vision. SEEK IMMEDIATE MEDICAL CARE IF:  You develop a severe headache or confusion.  You have unusual weakness, numbness, or feel faint.  You have severe chest or abdominal pain.  You vomit repeatedly.  You have trouble breathing. MAKE SURE YOU:   Understand these instructions.  Will watch your condition.  Will get help right away if you are not doing well or get worse. Document Released: 07/22/2005 Document Revised: 12/06/2013 Document Reviewed: 05/14/2013 [facility] Patient Information 2015 [place], [place]. This information is not intended to replace advice given to you by your health care provider. Make sure you discuss any questions you have with your health care provider.
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
## 2015-01-16 NOTE — Progress Notes (Signed)
C/o left heel pain x2 weeks; denies inj/trauma Also c/o right hip pain onset 1 month Pain increases w/activity Alert, no signs of acute distress.

## 2015-01-17 ENCOUNTER — Telehealth: Payer: Self-pay

## 2015-01-17 LAB — URINALYSIS, COMPLETE
Bilirubin Urine: NEGATIVE
Casts: NONE SEEN
Crystals: NONE SEEN
Glucose, UA: NEGATIVE mg/dL
HGB URINE DIPSTICK: NEGATIVE
Ketones, ur: NEGATIVE mg/dL
LEUKOCYTES UA: NEGATIVE
Nitrite: NEGATIVE
PH: 7 (ref 5.0–8.0)
Protein, ur: NEGATIVE mg/dL
Specific Gravity, Urine: 1.009 (ref 1.005–1.030)
UROBILINOGEN UA: 0.2 mg/dL (ref 0.0–1.0)

## 2015-01-17 NOTE — Telephone Encounter (Signed)
-----   Message from [name], MD sent at 01/17/2015 12:21 PM EDT ----- Please inform patient that her laboratory test results are normal except for cholesterol level that is slightly high,To address this please limit saturated fat to no more than 7% of your calories, limit cholesterol to 200 mg/day, increase fiber and exercise as tolerated. If needed we may add another cholesterol lowering medication to your regimen.

## 2015-01-17 NOTE — Telephone Encounter (Signed)
Nurse called patient, patient verified date of birth. Patient aware of normal lab results except cholesterol level is slightly high. Patient agrees to limit saturated fat to 7% of her calories, limit cholesterol to 200mg /day, and increase fiber and exercise as tolerated. Patient also aware of possibly adding another cholesterol lowering medication to her regimen. Patient voices understanding and has no questions at this time.

## 2015-01-20 ENCOUNTER — Other Ambulatory Visit: Payer: Self-pay | Admitting: Internal Medicine

## 2015-01-20 DIAGNOSIS — E038 Other specified hypothyroidism: Secondary | ICD-10-CM

## 2015-01-20 MED ORDER — LEVOTHYROXINE SODIUM 150 MCG PO TABS: 150.0000 ug | ORAL_TABLET | Freq: Every day | ORAL | Status: DC

## 2015-01-27 ENCOUNTER — Telehealth: Payer: Self-pay

## 2015-01-27 ENCOUNTER — Telehealth: Payer: Self-pay | Admitting: Internal Medicine

## 2015-01-27 ENCOUNTER — Ambulatory Visit: Payer: Self-pay | Attending: Internal Medicine | Admitting: Internal Medicine

## 2015-01-27 ENCOUNTER — Other Ambulatory Visit: Payer: Self-pay

## 2015-01-27 ENCOUNTER — Encounter: Payer: Self-pay | Admitting: Internal Medicine

## 2015-01-27 VITALS — BP 116/74 | HR 79 | Temp 98.2°F | Resp 18 | Ht 67.0 in | Wt 181.2 lb

## 2015-01-27 DIAGNOSIS — R51 Headache: Secondary | ICD-10-CM | POA: Insufficient documentation

## 2015-01-27 DIAGNOSIS — E079 Disorder of thyroid, unspecified: Secondary | ICD-10-CM | POA: Insufficient documentation

## 2015-01-27 DIAGNOSIS — T502X5A Adverse effect of carbonic-anhydrase inhibitors, benzothiadiazides and other diuretics, initial encounter: Secondary | ICD-10-CM | POA: Insufficient documentation

## 2015-01-27 DIAGNOSIS — Z889 Allergy status to unspecified drugs, medicaments and biological substances status: Secondary | ICD-10-CM

## 2015-01-27 DIAGNOSIS — M542 Cervicalgia: Secondary | ICD-10-CM | POA: Insufficient documentation

## 2015-01-27 DIAGNOSIS — R0602 Shortness of breath: Secondary | ICD-10-CM | POA: Insufficient documentation

## 2015-01-27 DIAGNOSIS — I10 Essential (primary) hypertension: Secondary | ICD-10-CM | POA: Insufficient documentation

## 2015-01-27 DIAGNOSIS — R0789 Other chest pain: Secondary | ICD-10-CM | POA: Insufficient documentation

## 2015-01-27 DIAGNOSIS — T7840XA Allergy, unspecified, initial encounter: Secondary | ICD-10-CM

## 2015-01-27 MED ORDER — SODIUM CHLORIDE 0.9 % IJ SOLN
1000.0000 mL | Freq: Once | INTRAMUSCULAR | Status: AC
  Administered 2015-01-27: 1000 mL via INTRAVENOUS

## 2015-01-27 MED ORDER — METHYLPREDNISOLONE SODIUM SUCC 125 MG IJ SOLR
60.0000 mg | Freq: Once | INTRAMUSCULAR | Status: AC
  Administered 2015-01-27: 60 mg via INTRAMUSCULAR

## 2015-01-27 MED ORDER — PREDNISONE 20 MG PO TABS: 20.0000 mg | ORAL_TABLET | Freq: Every day | ORAL | Status: DC

## 2015-01-27 NOTE — Patient Instructions (Signed)

## 2015-01-27 NOTE — Progress Notes (Signed)
Patient here due to allergic reaction. Patient has been taking hydrochlorothiazide and ibuprofen instead of tramadol and feel that is what caused the reaction. Patient took hydrochlorothiazide around 8am. Patient has tightness in chest, SOB, pain and tightness in neck, headache, sores on back of tongue, burning in throat, and hoarseness. Patient had rapid heart beat yesterday while sleeping.  Patient noticed sores last night while brushing teeth and they began to bleed. Patient started taking hydrochlorothiazide on 01/17/15. Patient was taking tramadol but stop on the 14 or 15th because it made her vomit. Tightness of chest started on 01/17/15 but would come and go and now has gotten worse.   Patient also took amlodipine, isosorbide, levothyroxine this morning.

## 2015-01-27 NOTE — Telephone Encounter (Signed)
Please see previous documentation

## 2015-01-27 NOTE — Telephone Encounter (Signed)
Patient called requesting to speak to nurse regarding medication reaction, patient states her throat feels swollen and she thinks it's the medication. Please f/u with patient

## 2015-01-27 NOTE — Telephone Encounter (Signed)
Patient calls in explaining chest tightness and voice hoarseness. Patient explains symptoms have been going on for several days. Patient reports sometimes she has difficulty breathing, "it comes and goes". Patient reports being stable at this time. Nurse advised patient to go to urgent care or ED. Patient voices understanding and has no questions at this time. Nurse advised patient to call 911 if she is having chest pains or difficulty breathing. Patient reports "I feel stable right now, I think I can drive.".

## 2015-01-27 NOTE — Progress Notes (Signed)
Patient ID: Heidi Barrett, female   DOB: Nov 15, 1965, 49 y.o.   MRN: [id number]   Heidi Barrett, is a 49 y.o. female  [id number]  [id number]  DOB - Jan 10, 1966  Chief Complaint  Patient presents with  . Allergic Reaction        Subjective:   Heidi Barrett is a 49 y.o. female here today for a follow up visit. Patient was called in today after reporting some shortness of breath and feeling of lump in her throat after taking hydrochlorothiazide. She took hydrochlorothiazide around 8 AM and complained of tightness in chest, shortness of breath, pain and neck stiffness as well as headache and feeling of sore throat and hoarseness. Patient was prescribed hydrochlorothiazide in conjunction with other antihypertensives for better control but she only started taking it this morning. She has no itching, no rash, no conjunctival redness. When seen in the clinic today, lumpiness in the throat is resolved, shortness of breath is improving. Her medical history includes hypertension, chronic anemia and thyroid disease. Patient has No headache, No chest pain, No abdominal pain - No Nausea, No new weakness tingling or numbness, No Cough - SOB.  Problem  Allergic Reaction Caused By A Drug    ALLERGIES: Allergies  Allergen Reactions  . Hydrocodone Nausea And Vomiting    dizzines    PAST MEDICAL HISTORY: Past Medical History  Diagnosis Date  . Thyroid disease   . Hypertension   . Anemia     MEDICATIONS AT HOME: Prior to Admission medications   Medication Sig Start Date End Date Taking? Authorizing Provider  amLODipine (NORVASC) 10 MG tablet One tab po for BP 01/16/15  Yes [name], MD  calcium carbonate (CALCIUM 600) 600 MG TABS tablet Take 1 tablet (600 mg total) by mouth daily with breakfast. 05/31/14  Yes [name], MD  cholecalciferol (VITAMIN D) 1000 UNITS tablet Take 1 tablet (1,000 Units total) by mouth daily. 05/31/14  Yes [name], MD    hydrochlorothiazide (HYDRODIURIL) 12.5 MG tablet Take 1 tablet (12.5 mg total) by mouth daily. 01/16/15  Yes [name], MD  ibuprofen (ADVIL,MOTRIN) 800 MG tablet Take 1 tablet (800 mg total) by mouth every 8 (eight) hours as needed. 01/16/15  Yes [name], MD  isosorbide mononitrate (IMDUR) 30 MG 24 hr tablet Take 1 tablet (30 mg total) by mouth daily. 01/16/15  Yes [name], MD  levothyroxine (SYNTHROID, LEVOTHROID) 150 MCG tablet Take 1 tablet (150 mcg total) by mouth daily before breakfast. 01/20/15  Yes [name], MD  Multiple Vitamin (MULTIVITAMIN WITH MINERALS) TABS tablet Take 1 tablet by mouth daily. 05/31/14  Yes [name], MD  imiquimod (ALDARA) 5 % cream Apply topically 3 (three) times a week. Patient not taking: Reported on 01/27/2015 01/04/13   Lavonia Drafts, MD  predniSONE (DELTASONE) 20 MG tablet Take 1 tablet (20 mg total) by mouth daily with breakfast. 01/27/15   [name], MD  traMADol (ULTRAM) 50 MG tablet Take 1 tablet (50 mg total) by mouth every 6 (six) hours as needed. Patient not taking: Reported on 01/27/2015 01/16/15   [name], MD     Objective:   Filed Vitals:   01/27/15 1219  BP: 116/74  Pulse: 79  Temp: 98.2 F (36.8 C)  TempSrc: Oral  Resp: 18  Height: 5\' 7"  (1.702 m)  Weight: 181 lb 3.2 oz (82.192 kg)  SpO2: 99%    Exam General appearance : Awake, alert, not  in any distress. Speech Clear. Not toxic looking. Soft spoken but no hoarseness, no evidence of lump in throat, no tongue swelling or redness. HEENT: Atraumatic and Normocephalic, pupils equally reactive to light and accomodation Neck: supple, no JVD. No cervical lymphadenopathy.  Chest:Good air entry bilaterally, no added sounds  CVS: S1 S2 regular, no murmurs.  Abdomen: Bowel sounds present, Non tender and not distended with no gaurding, rigidity or rebound. Extremities: B/L Lower Ext shows no edema, both legs are warm  to touch Neurology: Awake alert, and oriented X 3, CN II-XII intact, Non focal Skin: No Rash  Data Review Lab Results  Component Value Date   HGBA1C 4.5 05/31/2014     Assessment & Plan   1. Allergic reaction caused by ? Hydrochlorothiazide  - methylPREDNISolone sodium succinate (SOLU-MEDROL) 125 mg/2 mL injection 60 mg; Inject 0.96 mLs (60 mg total) into the muscle once. - sodium chloride 0.9 % injection 1,000 mL; Inject 1,000 mLs into the vein once. - predniSONE (DELTASONE) 20 MG tablet; Take 1 tablet (20 mg total) by mouth daily with breakfast.  Dispense: 3 tablet; Refill: 0  2. Essential hypertension, benign  - We have discussed target BP range and blood pressure goal - I have advised patient to check BP regularly and to call us back or report to clinic if the numbers are consistently higher than 140/90  - We discussed the importance of compliance with medical therapy and DASH diet recommended, consequences of uncontrolled hypertension discussed.  - continue current BP medications  Patient have been counseled extensively about nutrition and exercise Return in about 3 days (around 01/30/2015), or if symptoms worsen or fail to improve, for Routine Follow Up, BP Check, Nurse Visit.  The patient was given clear instructions to go to ER or return to medical center if symptoms don't improve, worsen or new problems develop. The patient verbalized understanding. The patient was told to call to get lab results if they haven't heard anything in the next week.   This note has been created with Surveyor, quantity. Any transcriptional errors are unintentional.    Angelica Chessman, MD, [place], [place], [place], [place] and [place], [place]   01/27/2015, 1:10 PM

## 2015-01-30 ENCOUNTER — Encounter: Payer: Self-pay | Admitting: Internal Medicine

## 2015-01-30 ENCOUNTER — Ambulatory Visit: Payer: Self-pay | Attending: Internal Medicine | Admitting: Internal Medicine

## 2015-01-30 VITALS — BP 132/88 | HR 83 | Temp 98.0°F | Resp 16 | Wt 183.2 lb

## 2015-01-30 DIAGNOSIS — I10 Essential (primary) hypertension: Secondary | ICD-10-CM | POA: Insufficient documentation

## 2015-01-30 DIAGNOSIS — Z09 Encounter for follow-up examination after completed treatment for conditions other than malignant neoplasm: Secondary | ICD-10-CM | POA: Insufficient documentation

## 2015-01-30 DIAGNOSIS — T7840XA Allergy, unspecified, initial encounter: Secondary | ICD-10-CM

## 2015-01-30 DIAGNOSIS — Z889 Allergy status to unspecified drugs, medicaments and biological substances status: Secondary | ICD-10-CM

## 2015-01-30 DIAGNOSIS — T502X5A Adverse effect of carbonic-anhydrase inhibitors, benzothiadiazides and other diuretics, initial encounter: Secondary | ICD-10-CM | POA: Insufficient documentation

## 2015-01-30 MED ORDER — EPINEPHRINE 0.3 MG/0.3ML IJ SOAJ: 0.3000 mg | Freq: Once | INTRAMUSCULAR | Status: DC

## 2015-01-30 NOTE — Patient Instructions (Signed)
DASH Eating Plan DASH stands for "Dietary Approaches to Stop Hypertension." The DASH eating plan is a healthy eating plan that has been shown to reduce high blood pressure (hypertension). Additional health benefits may include reducing the risk of type 2 diabetes mellitus, heart disease, and stroke. The DASH eating plan may also help with weight loss. WHAT DO I NEED TO KNOW ABOUT THE DASH EATING PLAN? For the DASH eating plan, you will follow these general guidelines:  Choose foods with a percent daily value for sodium of less than 5% (as listed on the food label).  Use salt-free seasonings or herbs instead of table salt or sea salt.  Check with your health care provider or pharmacist before using salt substitutes.  Eat lower-sodium products, often labeled as "lower sodium" or "no salt added."  Eat fresh foods.  Eat more vegetables, fruits, and low-fat dairy products.  Choose whole grains. Look for the word "whole" as the first word in the ingredient list.  Choose fish and skinless chicken or turkey more often than red meat. Limit fish, poultry, and meat to 6 oz (170 g) each day.  Limit sweets, desserts, sugars, and sugary drinks.  Choose heart-healthy fats.  Limit cheese to 1 oz (28 g) per day.  Eat more home-cooked food and less restaurant, buffet, and fast food.  Limit fried foods.  Cook foods using methods other than frying.  Limit canned vegetables. If you do use them, rinse them well to decrease the sodium.  When eating at a restaurant, ask that your food be prepared with less salt, or no salt if possible. WHAT FOODS CAN I EAT? Seek help from a dietitian for individual calorie needs. Grains Whole grain or whole wheat bread. Brown rice. Whole grain or whole wheat pasta. Quinoa, bulgur, and whole grain cereals. Low-sodium cereals. Corn or whole wheat flour tortillas. Whole grain cornbread. Whole grain crackers. Low-sodium crackers. Vegetables Fresh or frozen vegetables  (raw, steamed, roasted, or grilled). Low-sodium or reduced-sodium tomato and vegetable juices. Low-sodium or reduced-sodium tomato sauce and paste. Low-sodium or reduced-sodium canned vegetables.  Fruits All fresh, canned (in natural juice), or frozen fruits. Meat and Other Protein Products Ground beef (85% or leaner), grass-fed beef, or beef trimmed of fat. Skinless chicken or turkey. Ground chicken or turkey. Pork trimmed of fat. All fish and seafood. Eggs. Dried beans, peas, or lentils. Unsalted nuts and seeds. Unsalted canned beans. Dairy Low-fat dairy products, such as skim or 1% milk, 2% or reduced-fat cheeses, low-fat ricotta or cottage cheese, or plain low-fat yogurt. Low-sodium or reduced-sodium cheeses. Fats and Oils Tub margarines without trans fats. Light or reduced-fat mayonnaise and salad dressings (reduced sodium). Avocado. Safflower, olive, or canola oils. Natural peanut or almond butter. Other Unsalted popcorn and pretzels. The items listed above may not be a complete list of recommended foods or beverages. Contact your dietitian for more options. WHAT FOODS ARE NOT RECOMMENDED? Grains White bread. White pasta. White rice. Refined cornbread. Bagels and croissants. Crackers that contain trans fat. Vegetables Creamed or fried vegetables. Vegetables in a cheese sauce. Regular canned vegetables. Regular canned tomato sauce and paste. Regular tomato and vegetable juices. Fruits Dried fruits. Canned fruit in light or heavy syrup. Fruit juice. Meat and Other Protein Products Fatty cuts of meat. Ribs, chicken wings, bacon, sausage, bologna, salami, chitterlings, fatback, hot dogs, bratwurst, and packaged luncheon meats. Salted nuts and seeds. Canned beans with salt. Dairy Whole or 2% milk, cream, half-and-half, and cream cheese. Whole-fat or sweetened yogurt. Full-fat   cheeses or blue cheese. Nondairy creamers and whipped toppings. Processed cheese, cheese spreads, or cheese  curds. Condiments Onion and garlic salt, seasoned salt, table salt, and sea salt. Canned and packaged gravies. Worcestershire sauce. Tartar sauce. Barbecue sauce. Teriyaki sauce. Soy sauce, including reduced sodium. Steak sauce. Fish sauce. Oyster sauce. Cocktail sauce. Horseradish. Ketchup and mustard. Meat flavorings and tenderizers. Bouillon cubes. Hot sauce. Tabasco sauce. Marinades. Taco seasonings. Relishes. Fats and Oils Butter, stick margarine, lard, shortening, ghee, and bacon fat. Coconut, palm kernel, or palm oils. Regular salad dressings. Other Pickles and olives. Salted popcorn and pretzels. The items listed above may not be a complete list of foods and beverages to avoid. Contact your dietitian for more information. WHERE CAN I FIND MORE INFORMATION? National Heart, Lung, and Blood Institute: travelstabloid.com Document Released: 07/11/2011 Document Revised: 12/06/2013 Document Reviewed: 05/26/2013 [facility] Patient Information 2015 [place], [place]. This information is not intended to replace advice given to you by your health care provider. Make sure you discuss any questions you have with your health care provider. Hypertension Hypertension, commonly called high blood pressure, is when the force of blood pumping through your arteries is too strong. Your arteries are the blood vessels that carry blood from your heart throughout your body. A blood pressure reading consists of a higher number over a lower number, such as 110/72. The higher number (systolic) is the pressure inside your arteries when your heart pumps. The lower number (diastolic) is the pressure inside your arteries when your heart relaxes. Ideally you want your blood pressure below 120/80. Hypertension forces your heart to work harder to pump blood. Your arteries may become narrow or stiff. Having hypertension puts you at risk for heart disease, stroke, and other problems.  RISK  FACTORS Some risk factors for high blood pressure are controllable. Others are not.  Risk factors you cannot control include:   Race. You may be at higher risk if you are African American.  Age. Risk increases with age.  Gender. Men are at higher risk than women before age 37 years. After age 55, women are at higher risk than men. Risk factors you can control include:  Not getting enough exercise or physical activity.  Being overweight.  Getting too much fat, sugar, calories, or salt in your diet.  Drinking too much alcohol. SIGNS AND SYMPTOMS Hypertension does not usually cause signs or symptoms. Extremely high blood pressure (hypertensive crisis) may cause headache, anxiety, shortness of breath, and nosebleed. DIAGNOSIS  To check if you have hypertension, your health care provider will measure your blood pressure while you are seated, with your arm held at the level of your heart. It should be measured at least twice using the same arm. Certain conditions can cause a difference in blood pressure between your right and left arms. A blood pressure reading that is higher than normal on one occasion does not mean that you need treatment. If one blood pressure reading is high, ask your health care provider about having it checked again. TREATMENT  Treating high blood pressure includes making lifestyle changes and possibly taking medicine. Living a healthy lifestyle can help lower high blood pressure. You may need to change some of your habits. Lifestyle changes may include:  Following the DASH diet. This diet is high in fruits, vegetables, and whole grains. It is low in salt, red meat, and added sugars.  Getting at least 2 hours of brisk physical activity every week.  Losing weight if necessary.  Not smoking.  Limiting  alcoholic beverages.  Learning ways to reduce stress. If lifestyle changes are not enough to get your blood pressure under control, your health care provider may  prescribe medicine. You may need to take more than one. Work closely with your health care provider to understand the risks and benefits. HOME CARE INSTRUCTIONS  Have your blood pressure rechecked as directed by your health care provider.   Take medicines only as directed by your health care provider. Follow the directions carefully. Blood pressure medicines must be taken as prescribed. The medicine does not work as well when you skip doses. Skipping doses also puts you at risk for problems.   Do not smoke.   Monitor your blood pressure at home as directed by your health care provider. SEEK MEDICAL CARE IF:   You think you are having a reaction to medicines taken.  You have recurrent headaches or feel dizzy.  You have swelling in your ankles.  You have trouble with your vision. SEEK IMMEDIATE MEDICAL CARE IF:  You develop a severe headache or confusion.  You have unusual weakness, numbness, or feel faint.  You have severe chest or abdominal pain.  You vomit repeatedly.  You have trouble breathing. MAKE SURE YOU:   Understand these instructions.  Will watch your condition.  Will get help right away if you are not doing well or get worse. Document Released: 07/22/2005 Document Revised: 12/06/2013 Document Reviewed: 05/14/2013 [facility] Patient Information 2015 [place], [place]. This information is not intended to replace advice given to you by your health care provider. Make sure you discuss any questions you have with your health care provider. Dyslipidemia Dyslipidemia is an imbalance of the lipids in your blood. Lipids are waxy, fat-like proteins that your body needs in small amounts. Dyslipidemia often involves the lipids cholesterol or triglycerides. Common forms of dyslipidemia are:  High levels of bad cholesterol (LDL cholesterol). LDL cholesterol is the type of cholesterol that causes heart disease.  Low levels of good cholesterol (HDL cholesterol). HDL cholesterol  is the type of cholesterol that helps protect against heart disease.  High levels of triglycerides. Triglycerides are a fatty substance in the blood linked to a buildup of plaque on your arteries. RISK FACTORS  Increased age.  Having a family history of high cholesterol.  Certain medicines, including birth control pills, diuretics, beta-blockers, and some medicines for depression.  Smoking.  Eating a high-fat diet.  Being overweight.  Medical conditions such as diabetes, polycystic ovary syndrome, pregnancy, kidney disease, and hypothyroidism.  Lack of regular exercise. SIGNS AND SYMPTOMS There are no signs or symptoms with dyslipidemia.  DIAGNOSIS  A simple blood test called a fasting blood test can be done to determine your level of:  Total cholesterol. This is the combined number of LDL cholesterol and HDL cholesterol. A healthy number is lower than 200.  LDL cholesterol. The goal number for LDL cholesterol is different for each person depending on risk factors. Ask your health care provider what your LDL cholesterol number should be.  HDL cholesterol. A healthy level of HDL cholesterol is 60 or higher. A number lower than 40 for men or 50 for women is a danger sign.  Triglycerides. A healthy triglyceride number is less than 150. TREATMENT  Dyslipidemia is a treatable condition. Your health care provider will advise you on what type of treatment is best based on your age, your test results, and current guidelines. Treatment may include:   Dietary changes. A dietitian can help you create a meal  plan. You may need to:  Eat more foods that contain omega-3s, such as salmon and other fish.  Replace saturated fats and trans fats in your diet with healthy fats such as nuts, seeds, avocados, olive oil, and canola oil.  Regular exercise. This can help lower your LDL cholesterol, raise your HDL cholesterol, and help with weight management. Check with your health care provider before  beginning an exercise program. Most people should participate in 30 minutes of brisk exercise 5 days a week.  Quitting smoking.  Medicines to lower LDL cholesterol and triglycerides. Your health care provider will monitor your lipid levels with regular blood tests. HOME CARE INSTRUCTIONS  Eat a healthy diet. Follow any diet instructions if they were given to you by your health care provider.  Maintain a healthy weight.  Exercise regularly based on the recommendations of your health care provider.  Do not use any tobacco products, including cigarettes, chewing tobacco, or electronic cigarettes.  Take medicines only as directed by your health care provider.  Keep all follow-up visits as directed by your health care provider. SEEK MEDICAL CARE IF: You are having possible side effects from your medicines. Document Released: 07/27/2013 Document Revised: 12/06/2013 Document Reviewed: 07/27/2013 [facility] Patient Information 2015 [place], [place]. This information is not intended to replace advice given to you by your health care provider. Make sure you discuss any questions you have with your health care provider.

## 2015-01-30 NOTE — Progress Notes (Signed)
Patient ID: Heidi Barrett, female   DOB: 10-26-1965, 49 y.o.   MRN: [id number]   Heidi Barrett, is a 49 y.o. female  [id number]  [id number]  DOB - 19-Jun-1966  Chief Complaint  Patient presents with  . Follow-up    allergic reaction        Subjective:   Heidi Barrett is a 49 y.o. female here today for a follow up visit. Patient was seen here last week for possible allergic reaction to hydrochlorothiazide tablets, she is here today for follow-up after treatment. She was given IM Solu-Medrol and by mouth prednisone tablets. Patient has done very well. Shortness of breath is resolved. No cough, no rash, no itching, no eye redness, no chest tightness. Patient has No headache, No chest pain, No abdominal pain - No Nausea, No new weakness tingling or numbness. Blood pressure is controlled without the hydrochlorothiazide at this time.  No problems updated.  ALLERGIES: Allergies  Allergen Reactions  . Hydrocodone Nausea And Vomiting    dizzines    PAST MEDICAL HISTORY: Past Medical History  Diagnosis Date  . Thyroid disease   . Hypertension   . Anemia     MEDICATIONS AT HOME: Prior to Admission medications   Medication Sig Start Date End Date Taking? Authorizing Provider  amLODipine (NORVASC) 10 MG tablet One tab po for BP 01/16/15   [name], MD  calcium carbonate (CALCIUM 600) 600 MG TABS tablet Take 1 tablet (600 mg total) by mouth daily with breakfast. 05/31/14   [name], MD  cholecalciferol (VITAMIN D) 1000 UNITS tablet Take 1 tablet (1,000 Units total) by mouth daily. 05/31/14   [name], MD  EPINEPHrine 0.3 mg/0.3 mL IJ SOAJ injection Inject 0.3 mLs (0.3 mg total) into the muscle once. 01/30/15   [name], MD  hydrochlorothiazide (HYDRODIURIL) 12.5 MG tablet Take 1 tablet (12.5 mg total) by mouth daily. 01/16/15   [name], MD  ibuprofen (ADVIL,MOTRIN) 800 MG tablet Take 1 tablet (800 mg total) by mouth  every 8 (eight) hours as needed. 01/16/15   [name], MD  imiquimod (ALDARA) 5 % cream Apply topically 3 (three) times a week. Patient not taking: Reported on 01/27/2015 01/04/13   Lavonia Drafts, MD  isosorbide mononitrate (IMDUR) 30 MG 24 hr tablet Take 1 tablet (30 mg total) by mouth daily. 01/16/15   [name], MD  levothyroxine (SYNTHROID, LEVOTHROID) 150 MCG tablet Take 1 tablet (150 mcg total) by mouth daily before breakfast. 01/20/15   [name], MD  Multiple Vitamin (MULTIVITAMIN WITH MINERALS) TABS tablet Take 1 tablet by mouth daily. 05/31/14   [name], MD  predniSONE (DELTASONE) 20 MG tablet Take 1 tablet (20 mg total) by mouth daily with breakfast. 01/27/15   [name], MD  traMADol (ULTRAM) 50 MG tablet Take 1 tablet (50 mg total) by mouth every 6 (six) hours as needed. Patient not taking: Reported on 01/27/2015 01/16/15   [name], MD     Objective:   Filed Vitals:   01/30/15 0919  BP: 132/88  Pulse: 83  Temp: 98 F (36.7 C)  Resp: 16  Weight: 183 lb 3.2 oz (83.099 kg)  SpO2: 100%    Exam General appearance : Awake, alert, not in any distress. Speech Clear. Not toxic looking HEENT: Atraumatic and Normocephalic, pupils equally reactive to light and accomodation Neck: supple, no JVD. No cervical lymphadenopathy.  Chest:Good air entry bilaterally, no added sounds  CVS:  S1 S2 regular, no murmurs.  Abdomen: Bowel sounds present, Non tender and not distended with no gaurding, rigidity or rebound. Extremities: B/L Lower Ext shows no edema, both legs are warm to touch Neurology: Awake alert, and oriented X 3, CN II-XII intact, Non focal Skin:No Rash  Data Review Lab Results  Component Value Date   HGBA1C 4.5 05/31/2014     Assessment & Plan   1. Essential hypertension, benign Discontinue hydrochlorothiazide, and enlist as part of patient's allergy  - We have discussed target BP range and blood  pressure goal - I have advised patient to check BP regularly and to call us back or report to clinic if the numbers are consistently higher than 140/90  - We discussed the importance of compliance with medical therapy and DASH diet recommended, consequences of uncontrolled hypertension discussed.  - continue other BP medications  2. Allergic reaction caused by a drug  - EPINEPHrine 0.3 mg/0.3 mL IJ SOAJ injection; Inject 0.3 mLs (0.3 mg total) into the muscle once.  Dispense: 1 Device; Refill: 1 Keep with you, use when necessary as discussed.  Patient have been counseled extensively about nutrition and exercise  Return in about 3 months (around 05/02/2015) for Follow up HTN, Annual Physical.  The patient was given clear instructions to go to ER or return to medical center if symptoms don't improve, worsen or new problems develop. The patient verbalized understanding. The patient was told to call to get lab results if they haven't heard anything in the next week.   This note has been created with Surveyor, quantity. Any transcriptional errors are unintentional.    Angelica Chessman, MD, [place], [place], [place], [place] and [facility] [place], [place]   01/30/2015, 10:04 AM

## 2015-01-30 NOTE — Progress Notes (Signed)
Patient here for follow up on an allergic reaction to her blood pressure medication Patient states she was having some SOB and tightness in her chest Patient was  Seen here three days ago and was instructed to follow up with her dr Patient has since stopped taking HCTZ

## 2015-01-31 ENCOUNTER — Ambulatory Visit (INDEPENDENT_AMBULATORY_CARE_PROVIDER_SITE_OTHER): Payer: Self-pay | Admitting: Sports Medicine

## 2015-01-31 ENCOUNTER — Encounter: Payer: Self-pay | Admitting: Sports Medicine

## 2015-01-31 VITALS — BP 125/84 | Ht 67.0 in | Wt 181.0 lb

## 2015-01-31 DIAGNOSIS — M25551 Pain in right hip: Secondary | ICD-10-CM

## 2015-01-31 DIAGNOSIS — M722 Plantar fascial fibromatosis: Secondary | ICD-10-CM

## 2015-01-31 DIAGNOSIS — G5701 Lesion of sciatic nerve, right lower limb: Secondary | ICD-10-CM

## 2015-01-31 NOTE — Assessment & Plan Note (Signed)
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
## 2015-01-31 NOTE — Assessment & Plan Note (Signed)
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 Follow-up one month

## 2015-02-20 ENCOUNTER — Ambulatory Visit: Payer: No Typology Code available for payment source | Attending: Sports Medicine

## 2015-02-20 DIAGNOSIS — M25551 Pain in right hip: Secondary | ICD-10-CM | POA: Insufficient documentation

## 2015-02-20 DIAGNOSIS — M62838 Other muscle spasm: Secondary | ICD-10-CM

## 2015-02-20 DIAGNOSIS — M25572 Pain in left ankle and joints of left foot: Secondary | ICD-10-CM | POA: Insufficient documentation

## 2015-02-20 DIAGNOSIS — R6889 Other general symptoms and signs: Secondary | ICD-10-CM | POA: Insufficient documentation

## 2015-02-20 NOTE — Therapy (Signed)
[place], [place], [address] Phone: [phone]   Fax:  [phone]  Physical Therapy Evaluation  Patient Details  Name: Heidi Barrett MRN: [id number] Date of Birth: April 17, 1966 Referring Provider:  Thurman Coyer, DO  Encounter Date: 02/20/2015      PT End of Session - 02/20/15 1155    Visit Number 1   Number of Visits 12   Date for PT Re-Evaluation 04/03/15   Authorization Type GCCN   PT Start Time 1148   PT Stop Time 1230   PT Time Calculation (min) 42 min      Past Medical History  Diagnosis Date  . Thyroid disease   . Hypertension   . Anemia     No past surgical history on file.  There were no vitals filed for this visit.  Visit Diagnosis:  Pain in joint, pelvic region and thigh, right - Plan: PT plan of care cert/re-cert  Activity intolerance - Plan: PT plan of care cert/re-cert  Muscle spasm of right lower extremity - Plan: PT plan of care cert/re-cert  Pain in joint, ankle and foot, left - Plan: PT plan of care cert/re-cert      Subjective Assessment - 02/20/15 1156    Subjective She reports in RT hip from shifting weight off LT heel pain   Pertinent History She stands on concrete for work.    Limitations Standing   How long can you sit comfortably? As needed   How long can you stand comfortably? 60-80  min   How long can you walk comfortably? As needed   Patient Stated Goals She want to releive the pain.    Currently in Pain? Yes   Pain Location Hip  LT heel pain started end of May 2016   Pain Orientation Right   Pain Type --  sub acute   Pain Onset More than a month ago   Pain Frequency Intermittent   Aggravating Factors  Standing   Pain Relieving Factors Getting off feet   Effect of Pain on Daily Activities Limits standing    Multiple Pain Sites Yes            [facility] PT Assessment - 02/20/15 1202    Assessment   Medical Diagnosis RT hip pain   Onset  Date/Surgical Date --  early June 2016   Next MD Visit PRN   Prior Therapy No   Precautions   Precautions None   Restrictions   Weight Bearing Restrictions No   Balance Screen   Has the patient fallen in the past 6 months No   Prior Function   Level of Independence Independent   Cognition   Overall Cognitive Status Within Functional Limits for tasks assessed   ROM / Strength   AROM / PROM / Strength AROM;Strength   AROM   AROM Assessment Site Lumbar;Hip;Ankle   Right/Left Hip Right;Left   Right Hip Flexion 120   Right Hip External Rotation  40   Right Hip Internal Rotation  10   Right Hip ADduction 20   Left Hip Flexion 120   Left Hip External Rotation  40   Left Hip Internal Rotation  30   Left Hip ADduction 20   Right/Left Ankle Right;Left   Right Ankle Dorsiflexion 98   Left Ankle Dorsiflexion 92   Lumbar Flexion she can touch her mid tibia   Lumbar Extension 35   Lumbar - Right Side Bend 28   Lumbar - Left  Side Bend 30   Flexibility   Soft Tissue Assessment /Muscle Length yes   Hamstrings 50 degrees RT and LT   Palpation   SI assessment  appears LT rotation of sacrum, antierior ilia on RT    Palpation comment tenderness RT SI and lower lumba spine and laong RT sacrum   Ambulation/Gait   Gait Comments Today her pattern is normal                   [facility] Adult PT Treatment/Exercise - 02/20/15 1222    Exercises   Exercises Lumbar   Lumbar Exercises: Stretches   Single Knee to Chest Stretch 2 reps;20 seconds   Single Knee to Chest Stretch Limitations with adduction x2 and to RT shoulder x2   Modalities   Modalities Cryotherapy   Cryotherapy   Number Minutes Cryotherapy 10 Minutes   Cryotherapy Location --  SI RT    Type of Cryotherapy Ice pack   Manual Therapy   Manual Therapy Muscle Energy Technique   Muscle Energy Technique For RT anterior ilia with knee/hip flexion with use of hamstrings for movement.  ASIS and ankle even after                 PT Education - 02/20/15 1226    Education provided Yes   Education Details POC, Knee to chest   Person(s) Educated Patient   Methods Explanation;Tactile cues;Verbal cues;Handout   Comprehension Returned demonstration;Verbalized understanding          PT Short Term Goals - 02/20/15 1229    PT SHORT TERM GOAL #1   Title Independent with inital HEP   Time 3   Period Weeks   Status New   PT SHORT TERM GOAL #2   Title She will report 30% decr pain in LT SI area   Time 3   Period Weeks   Status New   PT SHORT TERM GOAL #3   Title she will come in to PT with level pelvis   Time 3   Period Weeks   Status New   PT SHORT TERM GOAL #4   Title She will report standing for 30 min wihtout incr pain           PT Long Term Goals - 02/20/15 1231    PT LONG TERM GOAL #1   Title She is independent with all HEP issued as of last visit   Time 6   Period Weeks   Status New   PT LONG TERM GOAL #2   Title She will report no pain standing 60 min or more.    Time 6   Period Weeks   Status New   PT LONG TERM GOAL #3   Title She will return to work and be able to work for 60-90 min without incr pain.    Time 6   Period Weeks   Status New               Plan - 02/20/15 1226    Clinical Impression Statement Ms Carrie Mew is very tender RT SI and lower lumbar area. . She was not level in pelvis and addressed this with MET that appeared to level pelvis.She should improve with PT with decr pain and improved stand toleranc   Pt will benefit from skilled therapeutic intervention in order to improve on the following deficits Decreased activity tolerance;Pain;Impaired flexibility;Increased muscle spasms   Rehab Potential Good   PT Frequency 2x / week  PT Duration 6 weeks  if needed   PT Treatment/Interventions Cryotherapy;Electrical Stimulation;Iontophoresis 69m/ml Dexamethasone;Moist Heat;Ultrasound;Dry needling;Passive range of motion;Taping;Manual  techniques;Patient/family education;Therapeutic exercise   PT Next Visit Plan MET if needed ,  modalities, Stab exercies and stretching   PT Home Exercise Plan Ice and knee to chest   Consulted and Agree with Plan of Care Patient         Problem List Patient Active Problem List   Diagnosis Date Noted  . Piriformis syndrome of right side 01/31/2015  . Allergic reaction caused by a drug 01/27/2015  . Heel spur 01/16/2015  . Essential hypertension, benign 01/16/2015  . Benign essential HTN 05/31/2014  . Plantar fasciitis 05/31/2014  . Other specified hypothyroidism 05/31/2014  . Intramural leiomyoma of uterus 05/31/2014  . Menorrhagia 01/04/2013  . Fibroids 01/04/2013    CDarrel HooverPT 02/20/2015, 12:36 PM  [facility] [address]Phone: [phone]  Fax:  [phone]

## 2015-02-20 NOTE — Patient Instructions (Signed)
Issued from cabinet knee to chest stretching 3x/day , 2-3 reps, 15-30 second stretch

## 2015-02-28 ENCOUNTER — Ambulatory Visit: Payer: No Typology Code available for payment source | Admitting: Rehabilitative and Restorative Service Providers"

## 2015-03-07 ENCOUNTER — Ambulatory Visit: Payer: No Typology Code available for payment source | Attending: Sports Medicine

## 2015-03-07 DIAGNOSIS — M62838 Other muscle spasm: Secondary | ICD-10-CM | POA: Insufficient documentation

## 2015-03-07 DIAGNOSIS — R6889 Other general symptoms and signs: Secondary | ICD-10-CM | POA: Insufficient documentation

## 2015-03-07 DIAGNOSIS — M25551 Pain in right hip: Secondary | ICD-10-CM | POA: Insufficient documentation

## 2015-03-07 DIAGNOSIS — M25572 Pain in left ankle and joints of left foot: Secondary | ICD-10-CM | POA: Insufficient documentation

## 2015-03-07 NOTE — Therapy (Signed)
[place], [place], [address] Phone: [phone]   Fax:  [phone]  Physical Therapy Treatment  Patient Details  Name: Heidi Barrett MRN: [id number] Date of Birth: [birth year] Referring Provider:  [name], MD  Encounter Date: 03/07/2015      PT End of Session - 03/07/15 1408    Visit Number 2   Number of Visits 12   Date for PT Re-Evaluation 04/03/15   PT Start Time 0130   PT Stop Time 0223   PT Time Calculation (min) 53 min   Activity Tolerance Patient tolerated treatment well   Behavior During Therapy [facility] for tasks assessed/performed      Past Medical History  Diagnosis Date  . Thyroid disease   . Hypertension   . Anemia     No past surgical history on file.  There were no vitals filed for this visit.  Visit Diagnosis:  Pain in joint, pelvic region and thigh, right  Activity intolerance  Muscle spasm of right lower extremity      Subjective Assessment - 03/07/15 1333    Subjective RT SI pain. Also some LT foot pain.  Pain 40-50% better.    Currently in Pain? Yes   Pain Score 6    Pain Location --  RT SI   Pain Orientation Right;Left   Pain Descriptors / Indicators Dull   Pain Type --  subacute   Pain Onset More than a month ago   Pain Frequency Intermittent   Aggravating Factors  Standing   Pain Relieving Factors sitting   Multiple Pain Sites Yes            [facility] PT Assessment - 03/07/15 1337    Posture/Postural Control   Posture Comments LT trunk shift, Rt pelvis higher than Lt   AROM   Lumbar Flexion She can touch her ankles   Lumbar Extension 40   Lumbar - Right Side Bend 26   Lumbar - Left Side Bend 34   Flexibility   Hamstrings 60 degrees bilaterally                     [facility] Adult PT Treatment/Exercise - 03/07/15 1337    Modalities   Modalities Ultrasound;Moist Heat   Moist Heat Therapy   Number Minutes Moist Heat 15 Minutes    Moist Heat Location Lumbar Spine  hip/SI area   Ultrasound   Ultrasound Location RT SI area   Ultrasound Parameters 100%, 1MHZ, 1.7 Wcm2   Ultrasound Goals Pain   Manual Therapy   Manual Therapy Soft tissue mobilization   Soft tissue mobilization with use of rock tool black into lower back and hip    Muscle Energy Technique For RT anterior ilia with knee/hip flexion with use of hamstrings for movement.  ASIS and ankle even after                  PT Short Term Goals - 03/07/15 1410    PT SHORT TERM GOAL #1   Title Independent with inital HEP   Status On-going   PT SHORT TERM GOAL #2   Title She will report 30% decr pain in LT SI area   Status Achieved   PT SHORT TERM GOAL #3   Title she will come in to PT with level pelvis   Status On-going   PT SHORT TERM GOAL #4   Title She will report standing for 30 min wihtout incr pain  Status On-going           PT Long Term Goals - 03/07/15 1411    PT LONG TERM GOAL #1   Title She is independent with all HEP issued as of last visit   Status On-going   PT LONG TERM GOAL #2   Title She will report no pain standing 60 min or more.    Status On-going   PT LONG TERM GOAL #3   Title She will return to work and be able to work for 60-90 min without incr pain.    Baseline She has not RTW   Status Unable to assess               Plan - 03/07/15 1409    Clinical Impression Statement subjuectively pain decreased. Walking with ;less limp. Marland Kitchen Pelvis more level post MET. Less tender to touch today. May need heel lift on LT   PT Next Visit Plan Assess treatment ., continue manual and modalities   Consulted and Agree with Plan of Care Patient        Problem List Patient Active Problem List   Diagnosis Date Noted  . Piriformis syndrome of right side 01/31/2015  . Allergic reaction caused by a drug 01/27/2015  . Heel spur 01/16/2015  . Essential hypertension, benign 01/16/2015  . Benign essential HTN 05/31/2014  .  Plantar fasciitis 05/31/2014  . Other specified hypothyroidism 05/31/2014  . Intramural leiomyoma of uterus 05/31/2014  . Menorrhagia 01/04/2013  . Fibroids 01/04/2013    Darrel Hoover PT 03/07/2015, 2:12 PM  [facility] [address] [place], [place], [address] Phone: [phone]   Fax:  [phone]

## 2015-03-10 ENCOUNTER — Ambulatory Visit: Payer: No Typology Code available for payment source | Admitting: Rehabilitative and Restorative Service Providers"

## 2015-03-14 ENCOUNTER — Ambulatory Visit: Payer: No Typology Code available for payment source

## 2015-03-14 DIAGNOSIS — R6889 Other general symptoms and signs: Secondary | ICD-10-CM

## 2015-03-14 DIAGNOSIS — M62838 Other muscle spasm: Secondary | ICD-10-CM

## 2015-03-14 DIAGNOSIS — M25551 Pain in right hip: Secondary | ICD-10-CM

## 2015-03-14 NOTE — Therapy (Signed)
[place], [place], [address] Phone: [phone]   Fax:  [phone]  Physical Therapy Treatment  Patient Details  Name: Heidi Barrett MRN: [id number] Date of Birth: 1965-10-28 Referring Provider:  [name], MD  Encounter Date: 03/14/2015      PT End of Session - 03/14/15 1411    Visit Number 3   Number of Visits 12   Date for PT Re-Evaluation 04/03/15   PT Start Time 0130   PT Stop Time 0225   PT Time Calculation (min) 55 min   Activity Tolerance Patient tolerated treatment well   Behavior During Therapy [facility] for tasks assessed/performed      Past Medical History  Diagnosis Date  . Thyroid disease   . Hypertension   . Anemia     No past surgical history on file.  There were no vitals filed for this visit.  Visit Diagnosis:  Pain in joint, pelvic region and thigh, right  Activity intolerance  Muscle spasm of right lower extremity      Subjective Assessment - 03/14/15 1332    Subjective My pain is alot better. Last pain was yesterday/   Currently in Pain? No/denies   Pain Frequency Intermittent   Aggravating Factors  standing   Pain Relieving Factors sitting   Multiple Pain Sites No                         [facility] Adult PT Treatment/Exercise - 03/14/15 1408    Modalities   Modalities Iontophoresis   Moist Heat Therapy   Number Minutes Moist Heat 15 Minutes   Moist Heat Location Hip  RT   Ultrasound   Ultrasound Location RT piriformis gluteals   Ultrasound Parameters 100% !MHz, 1.7 Wcm2   Ultrasound Goals Pain   Iontophoresis   Type of Iontophoresis Dexamethasone   Location RT piriformis   Dose 1cc   Time 4 hours   Manual Therapy   Soft tissue mobilization with use of rock tool black into lower piriformix/ hip                 PT Education - 03/14/15 1411    Education provided Yes   Education Details ionto precautions and mechanism of  ionto delivery of medication   Person(s) Educated Patient   Methods Explanation;Handout   Comprehension Verbalized understanding          PT Short Term Goals - 03/07/15 1410    PT SHORT TERM GOAL #1   Title Independent with inital HEP   Status On-going   PT SHORT TERM GOAL #2   Title She will report 30% decr pain in LT SI area   Status Achieved   PT SHORT TERM GOAL #3   Title she will come in to PT with level pelvis   Status On-going   PT SHORT TERM GOAL #4   Title She will report standing for 30 min wihtout incr pain   Status On-going           PT Long Term Goals - 03/07/15 1411    PT LONG TERM GOAL #1   Title She is independent with all HEP issued as of last visit   Status On-going   PT LONG TERM GOAL #2   Title She will report no pain standing 60 min or more.    Status On-going   PT LONG TERM GOAL #3   Title She will return to  work and be able to work for 60-90 min without incr pain.    Baseline She has not RTW   Status Unable to assess               Plan - 03/14/15 1412    Clinical Impression Statement Much improved with periods of  days without pain . She is still tneder In Rt piriformis area and we addressed this today.     PT Next Visit Plan Continue manual and modalities , add some strength exercies ( clam, abduciton and IR   Consulted and Agree with Plan of Care Patient        Problem List Patient Active Problem List   Diagnosis Date Noted  . Piriformis syndrome of right side 01/31/2015  . Allergic reaction caused by a drug 01/27/2015  . Heel spur 01/16/2015  . Essential hypertension, benign 01/16/2015  . Benign essential HTN 05/31/2014  . Plantar fasciitis 05/31/2014  . Other specified hypothyroidism 05/31/2014  . Intramural leiomyoma of uterus 05/31/2014  . Menorrhagia 01/04/2013  . Fibroids 01/04/2013    Darrel Hoover PT 03/14/2015, 2:15 PM  [place] [facility] [address] [place], [place], [address] Phone: [phone]   Fax:  [phone]

## 2015-03-14 NOTE — Patient Instructions (Signed)

## 2015-03-16 ENCOUNTER — Ambulatory Visit: Payer: No Typology Code available for payment source | Admitting: Physical Therapy

## 2015-03-16 DIAGNOSIS — M25551 Pain in right hip: Secondary | ICD-10-CM

## 2015-03-16 DIAGNOSIS — M62838 Other muscle spasm: Secondary | ICD-10-CM

## 2015-03-16 DIAGNOSIS — M25572 Pain in left ankle and joints of left foot: Secondary | ICD-10-CM

## 2015-03-16 DIAGNOSIS — R6889 Other general symptoms and signs: Secondary | ICD-10-CM

## 2015-03-16 NOTE — Therapy (Signed)
[place], [place], [address] Phone: [phone]   Fax:  [phone]  Physical Therapy Treatment  Patient Details  Name: Heidi Barrett MRN: [id number] Date of Birth: 05-02-1966 Referring Provider:  [name], MD  Encounter Date: 03/16/2015      PT End of Session - 03/16/15 1359    Visit Number 4   Number of Visits 12   Date for PT Re-Evaluation 04/03/15   Authorization Type GCCN   PT Start Time 0146   PT Stop Time 0215   PT Time Calculation (min) 29 min      Past Medical History  Diagnosis Date  . Thyroid disease   . Hypertension   . Anemia     No past surgical history on file.  There were no vitals filed for this visit.  Visit Diagnosis:  Pain in joint, pelvic region and thigh, right  Activity intolerance  Muscle spasm of right lower extremity  Pain in joint, ankle and foot, left      Subjective Assessment - 03/16/15 1347    Subjective Last treatment helped alot.  I am having just a little bit of pain.    Currently in Pain? Yes   Pain Score 3    Pain Location --  Rt SI                         [facility] Adult PT Treatment/Exercise - 03/16/15 1402    Self-Care   Self-Care Other Self-Care Comments   Other Self-Care Comments  SI Belt trial on pt with ot reporting decreased pain with standing and walking. Pt given info on SI belt and referral to medical supply store for purchase   Lumbar Exercises: Supine   Ab Set 10 reps   Other Supine Lumbar Exercises Pelvic floor with ball squeeze, with abduction press x 10 each   Modalities   Modalities Iontophoresis   Iontophoresis   Type of Iontophoresis Dexamethasone   Location RT piriformis   Dose 1cc   Time 4 hours                PT Education - 03/16/15 1638    Education provided Yes   Education Details Tra contract with ball squeeze and isometric clam, SI belt information   Person(s) Educated  Patient   Methods Explanation;Handout   Comprehension Verbalized understanding          PT Short Term Goals - 03/07/15 1410    PT SHORT TERM GOAL #1   Title Independent with inital HEP   Status On-going   PT SHORT TERM GOAL #2   Title She will report 30% decr pain in LT SI area   Status Achieved   PT SHORT TERM GOAL #3   Title she will come in to PT with level pelvis   Status On-going   PT SHORT TERM GOAL #4   Title She will report standing for 30 min wihtout incr pain   Status On-going           PT Long Term Goals - 03/07/15 1411    PT LONG TERM GOAL #1   Title She is independent with all HEP issued as of last visit   Status On-going   PT LONG TERM GOAL #2   Title She will report no pain standing 60 min or more.    Status On-going   PT LONG TERM GOAL #3   Title She will  return to work and be able to work for 60-90 min without incr pain.    Baseline She has not RTW   Status Unable to assess               Plan - 03/16/15 1406    Clinical Impression Statement Pt reports relief with trial of SI belt in clinic today. pt would like to purchase one for work duties. Pt given information on where to purchase. Instructed pt in SI stabilization exercises for HEP.    PT Next Visit Plan Continue manual and modalities , add some strength exercies ( clam, abduciton and IR, review SI exercises        Problem List Patient Active Problem List   Diagnosis Date Noted  . Piriformis syndrome of right side 01/31/2015  . Allergic reaction caused by a drug 01/27/2015  . Heel spur 01/16/2015  . Essential hypertension, benign 01/16/2015  . Benign essential HTN 05/31/2014  . Plantar fasciitis 05/31/2014  . Other specified hypothyroidism 05/31/2014  . Intramural leiomyoma of uterus 05/31/2014  . Menorrhagia 01/04/2013  . Fibroids 01/04/2013    Dorene Ar, PTA 03/16/2015, 4:38 PM  [facility] [address] [place], [place], [address] Phone: [phone]   Fax:  [phone]

## 2015-03-21 ENCOUNTER — Ambulatory Visit: Payer: No Typology Code available for payment source

## 2015-03-21 DIAGNOSIS — M25551 Pain in right hip: Secondary | ICD-10-CM

## 2015-03-21 DIAGNOSIS — M62838 Other muscle spasm: Secondary | ICD-10-CM

## 2015-03-21 DIAGNOSIS — R6889 Other general symptoms and signs: Secondary | ICD-10-CM

## 2015-03-21 DIAGNOSIS — M25572 Pain in left ankle and joints of left foot: Secondary | ICD-10-CM

## 2015-03-21 NOTE — Patient Instructions (Signed)
Remove in 4 hours ionto patch

## 2015-03-21 NOTE — Therapy (Signed)
[place], [place], [address] Phone: [phone]   Fax:  [phone]  Physical Therapy Treatment  Patient Details  Name: Heidi Barrett MRN: [id number] Date of Birth: 1966/04/09 Referring Provider:  [name], MD  Encounter Date: 03/21/2015      PT End of Session - 03/21/15 1403    Visit Number 5   Number of Visits 12   Date for PT Re-Evaluation 04/03/15   PT Start Time 0135   PT Stop Time 0225   PT Time Calculation (min) 50 min   Activity Tolerance Patient tolerated treatment well   Behavior During Therapy [facility] for tasks assessed/performed      Past Medical History  Diagnosis Date  . Thyroid disease   . Hypertension   . Anemia     No past surgical history on file.  There were no vitals filed for this visit.  Visit Diagnosis:  Pain in joint, pelvic region and thigh, right  Activity intolerance  Muscle spasm of right lower extremity  Pain in joint, ankle and foot, left      Subjective Assessment - 03/21/15 1343    Subjective Doing better . Have not purchased SI belt. My job will change back to original position to decreased repetativeness of turning    Currently in Pain? Yes   Pain Score 2    Pain Orientation Left;Right   Pain Descriptors / Indicators Dull   Pain Type --  sub acute   Pain Onset More than a month ago   Pain Frequency Intermittent   Aggravating Factors  standing   Multiple Pain Sites No                         [facility] Adult PT Treatment/Exercise - 03/21/15 1346    Lumbar Exercises: Supine   Ab Set 10 reps   AB Set Limitations tactile and verbal cues for Tranverse abdominus activation   Other Supine Lumbar Exercises Posterior pelvic tilt x 12 5 sec hold,  Post pelvic tilt with ball squeeze 5 sec x 10  and 10 reps with Transverese abdominus set.   Lumbar Exercises: Sidelying   Clam 15 reps  RT  3 sec hold   Moist Heat Therapy   Number  Minutes Moist Heat 15 Minutes   Moist Heat Location Hip   Iontophoresis   Type of Iontophoresis Dexamethasone   Location RT piriformis   Dose 1cc   Time 4 hours                  PT Short Term Goals - 03/21/15 1405    PT SHORT TERM GOAL #1   Title Independent with inital HEP   Status Achieved   PT SHORT TERM GOAL #2   Title She will report 30% decr pain in LT SI area   Status Achieved   PT SHORT TERM GOAL #3   Title she will come in to PT with level pelvis   Status Achieved   PT SHORT TERM GOAL #4   Title She will report standing for 30 min wihtout incr pain   Status On-going           PT Long Term Goals - 03/07/15 1411    PT LONG TERM GOAL #1   Title She is independent with all HEP issued as of last visit   Status On-going   PT LONG TERM GOAL #2   Title She will report  no pain standing 60 min or more.    Status On-going   PT LONG TERM GOAL #3   Title She will return to work and be able to work for 60-90 min without incr pain.    Baseline She has not RTW   Status Unable to assess               Plan - 03/21/15 1404    Clinical Impression Statement Still tender RT SI and pitriformis but mild and mostly standing though felt this with clams. Will contniue per plan.    PT Next Visit Plan Continue manual and modalities , add some strength exercies ( clam, abduciton and IR, review SI exercises   Consulted and Agree with Plan of Care Patient        Problem List Patient Active Problem List   Diagnosis Date Noted  . Piriformis syndrome of right side 01/31/2015  . Allergic reaction caused by a drug 01/27/2015  . Heel spur 01/16/2015  . Essential hypertension, benign 01/16/2015  . Benign essential HTN 05/31/2014  . Plantar fasciitis 05/31/2014  . Other specified hypothyroidism 05/31/2014  . Intramural leiomyoma of uterus 05/31/2014  . Menorrhagia 01/04/2013  . Fibroids 01/04/2013    Darrel Hoover  PT  03/21/2015, 2:06 PM  [place] [facility] [address] [place], [place], [address] Phone: [phone]   Fax:  [phone]

## 2015-03-22 ENCOUNTER — Ambulatory Visit (INDEPENDENT_AMBULATORY_CARE_PROVIDER_SITE_OTHER): Payer: No Typology Code available for payment source | Admitting: Sports Medicine

## 2015-03-22 ENCOUNTER — Encounter: Payer: Self-pay | Admitting: Sports Medicine

## 2015-03-22 VITALS — BP 134/85 | Ht 67.0 in | Wt 179.0 lb

## 2015-03-22 DIAGNOSIS — M722 Plantar fascial fibromatosis: Secondary | ICD-10-CM

## 2015-03-22 NOTE — Progress Notes (Signed)
   Subjective:    Patient ID: Heidi Barrett, female    DOB: Sep 06, 1965, 49 y.o.   MRN: [id number]  HPI Patient returns to the office for follow-up on plantar fasciitis of her left foot and right-sided piriformis syndrome. She has been going to physical therapy for her right hip pain and as a result that has improved dramatically. Her left foot pain has improved as well. In fact the pain she was experiencing in her heel has resolved. She is now getting some pain along the lateral aspect of her foot. No swelling. She brought in a pair of tennis shoes today for [place] to fit them with inserts. She has been doing her plantar fascial stretches and daily icing.   Review of Systems     Objective:   Physical Exam Well-developed, well-nourished. No acute distress. Awake alert and oriented 3. Vital signs reviewed.   Left foot: There is no tenderness to palpation at the origin of the plantar fascia. Slight tenderness along the lateral aspect of the foot but a negative metatarsal squeeze. No soft tissue swelling. Neurovascularly intact distally.       Assessment & Plan:  Improving plantar fasciitis Improving piriformis syndrome  Patient will wean to a home exercise program per the therapist's discretion. For her athletic shoes we've given her a pair of green sports insoles with scaphoid pads. Unfortunately she did not bring in her work shoes but if she finds the green inserts to be inadequate then we could simply place scaphoid pads into those shoes. Follow-up as needed.

## 2015-03-23 ENCOUNTER — Ambulatory Visit: Payer: No Typology Code available for payment source

## 2015-03-23 DIAGNOSIS — M25551 Pain in right hip: Secondary | ICD-10-CM

## 2015-03-23 DIAGNOSIS — R6889 Other general symptoms and signs: Secondary | ICD-10-CM

## 2015-03-23 DIAGNOSIS — M62838 Other muscle spasm: Secondary | ICD-10-CM

## 2015-03-23 NOTE — Therapy (Signed)
[place], [place], [address] Phone: [phone]   Fax:  [phone]  Physical Therapy Treatment  Patient Details  Name: Heidi Barrett MRN: [id number] Date of Birth: August 03, 1966 Referring Provider:  [name], MD  Encounter Date: 03/23/2015      PT End of Session - 03/23/15 1232    Visit Number 6   Number of Visits 6   Date for PT Re-Evaluation 04/03/15   PT Start Time 9381   PT Stop Time 1240   PT Time Calculation (min) 55 min   Activity Tolerance Patient tolerated treatment well   Behavior During Therapy [facility] for tasks assessed/performed      Past Medical History  Diagnosis Date  . Thyroid disease   . Hypertension   . Anemia     No past surgical history on file.  There were no vitals filed for this visit.  Visit Diagnosis:  Pain in joint, pelvic region and thigh, right  Activity intolerance  Muscle spasm of right lower extremity      Subjective Assessment - 03/23/15 1157    Subjective Pain with standing but sitting with no pain today.  Chair tupe will cause pain. If she can sit straight then pain is gone. .   Currently in Pain? No/denies                         [facility] Adult PT Treatment/Exercise - 03/23/15 1203    Lumbar Exercises: Quadruped   Madcat/Old Horse 10 reps   Straight Leg Raise 10 reps   followeed by cat/camel and childs pose 30 sec with gentle o   Modalities   Modalities Iontophoresis   Moist Heat Therapy   Number Minutes Moist Heat 15 Minutes   Moist Heat Location Hip   Iontophoresis   Type of Iontophoresis Dexamethasone   Location RT piriformis   Dose 1cc   Time 4 hours   Manual Therapy   Soft tissue mobilization with use of rock tool siver and manually  into lower back /piriformix/ hip     She needed verbal and tactile cues for quadraped exercises              PT Short Term Goals - 03/23/15 1234    PT SHORT TERM GOAL  #1   Title Independent with inital HEP   Status Achieved   PT SHORT TERM GOAL #2   Title She will report 30% decr pain in LT SI area   Status Achieved   PT SHORT TERM GOAL #4   Title She will report standing for 30 min wihtout incr pain   Status Achieved           PT Long Term Goals - 03/23/15 1234    PT LONG TERM GOAL #1   Title She is independent with all HEP issued as of last visit   Status On-going   PT LONG TERM GOAL #2   Title She will report no pain standing 60 min or more.    Baseline 45 min   Status On-going   PT LONG TERM GOAL #3   Title She will return to work and be able to work for 60-90 min without incr pain.    Baseline RTW next visit   Status Unable to assess               Plan - 03/23/15 1232    Clinical Impression Statement Still tender but  improveing with decreased pain. She did exercies without incr pain so next visit start quadraps stretch and strength exer for HEP. Continue modalities , manual   PT Next Visit Plan Continue manual and modalities , add some strength exercies ( clam, abduciton and IR, review SI exercises   Consulted and Agree with Plan of Care Patient        Problem List Patient Active Problem List   Diagnosis Date Noted  . Piriformis syndrome of right side 01/31/2015  . Allergic reaction caused by a drug 01/27/2015  . Heel spur 01/16/2015  . Essential hypertension, benign 01/16/2015  . Benign essential HTN 05/31/2014  . Plantar fasciitis 05/31/2014  . Other specified hypothyroidism 05/31/2014  . Intramural leiomyoma of uterus 05/31/2014  . Menorrhagia 01/04/2013  . Fibroids 01/04/2013    Darrel Hoover PT 03/23/2015, 12:35 PM  [place] [facility] [address] [place], [place], [address] Phone: [phone]   Fax:  [phone]

## 2015-03-28 ENCOUNTER — Ambulatory Visit: Payer: No Typology Code available for payment source | Admitting: Physical Therapy

## 2015-03-28 DIAGNOSIS — M25572 Pain in left ankle and joints of left foot: Secondary | ICD-10-CM

## 2015-03-28 DIAGNOSIS — R6889 Other general symptoms and signs: Secondary | ICD-10-CM

## 2015-03-28 DIAGNOSIS — M62838 Other muscle spasm: Secondary | ICD-10-CM

## 2015-03-28 DIAGNOSIS — M25551 Pain in right hip: Secondary | ICD-10-CM

## 2015-03-28 NOTE — Therapy (Signed)
[place], [place], [address] Phone: [phone]   Fax:  [phone]  Physical Therapy Treatment  Patient Details  Name: Heidi Barrett MRN: [id number] Date of Birth: 14-Aug-1965 Referring Provider:  [name], MD  Encounter Date: 03/28/2015      PT End of Session - 03/28/15 1524    Visit Number 7   Number of Visits 12   Date for PT Re-Evaluation 04/03/15   Authorization Type GCCN   PT Start Time 0304   PT Stop Time 0346   PT Time Calculation (min) 42 min      Past Medical History  Diagnosis Date  . Thyroid disease   . Hypertension   . Anemia     No past surgical history on file.  There were no vitals filed for this visit.  Visit Diagnosis:  Pain in joint, pelvic region and thigh, right  Activity intolerance  Muscle spasm of right lower extremity  Pain in joint, ankle and foot, left      Subjective Assessment - 03/28/15 1506    Subjective I do not have pain today  but I had it yesterday. I stood for 4 hours today at work without pain. Left ankle was hurting. Dr will adjust othotics in new work shoes. I have not been able to afford an SI belt yet.    Currently in Pain? No/denies                         [facility] Adult PT Treatment/Exercise - 03/28/15 1512    Lumbar Exercises: Stretches   Quadruped Mid Back Stretch 3 reps;30 seconds   Quadruped Mid Back Stretch Limitations childs pose   Lumbar Exercises: Supine   Bridge 20 reps   Bridge Limitations shoulder bridge   Other Supine Lumbar Exercises Pelvic floor with ball squeeze, with abduction press x 10 each   Lumbar Exercises: Sidelying   Clam 15 reps  bilateral   Clam Limitations red band cues for controled motion.    Lumbar Exercises: Quadruped   Madcat/Old Horse 10 reps   Madcat/Old Horse Limitations 10 sec hold   Straight Leg Raise 10 reps   Opposite Arm/Leg Raise 10 reps   Opposite Arm/Leg Raise  Limitations 5 sec hold with cues for neutral    Modalities   Modalities Iontophoresis   Iontophoresis   Type of Iontophoresis Dexamethasone   Location RT piriformis   Dose 1cc   Time 4 hours                PT Education - 03/28/15 1527    Education provided Yes   Education Details Quadruped : ca/tcamel, childs pose, Alt UE/LE, shoulder bridge   Person(s) Educated Patient   Methods Explanation;Handout   Comprehension Verbalized understanding          PT Short Term Goals - 03/23/15 1234    PT SHORT TERM GOAL #1   Title Independent with inital HEP   Status Achieved   PT SHORT TERM GOAL #2   Title She will report 30% decr pain in LT SI area   Status Achieved   PT SHORT TERM GOAL #4   Title She will report standing for 30 min wihtout incr pain   Status Achieved           PT Long Term Goals - 03/23/15 1234    PT LONG TERM GOAL #1   Title She is independent with all  HEP issued as of last visit   Status On-going   PT LONG TERM GOAL #2   Title She will report no pain standing 60 min or more.    Baseline 45 min   Status On-going   PT LONG TERM GOAL #3   Title She will return to work and be able to work for 60-90 min without incr pain.    Baseline RTW next visit   Status Unable to assess               Plan - 03/28/15 1655    Clinical Impression Statement Instructed pt in quadruped cat/camel, childs pose as well and quadruped stabilization for HEP without increased pain. Able to stand 4 hours at work today without increased pain. If this continues, LTGs will be met. She took ibuprofin  twice today which may be masking some of her pain.     PT Next Visit Plan Continue manual and modalities , review HEP, assess goals        Problem List Patient Active Problem List   Diagnosis Date Noted  . Piriformis syndrome of right side 01/31/2015  . Allergic reaction caused by a drug 01/27/2015  . Heel spur 01/16/2015  . Essential hypertension, benign 01/16/2015   . Benign essential HTN 05/31/2014  . Plantar fasciitis 05/31/2014  . Other specified hypothyroidism 05/31/2014  . Intramural leiomyoma of uterus 05/31/2014  . Menorrhagia 01/04/2013  . Fibroids 01/04/2013    Dorene Ar, PTA 03/28/2015, 4:57 PM  [place], [place], [address] Phone: [phone]   Fax:  [phone]

## 2015-03-28 NOTE — Patient Instructions (Addendum)
Angry Cat, All Fours   Kneel on hands and knees. Tuck chin and tighten stomach. Exhale and round back upward. Inhale and arch back downward. Hold each position _10__ seconds. Repeat __10_ times per session. Do 2___ sessions per day.  Flexion   Sitting on knees, fold body over legs and relax head and arms on floor. Extend arms as far forward as possible. Hold __30__ seconds. Repeat __3__ times. Do _2___ sessions per day.  Copyright  VHI. All rights reserved.    Isometric Hold (Quadruped)   On hands and knees, slowly inhale, and then exhale. Pull navel toward spine and Hold for _5__ seconds.   Bracing With Leg Raise (Quadruped)   On hands and knees find neutral spine. Tighten pelvic floor and abdominals and hold. Alternating legs, straighten and lift to hip level. Repeat _10__ times. Do _2__ times a day.   Copyright  VHI. All rights reserved.  Bracing With Arm / Leg Raise (Quadruped)   On hands and knees find neutral spine. Tighten pelvic floor and abdominals and hold. Alternating, lift arm to shoulder level and opposite leg to hip level. Repeat _10__ times. Do _2__ times a day.   Copyright  VHI. All rights reserved.  External Rotation: Hip - Knees Apart With Pelvic Floor (Side-Lying)   Lie on left side with hips and knees slightly bent, band tied just above knees. Squeeze pelvic floor while lifting top knee. Hold for _5__ seconds.  Repeat _15__ times. Do __2_ times a day.  Bridge   Lie back, legs bent. Tilt pelvis then  Press  hips up. Keeping ribs in, lengthen lower back. Exhale, rolling down along spine from top.  Repeat _10___ times. Do __2__ sessions per day.  Copyright  VHI. All rights reserved.

## 2015-03-30 ENCOUNTER — Ambulatory Visit: Payer: No Typology Code available for payment source | Admitting: Physical Therapy

## 2015-03-30 DIAGNOSIS — R6889 Other general symptoms and signs: Secondary | ICD-10-CM

## 2015-03-30 DIAGNOSIS — M25551 Pain in right hip: Secondary | ICD-10-CM

## 2015-03-30 DIAGNOSIS — M62838 Other muscle spasm: Secondary | ICD-10-CM

## 2015-03-30 NOTE — Therapy (Signed)
[place], [place], [address] Phone: [phone]   Fax:  [phone]  Physical Therapy Treatment  Patient Details  Name: Heidi Barrett MRN: [id number] Date of Birth: 23-Aug-1965 Referring Provider:  [name], MD  Encounter Date: 03/30/2015      PT End of Session - 03/30/15 1546    Visit Number 8   Number of Visits 12   Date for PT Re-Evaluation 04/03/15   PT Start Time 1502   PT Stop Time 1602   PT Time Calculation (min) 60 min   Activity Tolerance Patient tolerated treatment well;No increased pain   Behavior During Therapy [facility] for tasks assessed/performed      Past Medical History  Diagnosis Date  . Thyroid disease   . Hypertension   . Anemia     No past surgical history on file.  There were no vitals filed for this visit.  Visit Diagnosis:  Activity intolerance  Pain in joint, pelvic region and thigh, right  Muscle spasm of right lower extremity      Subjective Assessment - 03/30/15 1519    Subjective 7/10                         [facility] Adult PT Treatment/Exercise - 03/30/15 1500    Self-Care   Other Self-Care Comments  SI do and don't   Lumbar Exercises: Quadruped   Madcat/Old Horse 10 reps   Madcat/Old Horse Limitations 10 sec hold   Straight Leg Raise 10 reps   Opposite Arm/Leg Raise 10 reps   Opposite Arm/Leg Raise Limitations 5 sec hold with cues for neutral    Modalities   Modalities Iontophoresis   Moist Heat Therapy   Moist Heat Location Hip     Also supine neutral spine with bent knee raise cues to not move pelvis.           PT Education - 03/30/15 1546    Education provided Yes   Education Details si do and don'ts   Person(s) Educated Patient   Methods Explanation;Handout   Comprehension Verbalized understanding          PT Short Term Goals - 03/23/15 1234    PT SHORT TERM GOAL #1   Title Independent with inital HEP    Status Achieved   PT SHORT TERM GOAL #2   Title She will report 30% decr pain in LT SI area   Status Achieved   PT SHORT TERM GOAL #4   Title She will report standing for 30 min wihtout incr pain   Status Achieved           PT Long Term Goals - 03/30/15 1549    PT LONG TERM GOAL #1   Title She is independent with all HEP issued as of last visit   Time 6   Period Weeks   Status On-going   PT LONG TERM GOAL #2   Title She will report no pain standing 60 min or more.    Time 6   Status On-going   PT LONG TERM GOAL #3   Title She will return to work and be able to work for 60-90 min without incr pain.    Time 6   Period Weeks   Status Unable to assess               Plan - 03/30/15 1547    Clinical Impression Statement Rt  SI higher.  Able to level with muscle energy.  Progress toward home exercise goals.     PT Next Visit Plan rewirw muscle energy.  Core stabilization.See how work goes.   PT Home Exercise Plan muscle energy        Problem List Patient Active Problem List   Diagnosis Date Noted  . Piriformis syndrome of right side 01/31/2015  . Allergic reaction caused by a drug 01/27/2015  . Heel spur 01/16/2015  . Essential hypertension, benign 01/16/2015  . Benign essential HTN 05/31/2014  . Plantar fasciitis 05/31/2014  . Other specified hypothyroidism 05/31/2014  . Intramural leiomyoma of uterus 05/31/2014  . Menorrhagia 01/04/2013  . Fibroids 01/04/2013    HARRIS,KAREN 03/30/2015, 3:50 PM  [facility] [address] [place], [place], [address] Phone: [phone]   Fax:  [phone]     Melvenia Needles, PTA 03/30/2015 3:50 PM Phone: [phone] Fax: [phone]

## 2015-04-04 ENCOUNTER — Ambulatory Visit: Payer: No Typology Code available for payment source

## 2015-04-05 ENCOUNTER — Telehealth: Payer: Self-pay | Admitting: Physical Therapy

## 2015-04-05 NOTE — Telephone Encounter (Signed)
Left message regarding missed appointment yesterday and the need to schedule with PT for renewal at next visit. Her next visit is tomorrow however it needs to be rescheduled with primary PT for renewal. Asked pt to return my call to schedule.

## 2015-04-06 ENCOUNTER — Encounter: Payer: Self-pay | Admitting: Physical Therapy

## 2015-04-17 ENCOUNTER — Encounter: Payer: No Typology Code available for payment source | Admitting: Sports Medicine

## 2015-04-24 ENCOUNTER — Encounter: Payer: No Typology Code available for payment source | Admitting: Sports Medicine

## 2015-04-25 ENCOUNTER — Ambulatory Visit: Payer: No Typology Code available for payment source | Attending: Sports Medicine

## 2015-04-25 DIAGNOSIS — M62838 Other muscle spasm: Secondary | ICD-10-CM

## 2015-04-25 DIAGNOSIS — M25551 Pain in right hip: Secondary | ICD-10-CM | POA: Insufficient documentation

## 2015-04-25 DIAGNOSIS — R6889 Other general symptoms and signs: Secondary | ICD-10-CM | POA: Insufficient documentation

## 2015-04-25 NOTE — Therapy (Signed)
[place], [place], [address] Phone: [phone]   Fax:  [phone]  Physical Therapy Treatment  Patient Details  Name: Heidi Barrett MRN: [id number] Date of Birth: 1965/12/10 Referring Provider:  [name], MD  Encounter Date: 04/25/2015      PT End of Session - 04/25/15 1616    Visit Number 9   Number of Visits 12   Date for PT Re-Evaluation 04/03/15   PT Start Time 0350   PT Stop Time 0413   PT Time Calculation (min) 23 min   Behavior During Therapy [facility] for tasks assessed/performed      Past Medical History  Diagnosis Date  . Thyroid disease   . Hypertension   . Anemia     No past surgical history on file.  There were no vitals filed for this visit.  Visit Diagnosis:  Activity intolerance  Pain in joint, pelvic region and thigh, right  Muscle spasm of right lower extremity      Subjective Assessment - 04/25/15 1550    Subjective I feel a whole lot better. No pain now. Once a few days ago but went away.   Im doing the HEP.     Currently in Pain? No/denies                                   PT Short Term Goals - 03/23/15 1234    PT SHORT TERM GOAL #1   Title Independent with inital HEP   Status Achieved   PT SHORT TERM GOAL #2   Title She will report 30% decr pain in LT SI area   Status Achieved   PT SHORT TERM GOAL #4   Title She will report standing for 30 min wihtout incr pain   Status Achieved           PT Long Term Goals - 04/25/15 1618    PT LONG TERM GOAL #1   Title She is independent with all HEP issued as of last visit   Status Achieved   PT LONG TERM GOAL #2   Title She will report no pain standing 60 min or more.    Status Achieved   PT LONG TERM GOAL #3   Title She will return to work and be able to work for 60-90 min without incr pain.    Status Achieved               Plan - 04/25/15 1616    Clinical  Impression Statement She feels she does not need PT at this tme. She has been pain free at work and home. She is doing HEP and I encouraged her to continue HEP for months to assure pain is supressed. she agreed   PT Next Visit Plan Discharge today with HEP   Consulted and Agree with Plan of Care Patient        Problem List Patient Active Problem List   Diagnosis Date Noted  . Piriformis syndrome of right side 01/31/2015  . Allergic reaction caused by a drug 01/27/2015  . Heel spur 01/16/2015  . Essential hypertension, benign 01/16/2015  . Benign essential HTN 05/31/2014  . Plantar fasciitis 05/31/2014  . Other specified hypothyroidism 05/31/2014  . Intramural leiomyoma of uterus 05/31/2014  . Menorrhagia 01/04/2013  . Fibroids 01/04/2013    Darrel Hoover  PT  04/25/2015, 4:20 PM  [facility] [address] [place], [place], [address] Phone: [phone]   Fax:  [phone]    PHYSICAL THERAPY DISCHARGE SUMMARY  Visits from Start of Care: 9  Current functional level related to goals / functional outcomes: See above   Remaining deficits: None   Education / Equipment: HEP  Plan: Patient agrees to discharge.  Patient goals were met. Patient is being discharged due to meeting the stated rehab goals.  ?????    Pearson Forster, PT 04/25/2015 4:21 PM Phone: [phone] Fax: [phone]

## 2015-04-25 NOTE — Patient Instructions (Signed)
We discussed progress and now she is essentially pain free at home and work as she has changed job duties at work. She felt she did not need to continue with PT.

## 2015-05-01 ENCOUNTER — Encounter: Payer: No Typology Code available for payment source | Admitting: Sports Medicine

## 2015-05-11 ENCOUNTER — Ambulatory Visit: Payer: No Typology Code available for payment source | Attending: Internal Medicine

## 2015-05-11 DIAGNOSIS — Z23 Encounter for immunization: Secondary | ICD-10-CM | POA: Insufficient documentation

## 2015-05-11 DIAGNOSIS — Z0000 Encounter for general adult medical examination without abnormal findings: Secondary | ICD-10-CM

## 2015-05-11 NOTE — Progress Notes (Signed)
Pt's here for flu shot. Pt reports feeling well with no fever.

## 2015-05-15 ENCOUNTER — Ambulatory Visit (INDEPENDENT_AMBULATORY_CARE_PROVIDER_SITE_OTHER): Payer: No Typology Code available for payment source | Admitting: Sports Medicine

## 2015-05-15 ENCOUNTER — Encounter: Payer: Self-pay | Admitting: Sports Medicine

## 2015-05-15 VITALS — BP 121/81 | HR 79 | Ht 67.0 in | Wt 179.0 lb

## 2015-05-15 DIAGNOSIS — M722 Plantar fascial fibromatosis: Secondary | ICD-10-CM

## 2015-05-15 NOTE — Progress Notes (Signed)
Patient ID: Heidi Barrett, female   DOB: [birth year], 49 y.o.   MRN: [id number]  Patient comes in today for custom orthotics. She has a history of left foot plantar fasciitis. Her heel pain has resolved but she has some pain along the lateral aspect of her foot. No swelling. She has found the green sports insoles and scaphoid pads to be comfortable and she is now ready to transition to custom orthotics.  Physical examination:  Left foot: There is no tenderness to palpation at the calcaneal insertion of the plantar fascia. Negative calcaneal squeeze. Slight tenderness diffusely along the lateral aspect of the foot but no soft tissue swelling. Neurovascular intact distally.  Assessment/plan:  Left foot pain secondary to plantar fasciitis  Patient would benefit from custom orthotics. Custom orthotics were constructed today. 30 minutes was spent with the patient with greater than 50% of the time spent in face-to-face consultation discussing her diagnosis, orthotic construction, instruction, and fitting. Patient found her orthotics to be comfortable prior to leaving the office. Follow-up as needed.  Patient was fitted for a : standard, cushioned, semi-rigid orthotic. The orthotic was heated and afterward the patient stood on the orthotic blank positioned on the orthotic stand. The patient was positioned in subtalar neutral position and 10 degrees of ankle dorsiflexion in a weight bearing stance. After completion of molding, a stable base was applied to the orthotic blank. The blank was ground to a stable position for weight bearing. Size:8 Base: blue EVA Posting: none Additional orthotic padding: none

## 2015-05-17 ENCOUNTER — Ambulatory Visit: Payer: No Typology Code available for payment source

## 2015-05-24 ENCOUNTER — Ambulatory Visit: Payer: No Typology Code available for payment source | Attending: Internal Medicine

## 2015-07-26 NOTE — Telephone Encounter (Signed)
error 

## 2015-11-10 ENCOUNTER — Encounter (HOSPITAL_COMMUNITY): Payer: Self-pay | Admitting: *Deleted

## 2015-11-10 ENCOUNTER — Emergency Department (HOSPITAL_COMMUNITY)
Admission: EM | Admit: 2015-11-10 | Discharge: 2015-11-10 | Disposition: A | Discharge status: Home / Self Care | Payer: No Typology Code available for payment source | Attending: Emergency Medicine | Admitting: Emergency Medicine

## 2015-11-10 DIAGNOSIS — H9202 Otalgia, left ear: Secondary | ICD-10-CM

## 2015-11-10 DIAGNOSIS — R05 Cough: Secondary | ICD-10-CM | POA: Insufficient documentation

## 2015-11-10 DIAGNOSIS — Z7952 Long term (current) use of systemic steroids: Secondary | ICD-10-CM | POA: Insufficient documentation

## 2015-11-10 DIAGNOSIS — Z79899 Other long term (current) drug therapy: Secondary | ICD-10-CM | POA: Insufficient documentation

## 2015-11-10 DIAGNOSIS — E079 Disorder of thyroid, unspecified: Secondary | ICD-10-CM | POA: Insufficient documentation

## 2015-11-10 DIAGNOSIS — Z862 Personal history of diseases of the blood and blood-forming organs and certain disorders involving the immune mechanism: Secondary | ICD-10-CM | POA: Insufficient documentation

## 2015-11-10 DIAGNOSIS — I10 Essential (primary) hypertension: Secondary | ICD-10-CM | POA: Insufficient documentation

## 2015-11-10 MED ORDER — IBUPROFEN 600 MG PO TABS: 600.0000 mg | ORAL_TABLET | Freq: Four times a day (QID) | ORAL | Status: DC | PRN

## 2015-11-10 MED ORDER — NEOMYCIN-POLYMYXIN-HC 3.5-10000-1 OT SUSP
4.0000 [drp] | Freq: Three times a day (TID) | OTIC | Status: DC
  Administered 2015-11-10: 4 [drp] via OTIC
  Filled 2015-11-10: qty 10

## 2015-11-10 MED ORDER — NEOMYCIN-COLIST-HC-THONZONIUM 3.3-3-10-0.5 MG/ML OT SUSP
4.0000 [drp] | Freq: Four times a day (QID) | OTIC | Status: DC
  Filled 2015-11-10: qty 5

## 2015-11-10 NOTE — ED Notes (Signed)
Pt states she was cleaning her L ear with a q-tip last night and feels she has a piece of the cotton still stuck in it.

## 2015-11-10 NOTE — ED Notes (Signed)
See PA assessment 

## 2015-11-10 NOTE — ED Provider Notes (Signed)
CSN: [id number]     Arrival date & time 11/10/15  1549 History  By signing my name below, I, Heidi Barrett, attest that this documentation has been prepared under the direction and in the presence of [facility], NP-C. Electronically Signed: Irene Barrett, ED Scribe. 11/10/2015. 4:16 PM.   Chief Complaint  Patient presents with  . Otalgia   Patient is a 50 y.o. female presenting with ear pain. The history is provided by the patient. No language interpreter was used.  Otalgia Location:  Left Behind ear:  No abnormality Quality:  Aching Severity:  Mild Onset quality:  Sudden Duration:  1 day Timing:  Constant Progression:  Worsening Context: foreign body   Ineffective treatments:  OTC medications Associated symptoms: cough   Associated symptoms: no fever   HPI Comments: Heidi Barrett is a 50 y.o. Female with a hx of HTN and thyroid disease who presents to the Emergency Department complaining of left otalgia onset one day ago. Pt reports mild productive cough. Pt states that she was cleaning her ear with a Q-Tip yesterday and feels like there is still some cotton from it stuck in her ear. She rates her pain 8/10 and reports a "popping" sound in her ear. Pt took Tylenol for her pain to no relief. She denies fever or chills.   Past Medical History  Diagnosis Date  . Thyroid disease   . Hypertension   . Anemia    History reviewed. No pertinent past surgical history. Family History  Problem Relation Age of Onset  . Heart disease Mother   . Stroke Mother   . Cancer Father     prostate   Social History  Substance Use Topics  . Smoking status: Never Smoker   . Smokeless tobacco: Never Used  . Alcohol Use: No   OB History    Gravida Para Term Preterm AB TAB SAB Ectopic Multiple Living   0              Review of Systems  Constitutional: Negative for fever and chills.  HENT: Positive for ear pain.   Respiratory: Positive for cough.   All other systems reviewed and are  negative.     Allergies  Hydrocodone; Percocet; and Tramadol  Home Medications   Prior to Admission medications   Medication Sig Start Date End Date Taking? Authorizing Provider  amLODipine (NORVASC) 10 MG tablet One tab po for BP 01/16/15   [name], MD  calcium carbonate (CALCIUM 600) 600 MG TABS tablet Take 1 tablet (600 mg total) by mouth daily with breakfast. 05/31/14   [name], MD  cholecalciferol (VITAMIN D) 1000 UNITS tablet Take 1 tablet (1,000 Units total) by mouth daily. 05/31/14   [name], MD  EPINEPHrine 0.3 mg/0.3 mL IJ SOAJ injection Inject 0.3 mLs (0.3 mg total) into the muscle once. 01/30/15   [name], MD  hydrochlorothiazide (HYDRODIURIL) 12.5 MG tablet Take 1 tablet (12.5 mg total) by mouth daily. 01/16/15   [name], MD  ibuprofen (ADVIL,MOTRIN) 600 MG tablet Take 1 tablet (600 mg total) by mouth every 6 (six) hours as needed. 11/10/15   Hope Bunnie Pion, NP  imiquimod (ALDARA) 5 % cream Apply topically 3 (three) times a week. Patient not taking: Reported on 01/27/2015 01/04/13   Lavonia Drafts, MD  isosorbide mononitrate (IMDUR) 30 MG 24 hr tablet Take 1 tablet (30 mg total) by mouth daily. 01/16/15   [name], MD  levothyroxine (SYNTHROID, LEVOTHROID)  150 MCG tablet Take 1 tablet (150 mcg total) by mouth daily before breakfast. 01/20/15   [name], MD  Multiple Vitamin (MULTIVITAMIN WITH MINERALS) TABS tablet Take 1 tablet by mouth daily. 05/31/14   [name], MD  predniSONE (DELTASONE) 20 MG tablet Take 1 tablet (20 mg total) by mouth daily with breakfast. 01/27/15   [name], MD  traMADol (ULTRAM) 50 MG tablet Take 1 tablet (50 mg total) by mouth every 6 (six) hours as needed. Patient not taking: Reported on 01/27/2015 01/16/15   [name], MD   BP 129/83 mmHg  Pulse 80  Temp(Src) 98.4 F (36.9 C) (Oral)  Resp 15  Ht 5' 7.5" (1.715 m)  Wt 80.74 kg  BMI 27.45  kg/m2  SpO2 100%  LMP 10/29/2015 Physical Exam  Constitutional: She is oriented to person, place, and time. She appears well-developed and well-nourished. No distress.  HENT:  Head: Normocephalic and atraumatic.  Right Ear: Tympanic membrane normal.  Left Ear: Tympanic membrane normal.  Mouth/Throat: Oropharynx is clear and moist. No oropharyngeal exudate.  Left ear canal with irritation  Eyes: Conjunctivae and EOM are normal. Pupils are equal, round, and reactive to light.  Neck: Normal range of motion. Neck supple.  Musculoskeletal: Normal range of motion.  Neurological: She is alert and oriented to person, place, and time.  Skin: Skin is warm and dry.  Psychiatric: She has a normal mood and affect. Her behavior is normal.    ED Course  Procedures (including critical care time) DIAGNOSTIC STUDIES: Oxygen Saturation is 100% on RA, normal by my interpretation.    COORDINATION OF CARE: 4:15 PM-Discussed treatment plan which includes ear drops and ibuprofen with pt at bedside and pt agreed to plan.     MDM  50 y.o. female with left ear pain after cleaning her ear with a Q-tip.  At this time there does not appear to be any evidence of an acute emergency medical condition and the patient appears stable for discharge with appropriate outpatient follow up.Diagnosis was discussed with patient who verbalizes understanding and is agreeable to discharge.   Final diagnoses:  Left ear pain   I personally performed the services described in this documentation, which was scribed in my presence. The recorded information has been reviewed and is accurate.   [address] [place], NP 11/10/15 1713  Charlesetta Shanks, MD 11/17/15 [phone]

## 2015-11-10 NOTE — ED Notes (Signed)
Pt ambulates independently and with steady gait at time of discharge. Discharge instructions and follow up information reviewed with patient. No other questions or concerns voiced at this time. RX x 2. 

## 2015-11-15 ENCOUNTER — Other Ambulatory Visit: Payer: Self-pay | Admitting: Internal Medicine

## 2015-11-15 DIAGNOSIS — E038 Other specified hypothyroidism: Secondary | ICD-10-CM

## 2015-11-15 MED ORDER — LEVOTHYROXINE SODIUM 150 MCG PO TABS: 150.0000 ug | ORAL_TABLET | Freq: Every day | ORAL | Status: DC

## 2015-12-01 ENCOUNTER — Ambulatory Visit: Payer: No Typology Code available for payment source | Attending: Internal Medicine

## 2016-01-19 ENCOUNTER — Other Ambulatory Visit: Payer: Self-pay | Admitting: Internal Medicine

## 2016-01-31 ENCOUNTER — Other Ambulatory Visit: Payer: Self-pay | Admitting: Internal Medicine

## 2016-02-08 ENCOUNTER — Ambulatory Visit: Payer: No Typology Code available for payment source | Attending: Internal Medicine | Admitting: Internal Medicine

## 2016-02-08 VITALS — HR 83 | Temp 98.4°F | Resp 16 | Ht 68.0 in | Wt 189.4 lb

## 2016-02-08 DIAGNOSIS — I10 Essential (primary) hypertension: Secondary | ICD-10-CM

## 2016-02-08 DIAGNOSIS — R253 Fasciculation: Secondary | ICD-10-CM | POA: Insufficient documentation

## 2016-02-08 DIAGNOSIS — R1031 Right lower quadrant pain: Secondary | ICD-10-CM

## 2016-02-08 DIAGNOSIS — E038 Other specified hypothyroidism: Secondary | ICD-10-CM

## 2016-02-08 DIAGNOSIS — D259 Leiomyoma of uterus, unspecified: Secondary | ICD-10-CM | POA: Insufficient documentation

## 2016-02-08 DIAGNOSIS — R259 Unspecified abnormal involuntary movements: Secondary | ICD-10-CM

## 2016-02-08 MED ORDER — CIPROFLOXACIN HCL 500 MG PO TABS: 500.0000 mg | ORAL_TABLET | Freq: Two times a day (BID) | ORAL | Status: AC

## 2016-02-08 MED ORDER — METRONIDAZOLE 500 MG PO TABS: 500.0000 mg | ORAL_TABLET | Freq: Two times a day (BID) | ORAL | Status: AC

## 2016-02-08 MED ORDER — ISOSORBIDE MONONITRATE ER 30 MG PO TB24: 30.0000 mg | ORAL_TABLET | Freq: Every day | ORAL | Status: DC

## 2016-02-08 MED ORDER — AMLODIPINE BESYLATE 10 MG PO TABS: 10.0000 mg | ORAL_TABLET | Freq: Every day | ORAL | Status: DC

## 2016-02-08 MED ORDER — LEVOTHYROXINE SODIUM 150 MCG PO TABS: 150.0000 ug | ORAL_TABLET | Freq: Every day | ORAL | Status: DC

## 2016-02-08 NOTE — Progress Notes (Signed)
Patient c/o abdominal pain RLQ/month x (1) month.  LBM: 02/08/16 Has tried and failed OTC meds for discomfort.  Refill Synthroid/isosorbide. Patient is fasting.

## 2016-02-08 NOTE — Patient Instructions (Signed)

## 2016-02-08 NOTE — Progress Notes (Signed)
Patient ID: Heidi Barrett, female   DOB: 23-Aug-1965, 50 y.o.   MRN: [id number]   Heidi Barrett, is a 50 y.o. female  [id number]  [id number]  DOB - Feb 21, 1966  Chief Complaint  Patient presents with  . Abdominal Pain    f/u medications        Subjective:   Heidi Barrett is a 50 y.o. female with history of hypertension, hypothyroidism and chronic anemia from DUB secondary to multiple uterine fibroids here today for a sick visit. Patient has not been seen here for over a year. She came in today with major complaint of abdominal pain, located in the RLQ for over a month. She has failed OTC meds. She continues to have DUB but this pain is different from the ones related to her fibroids. She has no fever, no urinary symptom, she occasionally have associated nausea but no vomiting, no diarrhea although her stool is occasionally mucoid and watery, no abdominal distension. Patient has No headache, No chest pain, No new weakness tingling or numbness, No Cough - SOB. She needs refill of her medications.   Problem  Right Lower Quadrant Abdominal Pain  Eye Muscle Twitches  Fibroid, Uterine    ALLERGIES: Allergies  Allergen Reactions  . Hydrocodone Nausea And Vomiting    dizzines  . Percocet [Oxycodone-Acetaminophen]   . Tramadol     PAST MEDICAL HISTORY: Past Medical History  Diagnosis Date  . Thyroid disease   . Hypertension   . Anemia     MEDICATIONS AT HOME: Prior to Admission medications   Medication Sig Start Date End Date Taking? Authorizing Provider  amLODipine (NORVASC) 10 MG tablet Take 1 tablet (10 mg total) by mouth daily. For blood pressure. Needs office visit. 02/08/16  Yes [name], MD  calcium carbonate (CALCIUM 600) 600 MG TABS tablet Take 1 tablet (600 mg total) by mouth daily with breakfast. 05/31/14  Yes [name], MD  cholecalciferol (VITAMIN D) 1000 UNITS tablet Take 1 tablet (1,000 Units total) by mouth daily. 05/31/14  Yes  [name], MD  EPINEPHrine 0.3 mg/0.3 mL IJ SOAJ injection Inject 0.3 mLs (0.3 mg total) into the muscle once. 01/30/15  Yes [name], MD  isosorbide mononitrate (IMDUR) 30 MG 24 hr tablet Take 1 tablet (30 mg total) by mouth daily. Needs office visit 02/08/16  Yes [name], MD  levothyroxine (SYNTHROID, LEVOTHROID) 150 MCG tablet Take 1 tablet (150 mcg total) by mouth daily before breakfast. 02/08/16  Yes [name], MD  Multiple Vitamin (MULTIVITAMIN WITH MINERALS) TABS tablet Take 1 tablet by mouth daily. 05/31/14  Yes [name], MD  ciprofloxacin (CIPRO) 500 MG tablet Take 1 tablet (500 mg total) by mouth 2 (two) times daily. 02/08/16 02/16/16  [name], MD  ibuprofen (ADVIL,MOTRIN) 600 MG tablet Take 1 tablet (600 mg total) by mouth every 6 (six) hours as needed. Patient not taking: Reported on 02/08/2016 11/10/15   Ashley Murrain, NP  imiquimod [place]) 5 % cream Apply topically 3 (three) times a week. Patient not taking: Reported on 01/27/2015 01/04/13   Lavonia Drafts, MD  metroNIDAZOLE (FLAGYL) 500 MG tablet Take 1 tablet (500 mg total) by mouth 2 (two) times daily. 02/08/16 02/16/16  [name], MD     Objective:   Filed Vitals:   02/08/16 0913  Pulse: 83  Temp: 98.4 F (36.9 C)  TempSrc: Oral  Resp: 16  Height: 5\' 8"  (1.727 m)  Weight: 189 lb  6.4 oz (85.911 kg)  SpO2: 100%    Exam General appearance : Awake, alert, not in any distress. Speech Clear. Not toxic looking HEENT: Atraumatic and Normocephalic, pupils equally reactive to light and accomodation Neck: Supple, no JVD. No cervical lymphadenopathy.  Chest: Good air entry bilaterally, no added sounds  CVS: S1 S2 regular, no murmurs.  Abdomen: Right lower quadrant tenderness and guarding (voluntary), enlarged and firm/hard uterus, mild suprapubic discomfort, Bowel sounds present, no rigidity or rebound. Extremities: B/L Lower Ext shows no edema, both legs are  warm to touch Neurology: Awake alert, and oriented X 3, CN II-XII intact, Non focal Skin: No Rash  Data Review Lab Results  Component Value Date   HGBA1C 4.5 05/31/2014     Assessment & Plan   1. Essential hypertension: controlled Refill - amLODipine (NORVASC) 10 MG tablet; Take 1 tablet (10 mg total) by mouth daily. For blood pressure. Needs office visit.  Dispense: 90 tablet; Refill: 3 - isosorbide mononitrate (IMDUR) 30 MG 24 hr tablet; Take 1 tablet (30 mg total) by mouth daily. Needs office visit  Dispense: 90 tablet; Refill: 3  We have discussed target BP range and blood pressure goal. I have advised patient to check BP regularly and to call us back or report to clinic if the numbers are consistently higher than 140/90. We discussed the importance of compliance with medical therapy and DASH diet recommended, consequences of uncontrolled hypertension discussed.  - continue current BP medications  2. Right lower quadrant abdominal pain: With mucoid stool, sometimes watery, will treat for Colitis and order CT abd  - ciprofloxacin (CIPRO) 500 MG tablet; Take 1 tablet (500 mg total) by mouth 2 (two) times daily.  Dispense: 14 tablet; Refill: 0 - metroNIDAZOLE (FLAGYL) 500 MG tablet; Take 1 tablet (500 mg total) by mouth 2 (two) times daily.  Dispense: 14 tablet; Refill: 0  - CT Abdomen Pelvis Wo Contrast; Future  3. Uterine leiomyoma, unspecified location  Patient will follow up with her gynecologist as scheduled  4. Eye muscle twitches: With blurry vision, patient has history of "torn retina" on the left, she was advised repair but she could not afford it at the time.  - Ambulatory referral to Ophthalmology  5. Other specified hypothyroidism Refill - levothyroxine (SYNTHROID, LEVOTHROID) 150 MCG tablet; Take 1 tablet (150 mcg total) by mouth daily before breakfast.  Dispense: 90 tablet; Refill: 3  Patient have been counseled extensively about nutrition and exercise  Return  in about 6 months (around 08/10/2016) for Abdominal Pain, Generalized Anxiety Disorder.  The patient was given clear instructions to go to ER or return to medical center if symptoms don't improve, worsen or new problems develop. The patient verbalized understanding. The patient was told to call to get lab results if they haven't heard anything in the next week.   This note has been created with Surveyor, quantity. Any transcriptional errors are unintentional.    Angelica Chessman, MD, [place], Karilyn Cota, [place], [place]   02/08/2016, 9:46 AM

## 2016-02-13 ENCOUNTER — Ambulatory Visit (HOSPITAL_COMMUNITY)
Admission: RE | Admit: 2016-02-13 | Discharge: 2016-02-13 | Disposition: A | Discharge status: Home / Self Care | Payer: No Typology Code available for payment source | Source: Ambulatory Visit | Attending: Internal Medicine | Admitting: Internal Medicine

## 2016-02-13 DIAGNOSIS — N852 Hypertrophy of uterus: Secondary | ICD-10-CM | POA: Insufficient documentation

## 2016-02-13 DIAGNOSIS — R1031 Right lower quadrant pain: Secondary | ICD-10-CM

## 2016-02-13 MED ORDER — DIATRIZOATE MEGLUMINE & SODIUM 66-10 % PO SOLN
ORAL | Status: AC
  Filled 2016-02-13: qty 30

## 2016-02-16 ENCOUNTER — Telehealth: Payer: Self-pay | Admitting: Internal Medicine

## 2016-02-16 NOTE — Telephone Encounter (Signed)
Pt. Called requesting her CT scan results. Please f/u with pt.

## 2016-02-16 NOTE — Telephone Encounter (Signed)
Patient verified DOB Patient is aware of PCP not yet resulting the scan. MA will call patient upon his return and provide her with the results. Patient expressed her understanding and had no further questions at this time.

## 2016-02-23 ENCOUNTER — Telehealth: Payer: Self-pay | Admitting: *Deleted

## 2016-02-23 ENCOUNTER — Telehealth: Payer: Self-pay | Admitting: Internal Medicine

## 2016-02-23 NOTE — Telephone Encounter (Signed)
Patient verified DOB Patient is aware of CT of abdomen and pelvis showing no acute findings.  Uterus is enlarged consistent with uterine fibroids. Patient is aware of constipation being noted in colon.  Patient is advised to increase fiber and vegetable intake and possibly be prescribed laxatives to assist. Patient advised to follow-up with OB/GYN for fibroids. Patient expressed her understanding and had no further questions at this time.

## 2016-02-23 NOTE — Telephone Encounter (Signed)
Medical Assistant left message on patient's home and cell voicemail. Voicemail states to give a call back to Sheril Hammond with [facility] at [phone].  

## 2016-02-23 NOTE — Telephone Encounter (Signed)
-----   Message from [name], MD sent at 02/21/2016  9:27 AM EDT ----- Please inform patient that her CT abdomen and pelvis showed: 1. No acute findings. 2. Uterus significantly enlarged most consistent with multiple fibroids. Uterine size similar to that measured on the prior ultrasound. 3. Mild to moderate increased stool in the colon (Constipation) Advise patient on increased fibre diet and more vegetable, may need laxatives. Follow up with OBGYN for the uterine fibroids as scheduled.

## 2016-02-23 NOTE — Telephone Encounter (Signed)
Pt called back returning nurse's call to review CT results. Please f/up

## 2016-04-03 ENCOUNTER — Ambulatory Visit: Payer: No Typology Code available for payment source | Admitting: Obstetrics & Gynecology

## 2016-05-20 ENCOUNTER — Ambulatory Visit (INDEPENDENT_AMBULATORY_CARE_PROVIDER_SITE_OTHER): Payer: Self-pay | Admitting: Obstetrics & Gynecology

## 2016-05-20 ENCOUNTER — Other Ambulatory Visit (HOSPITAL_COMMUNITY)
Admission: RE | Admit: 2016-05-20 | Discharge: 2016-05-20 | Disposition: A | Discharge status: Home / Self Care | Payer: No Typology Code available for payment source | Source: Ambulatory Visit | Attending: Obstetrics & Gynecology | Admitting: Obstetrics & Gynecology

## 2016-05-20 ENCOUNTER — Encounter: Payer: Self-pay | Admitting: Obstetrics & Gynecology

## 2016-05-20 VITALS — BP 141/100 | HR 93 | Wt 188.5 lb

## 2016-05-20 DIAGNOSIS — N921 Excessive and frequent menstruation with irregular cycle: Secondary | ICD-10-CM

## 2016-05-20 DIAGNOSIS — Z124 Encounter for screening for malignant neoplasm of cervix: Secondary | ICD-10-CM

## 2016-05-20 DIAGNOSIS — Z1151 Encounter for screening for human papillomavirus (HPV): Secondary | ICD-10-CM

## 2016-05-20 DIAGNOSIS — Z01419 Encounter for gynecological examination (general) (routine) without abnormal findings: Secondary | ICD-10-CM

## 2016-05-20 LAB — POCT PREGNANCY, URINE: Preg Test, Ur: NEGATIVE

## 2016-05-20 NOTE — Patient Instructions (Signed)
Uterine Artery Embolization for Fibroids Uterine artery embolization is a nonsurgical treatment to shrink fibroids. A thin plastic tube (catheter) is used to inject material that blocks off the blood supply to the fibroid, which causes the fibroid to shrink. LET [facility] CARE PROVIDER KNOW ABOUT:  Any allergies you have.  All medicines you are taking, including vitamins, herbs, eye drops, creams, and over-the-counter medicines.  Previous problems you or members of your family have had with the use of anesthetics.  Any blood disorders you have.  Previous surgeries you have had.  Medical conditions you have. RISKS AND COMPLICATIONS  Injury to the uterus from decreased blood supply  Infection.  Blood infection (septicemia).  Lack of menstrual periods (amenorrhea).  Death of tissue cells (necrosis) around your bladder or vulva.  Development of a hole between organs or from an organ to the surface of your skin (fistula).  Blood clot in the legs (deep vein thrombosis) or lung (pulmonary embolus). BEFORE THE PROCEDURE  Ask your health care provider about changing or stopping your regular medicines.   Do not take aspirin or blood thinners (anticoagulants) for 1 week before the surgery or as directed by your health care provider.  Do not eat or drink anything for 8 hours before the surgery or as directed by your health care provider.   Empty your bladder before the procedure begins. PROCEDURE   An IV tube will be placed into one of your veins. This will be used to give you a sedative and pain medication (conscious sedation).  You will be given a medicine that numbs the area (local anesthetic).  A small cut will be made in your groin. A catheter is then inserted into the main artery of your leg.  The catheter will be guided through the artery to your uterus. A series of images will be taken while dye is injected through the catheter in your groin. X-rays are taken at the  same time. This is done to provide a road map of the blood supply to your uterus and fibroids.  Tiny plastic spheres, about the size of sand grains, will be injected through the catheter. Metal coils may be used to help block the artery. The particles will lodge in tiny branches of the uterine artery that supplies blood to the fibroids.  The procedure is repeated on the artery that supplies the other side of the uterus.  The catheter is then removed and pressure is held to stop any bleeding. No stitches are needed.  A dressing is then placed over the cut (incision). AFTER THE PROCEDURE  You will be taken to a recovery area where your progress will be monitored until you are awake, stable, and taking fluids well. If there are no other problems, you will then be moved to a regular hospital room.  You will be observed overnight in the hospital.  You will have cramping that should be controlled with pain medication.   This information is not intended to replace advice given to you by your health care provider. Make sure you discuss any questions you have with your health care provider.   Document Released: 10/07/2005 Document Revised: 05/12/2013 Document Reviewed: 02/04/2013 Elsevier Interactive Patient Education [employer].

## 2016-05-20 NOTE — Progress Notes (Addendum)
History:  50 y.o. G0P0 here today for reeval of AUB.  She reports that she conts to have heavy bleeding with bleeding between menses. She reports that she has been unable to f/u due to caring for her mother.  She reprots some pain with her cycles and pressure. She reports that she wants to 'maintain' her fertility. She is not currently planning on conceiving.  She denies weight loss or fever or chills etc.  The following portions of the patient's history were reviewed and updated as appropriate: allergies, current medications, past family history, past medical history, past social history, past surgical history and problem list.  Review of Systems:  Pertinent items are noted in HPI.  Objective:  Physical Exam Blood pressure (!) 141/100, pulse 93, weight 188 lb 8 oz (85.5 kg), last menstrual period 05/17/2016. Gen: NAD Lungs: CTA CV: RRR Abd: Soft, nontender and nondistended Pelvic: Normal appearing external genitalia; normal appearing vaginal mucosa and cervix.  Normal discharge.  enlargedl uterus- wide base, no other palpable masses, no uterine or adnexal tenderness   The indications for endometrial biopsy were reviewed.   Risks of the biopsy including cramping, bleeding, infection, uterine perforation, inadequate specimen and need for additional procedures  were discussed. The patient states she understands and agrees to undergo procedure today. Consent was signed. Time out was performed. Urine HCG was negative. A sterile speculum was placed in the patient's vagina and the cervix was prepped with Betadine. A single-toothed tenaculum was placed on the anterior lip of the cervix to stabilize it. The 3 mm pipelle was introduced into the endometrial cavity without difficulty to a depth of 15 cm, and a moderate amount of tissue was obtained and sent to pathology. The instruments were removed from the patient's vagina. Minimal bleeding from the cervix was noted. The patient tolerated the procedure  well. Routine post-procedure instructions were given to the patient. The patient will follow up to review the results and for further management.     Labs and Imaging 01/2013 Diagnosis Endometrium, biopsy - SECRETORY ENDOMETRIUM. NO HYPERPLASIA OR CARCINOMA.  01/04/2013 Adequacy Reason Satisfactory for evaluation, endocervical/transformation zone component PRESENT. Diagnosis NEGATIVE FOR INTRAEPITHELIAL LESIONS OR MALIGNANCY. TANYA SPEED Cytotechnologist Electronic Signature (Case signed 01/06/2013) Source CervicoVaginal Pap [ThinPrep Imaged] Ancillary Testing Chlamydia T. CT: Negative Completed by JK on 2013-01-05 HPV High Risk High Risk HPV: NOT DETECTED Assessment & Plan:  AUB pt with uterine fibroids  1. Well woman exam with routine gynecological exam - Cytology - PAP  2. Menorrhagia with irregular cycle - Surgical pathology from endo biopsy - CBC - TSH Referral to IR Radiologist Eval & Mgmt; Future  3. Elevated BP  Referral for mammogram grant  Klare Criss L. Harraway-Smith, M.D., [name]

## 2016-05-21 ENCOUNTER — Telehealth: Payer: Self-pay

## 2016-05-21 LAB — CBC
HCT: 37.3 % (ref 35.0–45.0)
HEMOGLOBIN: 12.7 g/dL (ref 11.7–15.5)
MCH: 30.8 pg (ref 27.0–33.0)
MCHC: 34 g/dL (ref 32.0–36.0)
MCV: 90.3 fL (ref 80.0–100.0)
MPV: 12.9 fL — AB (ref 7.5–12.5)
Platelets: 129 10*3/uL — ABNORMAL LOW (ref 140–400)
RBC: 4.13 MIL/uL (ref 3.80–5.10)
RDW: 12.7 % (ref 11.0–15.0)
WBC: 3.9 10*3/uL (ref 3.8–10.8)

## 2016-05-21 LAB — TSH: TSH: 3.36 m[IU]/L

## 2016-05-21 NOTE — Telephone Encounter (Signed)
LM for IR to return call back to schedule appt.

## 2016-05-22 ENCOUNTER — Encounter: Payer: Self-pay | Admitting: Obstetrics & Gynecology

## 2016-05-22 LAB — CYTOLOGY - PAP
Diagnosis: NEGATIVE
HPV (WINDOPATH): NOT DETECTED

## 2016-05-22 NOTE — Telephone Encounter (Signed)
Pt returned call and I informed her of her IR appt.and gave contact information. Pt stated understanding with no further questions.

## 2016-05-22 NOTE — Telephone Encounter (Signed)
Contacted [facility] and scheduled appt with Jocelyn Lamer from [place], [place] [address], 1st floor, for October 24th @ 0845.  LM for pt to return call in regards to receiving her appt information.

## 2016-05-28 ENCOUNTER — Other Ambulatory Visit: Payer: Self-pay | Admitting: Obstetrics & Gynecology

## 2016-05-28 ENCOUNTER — Ambulatory Visit
Admission: RE | Admit: 2016-05-28 | Discharge: 2016-05-28 | Disposition: A | Discharge status: Home / Self Care | Payer: No Typology Code available for payment source | Source: Ambulatory Visit | Attending: Obstetrics & Gynecology | Admitting: Obstetrics & Gynecology

## 2016-05-28 DIAGNOSIS — N921 Excessive and frequent menstruation with irregular cycle: Secondary | ICD-10-CM

## 2016-05-28 HISTORY — PX: IR GENERIC HISTORICAL: IMG1180011

## 2016-05-28 NOTE — Consult Note (Signed)
Chief Complaint: Patient was seen in consultation today for  Chief Complaint  Patient presents with  . Fibroids    Consult for [place]     at the request of Harraway-Smith,Carolyn  Referring Physician(s): Harraway-Smith,Carolyn  History of Present Illness: Heidi Barrett is a 50 y.o. female with evidence for uterine fibroids based on prior imaging. Pregnancy history is G0, P0. The patient main complaint is heavy menstrual bleeding. 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 According to the patient, during the summer, her periods were regular every 20-21 days with heavy menstrual bleeding lasting 2 out of the 5 days. Now that she is back to work, she says that the menstrual periods are occurring every 11 days. The bleeding length is still 5 days with 2 days of heavy bleeding and clotting. The bleeding was so heavy that she is changing tampons and pads every 1-2 hours. During the bleeding, she is restricted to where she can ago and sometimes feels like she can't even go to church because she is worried about the potential problems with the heavy bleeding. Her other main complaint is frequent urination during the day and getting up at night. She does not have significant cramping. In the past, she was treated with the hormonal therapy and the symptoms resolved when she was on that hormonal therapy. Patient is still considering the idea of getting pregnant although she says that her gynecologist would not recommend that based on her fibroid disease. Patient's past medical history significant for hypertension and she did have an episode of chest pain which resulted in a negative coronary artery catheterization according to the patient. Patient has also had thyroid ablation procedures for hyperthyroidism. Patient had a negative Pap smear on 05/20/2016 and a negative endometrial biopsy on 05/20/2016.   Past Medical History:  Diagnosis Date  . Anemia   . Hypertension     . Thyroid disease     No past surgical history on file.  Allergies: Hctz [hydrochlorothiazide]; Hydrocodone; Percocet [oxycodone-acetaminophen]; and Tramadol  Medications: Prior to Admission medications   Medication Sig Start Date End Date Taking? Authorizing Provider  amLODipine (NORVASC) 10 MG tablet Take 1 tablet (10 mg total) by mouth daily. For blood pressure. Needs office visit. 02/08/16  Yes [name], MD  calcium carbonate (CALCIUM 600) 600 MG TABS tablet Take 1 tablet (600 mg total) by mouth daily with breakfast. 05/31/14  Yes [name], MD  cholecalciferol (VITAMIN D) 1000 UNITS tablet Take 1 tablet (1,000 Units total) by mouth daily. 05/31/14  Yes [name], MD  ibuprofen (ADVIL,MOTRIN) 600 MG tablet Take 1 tablet (600 mg total) by mouth every 6 (six) hours as needed. 11/10/15  Yes Hope Bunnie Pion, NP  isosorbide mononitrate (IMDUR) 30 MG 24 hr tablet Take 1 tablet (30 mg total) by mouth daily. Needs office visit 02/08/16  Yes [name], MD  levothyroxine (SYNTHROID, LEVOTHROID) 150 MCG tablet Take 1 tablet (150 mcg total) by mouth daily before breakfast. 02/08/16  Yes [name], MD  Multiple Vitamin (MULTIVITAMIN WITH MINERALS) TABS tablet Take 1 tablet by mouth daily. 05/31/14  Yes [name], MD  EPINEPHrine 0.3 mg/0.3 mL IJ SOAJ injection Inject 0.3 mLs (0.3 mg total) into the muscle once. Patient not taking: Reported on 05/28/2016 01/30/15   [name], MD  imiquimod (ALDARA) 5 % cream Apply topically 3 (three) times a week. Patient not taking: Reported on 05/28/2016 01/04/13  Lavonia Drafts, MD     Family History  Problem Relation Age of Onset  . Heart disease Mother   . Stroke Mother   . Cancer Father     prostate    Social History   Social History  . Marital status: Single    Spouse name: N/A  . Number of children: N/A  . Years of education: N/A   Social History Main Topics  . Smoking status:  Never Smoker  . Smokeless tobacco: Never Used  . Alcohol use No  . Drug use: No  . Sexual activity: No   Other Topics Concern  . Not on file   Social History Narrative  . No narrative on file      Review of Systems  Constitutional: Positive for fatigue. Negative for activity change and appetite change.  Gastrointestinal: Positive for abdominal distention. Negative for abdominal pain.  Genitourinary: Positive for frequency and vaginal bleeding.    Vital Signs: BP (!) 142/89 (BP Location: Left Arm, Patient Position: Sitting, Cuff Size: Normal)   Pulse 76   Temp 98.4 F (36.9 C) (Oral)   Resp 14   Ht 5\' 8"  (1.727 m)   Wt 188 lb (85.3 kg)   LMP 05/14/2016 (Exact Date)   SpO2 100%   BMI 28.59 kg/m   Physical Exam  Constitutional: She appears well-developed and well-nourished.  Cardiovascular: Normal rate, regular rhythm and intact distal pulses.  Exam reveals no gallop and no friction rub.   No murmur heard. Pulmonary/Chest: Effort normal and breath sounds normal.  Abdominal: Soft. Bowel sounds are normal. She exhibits no distension. There is no tenderness.        Imaging: No results found.  Labs:  CBC:  Recent Labs  05/20/16 1653  WBC 3.9  HGB 12.7  HCT 37.3  PLT 129*    COAGS: No results for input(s): INR, APTT in the last 8760 hours.  BMP: No results for input(s): NA, K, CL, CO2, GLUCOSE, BUN, CALCIUM, CREATININE, GFRNONAA, GFRAA in the last 8760 hours.  Invalid input(s): CMP  LIVER FUNCTION TESTS: No results for input(s): BILITOT, AST, ALT, ALKPHOS, PROT, ALBUMIN in the last 8760 hours.  TUMOR MARKERS: No results for input(s): AFPTM, CEA, CA199, CHROMGRNA in the last 8760 hours.  Assessment and Plan:   50 year old female with a primary complaint of menorrhagia and urinary frequency. I reviewed prior CT from 02/13/2016 which demonstrates an enlarged uterus and probable fibroids. Patient's symptoms are compatible with fibroid disease. We  discussed fibroid treatment options including hysterectomy, hormonal therapy, waiting till menopause and uterine artery embolization procedure. We discussed uterine artery embolization procedure in depth including the preprocedure imaging, procedure itself and recovery, including overnight hospitalization. Patient has a very good understanding of the risks and benefits of the procedure. However, the patient continues to say that she may be interested in getting pregnant in the future. I explained to the patient multiple times that I would NOT recommend this procedure if she has any plans of getting pregnant. According to the patient, her gynecologist has even suggested that she should not get pregnant because of her fibroids. If the patient decides that she does not want to get pregnant in the future, I think she would be a candidate for the uterine artery embolization procedure. She would need a MRI to further evaluate her fibroid disease. At this time, the patient would like to proceed with MRI to see if she is a candidate for the uterine artery embolization procedure. We will  schedule the patient for a pelvic MRI. In the meantime, I've told the patient that she needs to seriously consider whether or not she would ever want to try to get pregnant in the future. Again, if the patient has any intentions of getting pregnant I would not recommend uterine artery ablation procedure. We will contact the patient following the pelvic MRI to discuss the results.   Thank you for this interesting consult.  I greatly enjoyed meeting Heidi Barrett and look forward to participating in their care.  A copy of this report was sent to the requesting provider on this date.  Electronically Signed: Carylon Perches 05/28/2016, 10:52 AM   I spent a total of  40 Minutes   in face to face in clinical consultation, greater than 50% of which was counseling/coordinating care for uterine fibroids.

## 2016-05-30 ENCOUNTER — Telehealth: Payer: Self-pay

## 2016-05-30 NOTE — Telephone Encounter (Addendum)
Per Dr. Ihor Dow, pt endo bx is normal.  Pt notified.

## 2016-06-05 ENCOUNTER — Other Ambulatory Visit: Payer: No Typology Code available for payment source

## 2016-06-19 ENCOUNTER — Encounter (HOSPITAL_COMMUNITY): Payer: Self-pay | Admitting: *Deleted

## 2016-06-19 DIAGNOSIS — I10 Essential (primary) hypertension: Secondary | ICD-10-CM | POA: Insufficient documentation

## 2016-06-19 DIAGNOSIS — Z79899 Other long term (current) drug therapy: Secondary | ICD-10-CM | POA: Insufficient documentation

## 2016-06-19 DIAGNOSIS — E038 Other specified hypothyroidism: Secondary | ICD-10-CM | POA: Insufficient documentation

## 2016-06-19 DIAGNOSIS — R0789 Other chest pain: Secondary | ICD-10-CM | POA: Insufficient documentation

## 2016-06-19 NOTE — ED Triage Notes (Signed)
Patient presents with c/o SOB HTN.  States thinks it may be like a panic attack like she had back in 2012

## 2016-06-20 ENCOUNTER — Emergency Department (HOSPITAL_COMMUNITY)
Admission: EM | Admit: 2016-06-20 | Discharge: 2016-06-20 | Disposition: A | Discharge status: Home / Self Care | Payer: Self-pay | Attending: Emergency Medicine | Admitting: Emergency Medicine

## 2016-06-20 ENCOUNTER — Emergency Department (HOSPITAL_COMMUNITY): Payer: Self-pay

## 2016-06-20 DIAGNOSIS — R079 Chest pain, unspecified: Secondary | ICD-10-CM

## 2016-06-20 LAB — COMPREHENSIVE METABOLIC PANEL
ALBUMIN: 4.2 g/dL (ref 3.5–5.0)
ALT: 28 U/L (ref 14–54)
ANION GAP: 8 (ref 5–15)
AST: 21 U/L (ref 15–41)
Alkaline Phosphatase: 35 U/L — ABNORMAL LOW (ref 38–126)
BUN: 18 mg/dL (ref 6–20)
CALCIUM: 9.1 mg/dL (ref 8.9–10.3)
CHLORIDE: 105 mmol/L (ref 101–111)
CO2: 25 mmol/L (ref 22–32)
Creatinine, Ser: 0.98 mg/dL (ref 0.44–1.00)
GFR calc non Af Amer: >60 mL/min (ref >60)
GLUCOSE: 94 mg/dL (ref 65–99)
POTASSIUM: 3.5 mmol/L (ref 3.5–5.1)
SODIUM: 138 mmol/L (ref 135–145)
Total Bilirubin: 0.7 mg/dL (ref 0.3–1.2)
Total Protein: 7.5 g/dL (ref 6.5–8.1)

## 2016-06-20 LAB — CBC WITH DIFFERENTIAL/PLATELET
BASOS PCT: 0 %
Basophils Absolute: 0 10*3/uL (ref 0.0–0.1)
EOS ABS: 0.3 10*3/uL (ref 0.0–0.7)
EOS PCT: 7 %
HCT: 37 % (ref 36.0–46.0)
HEMOGLOBIN: 12.7 g/dL (ref 12.0–15.0)
LYMPHS ABS: 1.4 10*3/uL (ref 0.7–4.0)
Lymphocytes Relative: 30 %
MCH: 31 pg (ref 26.0–34.0)
MCHC: 34.3 g/dL (ref 30.0–36.0)
MCV: 90.2 fL (ref 78.0–100.0)
MONOS PCT: 6 %
Monocytes Absolute: 0.3 10*3/uL (ref 0.1–1.0)
NEUTROS PCT: 56 %
Neutro Abs: 2.6 10*3/uL (ref 1.7–7.7)
PLATELETS: 115 10*3/uL — AB (ref 150–400)
RBC: 4.1 MIL/uL (ref 3.87–5.11)
RDW: 12.6 % (ref 11.5–15.5)
WBC: 4.7 10*3/uL (ref 4.0–10.5)

## 2016-06-20 LAB — URINE MICROSCOPIC-ADD ON

## 2016-06-20 LAB — URINALYSIS, ROUTINE W REFLEX MICROSCOPIC
BILIRUBIN URINE: NEGATIVE
Glucose, UA: NEGATIVE mg/dL
KETONES UR: NEGATIVE mg/dL
LEUKOCYTES UA: NEGATIVE
NITRITE: NEGATIVE
PH: 6.5 (ref 5.0–8.0)
PROTEIN: NEGATIVE mg/dL
Specific Gravity, Urine: 1.006 (ref 1.005–1.030)

## 2016-06-20 LAB — I-STAT TROPONIN, ED: Troponin i, poc: 0 ng/mL (ref 0.00–0.08)

## 2016-06-20 LAB — D-DIMER, QUANTITATIVE (NOT AT ARMC): D DIMER QUANT: 0.41 ug{FEU}/mL (ref 0.00–0.50)

## 2016-06-20 LAB — I-STAT BETA HCG BLOOD, ED (MC, WL, AP ONLY)

## 2016-06-20 LAB — TROPONIN I: Troponin I: <0.03 ng/mL (ref <0.03)

## 2016-06-20 NOTE — ED Provider Notes (Signed)
[facility] DEPT Provider Note   CSN: [id number] Arrival date & time: 06/19/16  2334     History   Chief Complaint Chief Complaint  Patient presents with  . Shortness of Breath  . Hypertension    HPI Heidi Barrett is a 50 y.o. female.  Patient presents with episode of chest tightness and shortness of breath that onset while she was walking outside. States she is under a lot of stress taking care of her ill mother. She was walking for about 20 minutes before she felt tightness in the center of her chest that did not radiate as well as shortness of breath. She stopped walking and the symptoms resolved. Chest pain did not radiate. There is no nausea or vomiting. There is no diaphoresis. There is no cough or fever. There is no focal weakness, numbness or tingling. Pain is similar to when she had a panic attack several years ago. She was seen by cardiology at an outside hospital reports had an abnormal stress test but reassuring cardiac catheterization in 2012. Patient denies any history of DVT or PE. She is not on any or moans. No recent car trips or plane trips.   The history is provided by the patient.  Shortness of Breath  Pertinent negatives include no fever, no headaches, no chest pain, no vomiting and no abdominal pain.  Hypertension  Associated symptoms include shortness of breath. Pertinent negatives include no chest pain, no abdominal pain and no headaches.    Past Medical History:  Diagnosis Date  . Anemia   . Hypertension   . Thyroid disease     Patient Active Problem List   Diagnosis Date Noted  . Right lower quadrant abdominal pain 02/08/2016  . Eye muscle twitches 02/08/2016  . Fibroid, uterine 02/08/2016  . Piriformis syndrome of right side 01/31/2015  . Allergic reaction caused by a drug 01/27/2015  . Heel spur 01/16/2015  . Essential hypertension, benign 01/16/2015  . Benign essential HTN 05/31/2014  . Plantar fasciitis 05/31/2014  . Other specified  hypothyroidism 05/31/2014  . Intramural leiomyoma of uterus 05/31/2014  . Menorrhagia 01/04/2013  . Fibroids 01/04/2013    History reviewed. No pertinent surgical history.  OB History    Gravida Para Term Preterm AB Living   0             SAB TAB Ectopic Multiple Live Births                   Home Medications    Prior to Admission medications   Medication Sig Start Date End Date Taking? Authorizing Provider  amLODipine (NORVASC) 10 MG tablet Take 1 tablet (10 mg total) by mouth daily. For blood pressure. Needs office visit. 02/08/16   [name], MD  calcium carbonate (CALCIUM 600) 600 MG TABS tablet Take 1 tablet (600 mg total) by mouth daily with breakfast. 05/31/14   [name], MD  cholecalciferol (VITAMIN D) 1000 UNITS tablet Take 1 tablet (1,000 Units total) by mouth daily. 05/31/14   [name], MD  EPINEPHrine 0.3 mg/0.3 mL IJ SOAJ injection Inject 0.3 mLs (0.3 mg total) into the muscle once. Patient not taking: Reported on 05/28/2016 01/30/15   [name], MD  ibuprofen (ADVIL,MOTRIN) 600 MG tablet Take 1 tablet (600 mg total) by mouth every 6 (six) hours as needed. 11/10/15   Hope Bunnie Pion, NP  imiquimod (ALDARA) 5 % cream Apply topically 3 (three) times a week. Patient not taking: Reported on  05/28/2016 01/04/13   Lavonia Drafts, MD  isosorbide mononitrate (IMDUR) 30 MG 24 hr tablet Take 1 tablet (30 mg total) by mouth daily. Needs office visit 02/08/16   [name], MD  levothyroxine (SYNTHROID, LEVOTHROID) 150 MCG tablet Take 1 tablet (150 mcg total) by mouth daily before breakfast. 02/08/16   [name], MD  Multiple Vitamin (MULTIVITAMIN WITH MINERALS) TABS tablet Take 1 tablet by mouth daily. 05/31/14   [name], MD    Family History Family History  Problem Relation Age of Onset  . Heart disease Mother   . Stroke Mother   . Cancer Father     prostate    Social History Social History  Substance  Use Topics  . Smoking status: Never Smoker  . Smokeless tobacco: Never Used  . Alcohol use No     Allergies   Hctz [hydrochlorothiazide]; Hydrocodone; Percocet [oxycodone-acetaminophen]; and Tramadol   Review of Systems Review of Systems  Constitutional: Negative for activity change, appetite change and fever.  HENT: Negative for congestion.   Eyes: Negative for visual disturbance.  Respiratory: Positive for chest tightness and shortness of breath.   Cardiovascular: Negative for chest pain and palpitations.  Gastrointestinal: Negative for abdominal pain, nausea and vomiting.  Genitourinary: Negative for dysuria, hematuria, vaginal bleeding and vaginal discharge.  Musculoskeletal: Negative for arthralgias and myalgias.  Neurological: Negative for dizziness, weakness and headaches.   A complete 10 system review of systems was obtained and all systems are negative except as noted in the HPI and PMH.    Physical Exam Updated Vital Signs BP 125/83   Pulse 65   Temp 97.8 F (36.6 C) (Oral)   Resp 19   Ht 5\' 8"  (1.727 m)   Wt 189 lb (85.7 kg)   LMP 05/14/2016   SpO2 100%   BMI 28.74 kg/m   Physical Exam  Constitutional: She is oriented to person, place, and time. She appears well-developed and well-nourished. No distress.  HENT:  Head: Normocephalic and atraumatic.  Mouth/Throat: Oropharynx is clear and moist. No oropharyngeal exudate.  Eyes: Conjunctivae and EOM are normal. Pupils are equal, round, and reactive to light.  Neck: Normal range of motion. Neck supple.  No meningismus.  Cardiovascular: Normal rate, regular rhythm, normal heart sounds and intact distal pulses.   No murmur heard. Pulmonary/Chest: Effort normal and breath sounds normal. No respiratory distress. She has no wheezes. She exhibits no tenderness.  Abdominal: Soft. There is no tenderness. There is no rebound and no guarding.  Musculoskeletal: Normal range of motion. She exhibits no edema or  tenderness.  Neurological: She is alert and oriented to person, place, and time. No cranial nerve deficit. She exhibits normal muscle tone. Coordination normal.  No ataxia on finger to nose bilaterally. No pronator drift. 5/5 strength throughout. CN 2-12 intact.Equal grip strength. Sensation intact.   Skin: Skin is warm.  Psychiatric: She has a normal mood and affect. Her behavior is normal.  Nursing note and vitals reviewed.    ED Treatments / Results  Labs (all labs ordered are listed, but only abnormal results are displayed) Labs Reviewed  CBC WITH DIFFERENTIAL/PLATELET - Abnormal; Notable for the following:       Result Value   Platelets 115 (*)    All other components within normal limits  COMPREHENSIVE METABOLIC PANEL - Abnormal; Notable for the following:    Alkaline Phosphatase 35 (*)    All other components within normal limits  URINALYSIS, ROUTINE W REFLEX MICROSCOPIC (  NOT AT [facility]) - Abnormal; Notable for the following:    Hgb urine dipstick TRACE (*)    All other components within normal limits  URINE MICROSCOPIC-ADD ON - Abnormal; Notable for the following:    Squamous Epithelial / LPF 0-5 (*)    Bacteria, UA RARE (*)    All other components within normal limits  TROPONIN I  D-DIMER, QUANTITATIVE (NOT AT [facility])  I-STAT TROPOININ, ED  I-STAT BETA HCG BLOOD, ED ([facility], [facility], [facility] ONLY)    EKG  EKG Interpretation  Date/Time:  Wednesday June 19 2016 23:42:45 EST Ventricular Rate:  93 PR Interval:  142 QRS Duration: 96 QT Interval:  362 QTC Calculation: 450 R Axis:   20 Text Interpretation:  Normal sinus rhythm Normal ECG No significant change was found Confirmed by Wyvonnia Dusky  MD, Esly Selvage [phone]) on 06/20/2016 4:46:05 AM       Radiology Dg Chest 2 View  Result Date: 06/20/2016 CLINICAL DATA:  Initial evaluation for acute shortness of breath, chest tightness. EXAM: CHEST  2 VIEW COMPARISON:  None. FINDINGS: The heart size and mediastinal contours are within normal  limits. Both lungs are clear. The visualized skeletal structures are unremarkable. IMPRESSION: No active cardiopulmonary disease. Electronically Signed   By: Jeannine Boga M.D.   On: 06/20/2016 05:38    Procedures Procedures (including critical care time)  Medications Ordered in ED Medications - No data to display   Initial Impression / Assessment and Plan / ED Course  I have reviewed the triage vital signs and the nursing notes.  Pertinent labs & imaging results that were available during my care of the patient were reviewed by me and considered in my medical decision making (see chart for details).  Clinical Course   Episode of chest pain and shortness of breath, now resolved.  Patient feels similar to previous panic attack.  Negative cardiac cath in 2012.  EKG unchanged.  Chest pain free now. Troponin negative. Heart score 2.   Patient feels improved. Troponin is negative 2. D-dimer is negative. Low suspicion for ACS or PE. Discussed with patient she would benefit from repeat stress test. she will like to see the cardiologist in [place]. Contact information is given. Return precautions discussed. Patient feels well. She admits she is under a lot of stress at home. Heart score is 2 troponins are negative 2 without acute EKG changes.  Final Clinical Impressions(s) / ED Diagnoses   Final diagnoses:  Chest pain, unspecified type    New Prescriptions New Prescriptions   No medications on file     Ezequiel Essex, MD 06/20/16 [phone]

## 2016-06-20 NOTE — Discharge Instructions (Signed)
There is no evidence of heart attack or blood clot in the lung. Follow-up with the cardiologist for a stress test. Return to the ED if he develop worsening chest pain, shortness of breath or any other concerns.

## 2016-06-20 NOTE — ED Notes (Signed)
Pt states that she feels this is from an anxiety attack. States she has been under a lot of stress lately

## 2016-06-25 ENCOUNTER — Encounter: Payer: Self-pay | Admitting: Diagnostic Radiology

## 2016-06-25 ENCOUNTER — Ambulatory Visit (INDEPENDENT_AMBULATORY_CARE_PROVIDER_SITE_OTHER): Payer: Self-pay | Admitting: Obstetrics & Gynecology

## 2016-06-25 ENCOUNTER — Ambulatory Visit: Payer: No Typology Code available for payment source | Attending: Internal Medicine

## 2016-06-25 VITALS — BP 128/93 | HR 82 | Ht 68.0 in | Wt 192.0 lb

## 2016-06-25 DIAGNOSIS — N939 Abnormal uterine and vaginal bleeding, unspecified: Secondary | ICD-10-CM

## 2016-06-25 DIAGNOSIS — D259 Leiomyoma of uterus, unspecified: Secondary | ICD-10-CM

## 2016-06-25 NOTE — Progress Notes (Signed)
History:  50 y.o. G0P0 here today for f/u of AUB. Pt met with the Int Rad but, had to cancel the MRI due to illness in her mother.  She is her mothers primary caretaker. She has since been in the ER with severe range blood pressures and she has had some chest pressure.  She has been scheduled with cardiology for a exercise stress test.  Pt reports that her bleeding is lighter the past cycle. She wants to proceed with the [place].      The following portions of the patient's history were reviewed and updated as appropriate: allergies, current medications, past family history, past medical history, past social history, past surgical history and problem list.  Review of Systems:  Pertinent items are noted in HPI.   Objective:  Physical Exam Blood pressure (!) 128/93, pulse 82, height 5' 8"  (1.727 m), weight 192 lb (87.1 kg), last menstrual period 06/21/2016. BP (!) 128/93   Pulse 82   Ht 5' 8"  (1.727 m)   Wt 192 lb (87.1 kg)   LMP 06/21/2016 (Exact Date)   BMI 29.19 kg/m  CONSTITUTIONAL: Well-developed, well-nourished female in no acute distress.  HENT:  Normocephalic, atraumatic EYES: Conjunctivae and EOM are normal. No scleral icterus.  NECK: Normal range of motion SKIN: Skin is warm and dry. No rash noted. Not diaphoretic.No pallor. [place]: Alert and oriented to person, place, and time. Normal coordination.   Labs and Imaging CBC    Component Value Date/Time   WBC 4.7 06/19/2016 2347   RBC 4.10 06/19/2016 2347   HGB 12.7 06/19/2016 2347   HCT 37.0 06/19/2016 2347   PLT 115 (L) 06/19/2016 2347   MCV 90.2 06/19/2016 2347   MCH 31.0 06/19/2016 2347   MCHC 34.3 06/19/2016 2347   RDW 12.6 06/19/2016 2347   LYMPHSABS 1.4 06/19/2016 2347   MONOABS 0.3 06/19/2016 2347   EOSABS 0.3 06/19/2016 2347   BASOSABS 0.0 06/19/2016 2347     Assessment & Plan:  AUB and uterine fibroids.  Pt want to proceed with the [place] but, I recommend that she get her exercise stress test first. Pt will  get her MRI rescheduled after her exercise stress test and then she will call the int rad for her [place]. I have asked the pt to f/u with me after her [place] or sooner prn.  Total face-to-face time with patient was 15 min.  Greater than 50% was spent in counseling and coordination of care with the patient. We discussed AUB and her current clinical condition as well as her concerns for her health in the care of her mother.     Ottie Neglia L. Ihor Dow, M.D., [facility] .

## 2016-06-25 NOTE — Patient Instructions (Signed)
Uterine Artery Embolization for Fibroids Uterine artery embolization is a nonsurgical treatment to shrink fibroids. A thin plastic tube (catheter) is used to inject material that blocks off the blood supply to the fibroid, which causes the fibroid to shrink. LET [facility] CARE PROVIDER KNOW ABOUT:  Any allergies you have.  All medicines you are taking, including vitamins, herbs, eye drops, creams, and over-the-counter medicines.  Previous problems you or members of your family have had with the use of anesthetics.  Any blood disorders you have.  Previous surgeries you have had.  Medical conditions you have. RISKS AND COMPLICATIONS  Injury to the uterus from decreased blood supply  Infection.  Blood infection (septicemia).  Lack of menstrual periods (amenorrhea).  Death of tissue cells (necrosis) around your bladder or vulva.  Development of a hole between organs or from an organ to the surface of your skin (fistula).  Blood clot in the legs (deep vein thrombosis) or lung (pulmonary embolus). BEFORE THE PROCEDURE  Ask your health care provider about changing or stopping your regular medicines.   Do not take aspirin or blood thinners (anticoagulants) for 1 week before the surgery or as directed by your health care provider.  Do not eat or drink anything for 8 hours before the surgery or as directed by your health care provider.   Empty your bladder before the procedure begins. PROCEDURE   An IV tube will be placed into one of your veins. This will be used to give you a sedative and pain medication (conscious sedation).  You will be given a medicine that numbs the area (local anesthetic).  A small cut will be made in your groin. A catheter is then inserted into the main artery of your leg.  The catheter will be guided through the artery to your uterus. A series of images will be taken while dye is injected through the catheter in your groin. X-rays are taken at the  same time. This is done to provide a road map of the blood supply to your uterus and fibroids.  Tiny plastic spheres, about the size of sand grains, will be injected through the catheter. Metal coils may be used to help block the artery. The particles will lodge in tiny branches of the uterine artery that supplies blood to the fibroids.  The procedure is repeated on the artery that supplies the other side of the uterus.  The catheter is then removed and pressure is held to stop any bleeding. No stitches are needed.  A dressing is then placed over the cut (incision). AFTER THE PROCEDURE  You will be taken to a recovery area where your progress will be monitored until you are awake, stable, and taking fluids well. If there are no other problems, you will then be moved to a regular hospital room.  You will be observed overnight in the hospital.  You will have cramping that should be controlled with pain medication. This information is not intended to replace advice given to you by your health care provider. Make sure you discuss any questions you have with your health care provider. Document Released: 10/07/2005 Document Revised: 05/12/2013 Document Reviewed: 02/04/2013 Elsevier Interactive Patient Education  2017 [employer].

## 2016-07-04 ENCOUNTER — Encounter: Payer: Self-pay | Admitting: Internal Medicine

## 2016-07-04 ENCOUNTER — Ambulatory Visit: Payer: Self-pay | Attending: Internal Medicine | Admitting: Internal Medicine

## 2016-07-04 VITALS — BP 126/79 | HR 82 | Temp 98.7°F | Ht 68.0 in | Wt 189.4 lb

## 2016-07-04 DIAGNOSIS — D259 Leiomyoma of uterus, unspecified: Secondary | ICD-10-CM | POA: Insufficient documentation

## 2016-07-04 DIAGNOSIS — I10 Essential (primary) hypertension: Secondary | ICD-10-CM | POA: Insufficient documentation

## 2016-07-04 DIAGNOSIS — D649 Anemia, unspecified: Secondary | ICD-10-CM | POA: Insufficient documentation

## 2016-07-04 DIAGNOSIS — E039 Hypothyroidism, unspecified: Secondary | ICD-10-CM | POA: Insufficient documentation

## 2016-07-04 DIAGNOSIS — Z79899 Other long term (current) drug therapy: Secondary | ICD-10-CM | POA: Insufficient documentation

## 2016-07-04 DIAGNOSIS — F411 Generalized anxiety disorder: Secondary | ICD-10-CM | POA: Insufficient documentation

## 2016-07-04 DIAGNOSIS — N938 Other specified abnormal uterine and vaginal bleeding: Secondary | ICD-10-CM | POA: Insufficient documentation

## 2016-07-04 DIAGNOSIS — Z885 Allergy status to narcotic agent status: Secondary | ICD-10-CM | POA: Insufficient documentation

## 2016-07-04 NOTE — Progress Notes (Signed)
Heidi Barrett, is a 50 y.o. female  [id number]  [id number]  DOB - 11-Nov-1965  Chief Complaint  Patient presents with  . Follow-up      Subjective:   Heidi Barrett is a 50 y.o. female with history of hypertension, hypothyroidism and chronic anemia from DUB secondary to multiple uterine fibroids here today for an ED Visit follow up. Patient presents with episode of chest tightness and shortness of breath that onset while she was walking outside. She was evaluated, MI was ruled out. Symptom was attributed to panic attack/Anxiety Disorder. Patient says she is under a lot of stress taking care of her ill mother. She has no fever, no urinary symptom, she occasionally have associated nausea but no vomiting, no diarrhea although her stool is occasionally mucoid and watery, no abdominal distension. No new complaint today. Patient has No headache, No chest pain, No abdominal pain - No Nausea, No new weakness tingling or numbness, No Cough - SOB.  Problem  Generalized Anxiety Disorder   ALLERGIES: Allergies  Allergen Reactions  . Hctz [Hydrochlorothiazide] Shortness Of Breath and Other (See Comments)    wheezing  . Hydrocodone Nausea And Vomiting    dizzines  . Percocet [Oxycodone-Acetaminophen]   . Tramadol    PAST MEDICAL HISTORY: Past Medical History:  Diagnosis Date  . Anemia   . Hypertension   . Thyroid disease    MEDICATIONS AT HOME: Prior to Admission medications   Medication Sig Start Date End Date Taking? Authorizing Provider  amLODipine (NORVASC) 10 MG tablet Take 1 tablet (10 mg total) by mouth daily. For blood pressure. Needs office visit. 02/08/16  Yes [name], MD  calcium carbonate (CALCIUM 600) 600 MG TABS tablet Take 1 tablet (600 mg total) by mouth daily with breakfast. 05/31/14  Yes [name], MD  cholecalciferol (VITAMIN D) 1000 UNITS tablet Take 1 tablet (1,000 Units total) by mouth daily. 05/31/14  Yes [name], MD    ibuprofen (ADVIL,MOTRIN) 600 MG tablet Take 1 tablet (600 mg total) by mouth every 6 (six) hours as needed. 11/10/15  Yes Hope Bunnie Pion, NP  isosorbide mononitrate (IMDUR) 30 MG 24 hr tablet Take 1 tablet (30 mg total) by mouth daily. Needs office visit 02/08/16  Yes [name], MD  levothyroxine (SYNTHROID, LEVOTHROID) 150 MCG tablet Take 1 tablet (150 mcg total) by mouth daily before breakfast. 02/08/16  Yes [name], MD  Multiple Vitamin (MULTIVITAMIN WITH MINERALS) TABS tablet Take 1 tablet by mouth daily. 05/31/14  Yes [name], MD    Objective:   Vitals:   07/04/16 1239  BP: 126/79  Pulse: 82  Temp: 98.7 F (37.1 C)  TempSrc: Oral  Weight: 189 lb 6.4 oz (85.9 kg)  Height: 5\' 8"  (1.727 m)   Exam General appearance : Awake, alert, not in any distress. Speech Clear. Not toxic looking HEENT: Atraumatic and Normocephalic, pupils equally reactive to light and accomodation Neck: Supple, no JVD. No cervical lymphadenopathy.  Chest: Good air entry bilaterally, no added sounds  CVS: S1 S2 regular, no murmurs.  Abdomen: Bowel sounds present, Non tender and not distended with no gaurding, rigidity or rebound. Extremities: B/L Lower Ext shows no edema, both legs are warm to touch Neurology: Awake alert, and oriented X 3, CN II-XII intact, Non focal Skin: No Rash  Data Review Lab Results  Component Value Date   HGBA1C 4.5 05/31/2014   Assessment & Plan   1. Essential hypertension  We have  discussed target BP range and blood pressure goal. I have advised patient to check BP regularly and to call us back or report to clinic if the numbers are consistently higher than 140/90. We discussed the importance of compliance with medical therapy and DASH diet recommended, consequences of uncontrolled hypertension discussed.  - continue current BP medications  2. Generalized anxiety disorder  - Improved - Patient counseled extensively - Patient denied suicidal  ideation or thoughts  Patient have been counseled extensively about nutrition and exercise. Other issues discussed during this visit include: low cholesterol diet, weight control and daily exercise, foot care, annual eye examinations at Ophthalmology, importance of adherence with medications and regular follow-up. We also discussed long term complications of uncontrolled diabetes and hypertension.   Return in about 3 months (around 10/02/2016).  The patient was given clear instructions to go to ER or return to medical center if symptoms don't improve, worsen or new problems develop. The patient verbalized understanding. The patient was told to call to get lab results if they haven't heard anything in the next week.   This note has been created with Surveyor, quantity. Any transcriptional errors are unintentional.    Angelica Chessman, MD, [place], [place], [place], [place] and [place], [place]   07/04/2016, 12:59 PM

## 2016-07-04 NOTE — Patient Instructions (Signed)
Hypertension Hypertension, commonly called high blood pressure, is when the force of blood pumping through your arteries is too strong. Your arteries are the blood vessels that carry blood from your heart throughout your body. A blood pressure reading consists of a higher number over a lower number, such as 110/72. The higher number (systolic) is the pressure inside your arteries when your heart pumps. The lower number (diastolic) is the pressure inside your arteries when your heart relaxes. Ideally you want your blood pressure below 120/80. Hypertension forces your heart to work harder to pump blood. Your arteries may become narrow or stiff. Having untreated or uncontrolled hypertension can cause heart attack, stroke, kidney disease, and other problems. What increases the risk? Some risk factors for high blood pressure are controllable. Others are not. Risk factors you cannot control include:  Race. You may be at higher risk if you are African American.  Age. Risk increases with age.  Gender. Men are at higher risk than women before age 45 years. After age 65, women are at higher risk than men. Risk factors you can control include:  Not getting enough exercise or physical activity.  Being overweight.  Getting too much fat, sugar, calories, or salt in your diet.  Drinking too much alcohol. What are the signs or symptoms? Hypertension does not usually cause signs or symptoms. Extremely high blood pressure (hypertensive crisis) may cause headache, anxiety, shortness of breath, and nosebleed. How is this diagnosed? To check if you have hypertension, your health care provider will measure your blood pressure while you are seated, with your arm held at the level of your heart. It should be measured at least twice using the same arm. Certain conditions can cause a difference in blood pressure between your right and left arms. A blood pressure reading that is higher than normal on one occasion does  not mean that you need treatment. If it is not clear whether you have high blood pressure, you may be asked to return on a different day to have your blood pressure checked again. Or, you may be asked to monitor your blood pressure at home for 1 or more weeks. How is this treated? Treating high blood pressure includes making lifestyle changes and possibly taking medicine. Living a healthy lifestyle can help lower high blood pressure. You may need to change some of your habits. Lifestyle changes may include:  Following the DASH diet. This diet is high in fruits, vegetables, and whole grains. It is low in salt, red meat, and added sugars.  Keep your sodium intake below 2,300 mg per day.  Getting at least 30-45 minutes of aerobic exercise at least 4 times per week.  Losing weight if necessary.  Not smoking.  Limiting alcoholic beverages.  Learning ways to reduce stress. Your health care provider may prescribe medicine if lifestyle changes are not enough to get your blood pressure under control, and if one of the following is true:  You are 18-59 years of age and your systolic blood pressure is above 140.  You are 60 years of age or older, and your systolic blood pressure is above 150.  Your diastolic blood pressure is above 90.  You have diabetes, and your systolic blood pressure is over 140 or your diastolic blood pressure is over 90.  You have kidney disease and your blood pressure is above 140/90.  You have heart disease and your blood pressure is above 140/90. Your personal target blood pressure may vary depending on your medical   conditions, your age, and other factors. Follow these instructions at home:  Have your blood pressure rechecked as directed by your health care provider.  Take medicines only as directed by your health care provider. Follow the directions carefully. Blood pressure medicines must be taken as prescribed. The medicine does not work as well when you skip  doses. Skipping doses also puts you at risk for problems.  Do not smoke.  Monitor your blood pressure at home as directed by your health care provider. Contact a health care provider if:  You think you are having a reaction to medicines taken.  You have recurrent headaches or feel dizzy.  You have swelling in your ankles.  You have trouble with your vision. Get help right away if:  You develop a severe headache or confusion.  You have unusual weakness, numbness, or feel faint.  You have severe chest or abdominal pain.  You vomit repeatedly.  You have trouble breathing. This information is not intended to replace advice given to you by your health care provider. Make sure you discuss any questions you have with your health care provider. Document Released: 07/22/2005 Document Revised: 12/28/2015 Document Reviewed: 05/14/2013 Elsevier Interactive Patient Education  2017 Elsevier Inc. Generalized Anxiety Disorder Generalized anxiety disorder (GAD) is a mental disorder. It interferes with life functions, including relationships, work, and school. GAD is different from normal anxiety, which everyone experiences at some point in their lives in response to specific life events and activities. Normal anxiety actually helps [place] prepare for and get through these life events and activities. Normal anxiety goes away after the event or activity is over.  GAD causes anxiety that is not necessarily related to specific events or activities. It also causes excess anxiety in proportion to specific events or activities. The anxiety associated with GAD is also difficult to control. GAD can vary from mild to severe. People with severe GAD can have intense waves of anxiety with physical symptoms (panic attacks).  SYMPTOMS The anxiety and worry associated with GAD are difficult to control. This anxiety and worry are related to many life events and activities and also occur more days than not for 6 months  or longer. People with GAD also have three or more of the following symptoms (one or more in children):  Restlessness.   Fatigue.  Difficulty concentrating.   Irritability.  Muscle tension.  Difficulty sleeping or unsatisfying sleep. DIAGNOSIS GAD is diagnosed through an assessment by your health care provider. Your health care provider will ask you questions aboutyour mood,physical symptoms, and events in your life. Your health care provider may ask you about your medical history and use of alcohol or drugs, including prescription medicines. Your health care provider may also do a physical exam and blood tests. Certain medical conditions and the use of certain substances can cause symptoms similar to those associated with GAD. Your health care provider may refer you to a mental health specialist for further evaluation. TREATMENT The following therapies are usually used to treat GAD:   Medication. Antidepressant medication usually is prescribed for long-term daily control. Antianxiety medicines may be added in severe cases, especially when panic attacks occur.   Talk therapy (psychotherapy). Certain types of talk therapy can be helpful in treating GAD by providing support, education, and guidance. A form of talk therapy called cognitive behavioral therapy can teach you healthy ways to think about and react to daily life events and activities.  Stress managementtechniques. These include yoga, meditation, and exercise and can  be very helpful when they are practiced regularly. A mental health specialist can help determine which treatment is best for you. Some people see improvement with one therapy. However, other people require a combination of therapies. This information is not intended to replace advice given to you by your health care provider. Make sure you discuss any questions you have with your health care provider. Document Released: 11/16/2012 Document Revised: 08/12/2014  Document Reviewed: 11/16/2012 Elsevier Interactive Patient Education  2017 [employer].

## 2016-07-04 NOTE — Progress Notes (Signed)
Patient is here for ED SOB FU  Patient denies pain at this time.  Patient denies SOB at this time.  Patient states the chest tightness is much lighter and intermittent.

## 2016-07-11 ENCOUNTER — Ambulatory Visit (INDEPENDENT_AMBULATORY_CARE_PROVIDER_SITE_OTHER): Payer: No Typology Code available for payment source | Admitting: Cardiovascular Disease

## 2016-07-11 ENCOUNTER — Encounter: Payer: Self-pay | Admitting: Cardiovascular Disease

## 2016-07-11 VITALS — BP 130/100 | HR 74 | Ht 68.0 in | Wt 189.8 lb

## 2016-07-11 DIAGNOSIS — R0789 Other chest pain: Secondary | ICD-10-CM

## 2016-07-11 DIAGNOSIS — I10 Essential (primary) hypertension: Secondary | ICD-10-CM

## 2016-07-11 NOTE — Patient Instructions (Signed)
Medication Instructions:  STOP Imdur   Labwork: None Ordered   Testing/Procedures: Your physician has requested that you have an exercise tolerance test. For further information please visit [email or web address]. Please also follow instruction sheet, as given.   Follow-Up: Your physician recommends that you schedule a follow-up appointment in: as needed with Dr. Acie Fredrickson.    If you need a refill on your cardiac medications before your next appointment, please call your pharmacy.   Thank you for choosing [facility]! Christen Bame, RN [phone]

## 2016-07-11 NOTE — Progress Notes (Signed)
Cardiology Office Note   Date:  07/11/2016   ID:  Heidi Barrett, DOB [birth year], MRN [id number]  PCP:  Heidi Chessman, MD  Cardiologist:   Heidi Moores, MD    Problem list 1. Chest pain 2. Essential hypertension 3. Hypothyroidism 4. History of anemia 5. Hx of morbid obesity - previous weight was 272 lbs.   Chief Complaint  Patient presents with  . Chest Pain      Heidi Barrett is a 50 y.o. female who presents for Follow-up of chest pain. She was recently seen in the emergency room for episodes of chest pain. She had a negative d-dimer. She had 2 negative troponin levels.  Was referred her from the ER for stress testing  On 06/20/16 - was out walking .  Had severe dyspnea, choking sensation, then developed chest pain .    Called 911,   BP was 200/102 Felt like her throat was closing .,  Could not breath  Lasted for several hours.    Has not occurred since that time .  In 2012 she lived in [place] .   Had lots of stress at that time.  Had a cath by Dr. Claudina Barrett - was normal ,   Had an echocardiogram -  Had a slight leakage but no significant abn.   Tries to walk.   30-40 minutes several times a week .  Has walked since the ER visit and has felt well.   BP is typically well controled.  Eats a low salt diet  Is under lots of stress. Is on a leave of absence from her job to take care of her mother.   previously worked as a Hydrologist MD - Transport planner.      Past Medical History:  Diagnosis Date  . Anemia   . Hypertension   . Thyroid disease     Past Surgical History:  Procedure Laterality Date  . IR GENERIC HISTORICAL  05/28/2016   IR RADIOLOGIST EVAL & MGMT 05/28/2016 Heidi Daft, MD [facility] INTERV RAD     Current Outpatient Prescriptions  Medication Sig Dispense Refill  . amLODipine (NORVASC) 10 MG tablet Take 1 tablet (10 mg total) by mouth daily. For blood pressure. Needs office visit. 90 tablet 3  . calcium carbonate (CALCIUM  600) 600 MG TABS tablet Take 1 tablet (600 mg total) by mouth daily with breakfast. 60 tablet 3  . cholecalciferol (VITAMIN D) 1000 UNITS tablet Take 1 tablet (1,000 Units total) by mouth daily. 90 tablet 3  . ibuprofen (ADVIL,MOTRIN) 600 MG tablet Take 1 tablet (600 mg total) by mouth every 6 (six) hours as needed. 30 tablet 0  . isosorbide mononitrate (IMDUR) 30 MG 24 hr tablet Take 1 tablet (30 mg total) by mouth daily. Needs office visit 90 tablet 3  . levothyroxine (SYNTHROID, LEVOTHROID) 150 MCG tablet Take 1 tablet (150 mcg total) by mouth daily before breakfast. 90 tablet 3  . Multiple Vitamin (MULTIVITAMIN WITH MINERALS) TABS tablet Take 1 tablet by mouth daily. 90 tablet 3   No current facility-administered medications for this visit.     Allergies:   Hctz [hydrochlorothiazide]; Hydrocodone; Percocet [oxycodone-acetaminophen]; and Tramadol    Social History:  The patient  reports that she has never smoked. She has never used smokeless tobacco. She reports that she does not drink alcohol or use drugs.   Family History:  The patient's family history includes Cancer in her father; Heart disease in her mother; Stroke in her  mother.    ROS:  Please see the history of present illness.    Review of Systems: Constitutional:  denies fever, chills, diaphoresis, appetite change and fatigue.  HEENT: denies photophobia, eye pain, redness, hearing loss, ear pain, congestion, sore throat, rhinorrhea, sneezing, neck pain, neck stiffness and tinnitus.  Respiratory: denies SOB, DOE, cough, chest tightness, and wheezing.  Cardiovascular: denies chest pain, palpitations and leg swelling.  Gastrointestinal: denies nausea, vomiting, abdominal pain, diarrhea, constipation, blood in stool.  Genitourinary: denies dysuria, urgency, frequency, hematuria, flank pain and difficulty urinating.  Musculoskeletal: denies  myalgias, back pain, joint swelling, arthralgias and gait problem.   Skin: denies pallor,  rash and wound.  Neurological: denies dizziness, seizures, syncope, weakness, light-headedness, numbness and headaches.   Hematological: denies adenopathy, easy bruising, personal or family bleeding history.  Psychiatric/ Behavioral: denies suicidal ideation, mood changes, confusion, nervousness, sleep disturbance and agitation.       All other systems are reviewed and negative.    PHYSICAL EXAM: VS:  BP (!) 130/100 (BP Location: Left Arm, Patient Position: Sitting, Cuff Size: Normal)   Pulse 74   Ht 5\' 8"  (1.727 m)   Wt 189 lb 12.8 oz (86.1 kg)   LMP 06/21/2016 (Exact Date)   SpO2 98%   BMI 28.86 kg/m  , BMI Body mass index is 28.86 kg/m. GEN: Well nourished, well developed, in no acute distress  HEENT: normal  Neck: no JVD, carotid bruits, or masses Cardiac: RRR; no murmurs, rubs, or gallops,no edema  Respiratory:  clear to auscultation bilaterally, normal work of breathing GI: soft, nontender, nondistended, + BS MS: no deformity or atrophy  Skin: warm and dry, no rash Neuro:  Strength and sensation are intact Psych: normal   EKG:  EKG is not ordered today. The ekg ordered 11/16 demonstrates NSR at 93.  No ST or T wave changes.    Recent Labs: 05/20/2016: TSH 3.36 06/19/2016: ALT 28; BUN 18; Creatinine, Ser 0.98; Hemoglobin 12.7; Platelets 115; Potassium 3.5; Sodium 138    Lipid Panel    Component Value Date/Time   CHOL 229 (H) 01/16/2015 1041   TRIG 44 01/16/2015 1041   HDL 88 01/16/2015 1041   CHOLHDL 2.6 01/16/2015 1041   VLDL 9 01/16/2015 1041   LDLCALC 132 (H) 01/16/2015 1041      Wt Readings from Last 3 Encounters:  07/11/16 189 lb 12.8 oz (86.1 kg)  07/04/16 189 lb 6.4 oz (85.9 kg)  06/25/16 192 lb (87.1 kg)      Other studies Reviewed: Additional studies/ records that were reviewed today include:. Review of the above records demonstrates:    ASSESSMENT AND PLAN:  1.  Chest pain :   Very atypical .    Troponin x 2 were negative. Had a  normal cardiac cath 5 years ago  Also had an echo - no signicant abn were found.  Have reassured her that he symptoms do not sound serious.  Advised her to exercise regularly   Will get a GXT.  Has been on isosorbide for the past 5 years. She's not had any significant angina. She had a normal heart cath in the past . We can discontinue the isosorbide.  Current medicines are reviewed at length with the patient today.  The patient does not have concerns regarding medicines.  Labs/ tests ordered today include:   GXT  No orders of the defined types were placed in this encounter.    Disposition:   FU with me as needed  Heidi Moores, MD  07/11/2016 9:11 AM    [facility] [place], [place],   [address] Phone: [phone]; Fax: [phone]

## 2016-07-12 DIAGNOSIS — R0789 Other chest pain: Secondary | ICD-10-CM | POA: Insufficient documentation

## 2016-07-17 ENCOUNTER — Encounter: Payer: Self-pay | Admitting: Cardiovascular Disease

## 2016-07-23 ENCOUNTER — Ambulatory Visit (INDEPENDENT_AMBULATORY_CARE_PROVIDER_SITE_OTHER): Payer: No Typology Code available for payment source

## 2016-07-23 DIAGNOSIS — R0789 Other chest pain: Secondary | ICD-10-CM

## 2016-07-23 LAB — EXERCISE TOLERANCE TEST
CSEPED: 10 min
CSEPHR: 93 %
Estimated workload: 11.7 METS
Exercise duration (sec): 0 s
MPHR: 160 {beats}/min
Peak HR: 160 {beats}/min
RPE: 19
Rest HR: 73 {beats}/min

## 2016-07-25 ENCOUNTER — Other Ambulatory Visit: Payer: Self-pay | Admitting: *Deleted

## 2016-07-25 DIAGNOSIS — E038 Other specified hypothyroidism: Secondary | ICD-10-CM

## 2016-07-25 MED ORDER — LEVOTHYROXINE SODIUM 150 MCG PO TABS: 150.0000 ug | ORAL_TABLET | Freq: Every day | ORAL | 3 refills | Status: DC

## 2016-07-25 NOTE — Telephone Encounter (Signed)
PRINTED FOR PASS PROGRAM 

## 2016-08-12 ENCOUNTER — Other Ambulatory Visit: Payer: Self-pay | Admitting: *Deleted

## 2016-08-12 DIAGNOSIS — E038 Other specified hypothyroidism: Secondary | ICD-10-CM

## 2016-08-12 MED ORDER — LEVOTHYROXINE SODIUM 150 MCG PO TABS: 150.0000 ug | ORAL_TABLET | Freq: Every day | ORAL | 3 refills | Status: DC

## 2016-08-12 NOTE — Telephone Encounter (Signed)
PRINTED FOR PASS PROGRAM 

## 2016-12-31 ENCOUNTER — Ambulatory Visit: Payer: Self-pay | Attending: Internal Medicine

## 2017-01-02 ENCOUNTER — Ambulatory Visit: Payer: Self-pay

## 2017-01-15 ENCOUNTER — Ambulatory Visit: Payer: Self-pay | Admitting: Internal Medicine

## 2017-01-20 ENCOUNTER — Other Ambulatory Visit: Payer: Self-pay | Admitting: Internal Medicine

## 2017-01-20 DIAGNOSIS — I10 Essential (primary) hypertension: Secondary | ICD-10-CM

## 2017-01-29 ENCOUNTER — Telehealth: Payer: Self-pay | Admitting: Obstetrics & Gynecology

## 2017-01-29 NOTE — Telephone Encounter (Signed)
Calling  regarding  Getting a referral to [facility]

## 2017-02-10 ENCOUNTER — Other Ambulatory Visit: Payer: Self-pay | Admitting: Internal Medicine

## 2017-02-10 DIAGNOSIS — I10 Essential (primary) hypertension: Secondary | ICD-10-CM

## 2017-02-19 ENCOUNTER — Other Ambulatory Visit (HOSPITAL_COMMUNITY): Payer: Self-pay | Admitting: Diagnostic Radiology

## 2017-02-19 DIAGNOSIS — D250 Submucous leiomyoma of uterus: Secondary | ICD-10-CM

## 2017-02-24 ENCOUNTER — Ambulatory Visit (HOSPITAL_COMMUNITY)
Admission: RE | Admit: 2017-02-24 | Discharge: 2017-02-24 | Disposition: A | Discharge status: Home / Self Care | Payer: Self-pay | Source: Ambulatory Visit | Attending: Diagnostic Radiology | Admitting: Diagnostic Radiology

## 2017-02-24 DIAGNOSIS — K4090 Unilateral inguinal hernia, without obstruction or gangrene, not specified as recurrent: Secondary | ICD-10-CM | POA: Insufficient documentation

## 2017-02-24 DIAGNOSIS — D250 Submucous leiomyoma of uterus: Secondary | ICD-10-CM | POA: Insufficient documentation

## 2017-02-24 LAB — POCT I-STAT CREATININE: CREATININE: 1 mg/dL (ref 0.44–1.00)

## 2017-02-24 MED ORDER — GADOBENATE DIMEGLUMINE 529 MG/ML IV SOLN
20.0000 mL | Freq: Once | INTRAVENOUS | Status: AC | PRN
  Administered 2017-02-24: 18 mL via INTRAVENOUS

## 2017-03-06 ENCOUNTER — Other Ambulatory Visit (HOSPITAL_COMMUNITY): Payer: Self-pay | Admitting: Diagnostic Radiology

## 2017-03-06 ENCOUNTER — Ambulatory Visit
Admission: RE | Admit: 2017-03-06 | Discharge: 2017-03-06 | Disposition: A | Discharge status: Home / Self Care | Payer: No Typology Code available for payment source | Source: Ambulatory Visit | Attending: Diagnostic Radiology | Admitting: Diagnostic Radiology

## 2017-03-06 DIAGNOSIS — D250 Submucous leiomyoma of uterus: Secondary | ICD-10-CM

## 2017-03-06 HISTORY — PX: IR RADIOLOGIST EVAL & MGMT: IMG5224

## 2017-03-06 NOTE — Consult Note (Signed)
Chief Complaint: Patient was seen in consultation today for  Chief Complaint  Patient presents with  . Follow-up    to review pre [place] MR   at the request of Montrice Montuori  Referring Physician(s): Harraway-Smith, Hoyle Sauer  History of Present Illness: Heidi Barrett is a 51 y.o. female with uterine fibroids and menorrhagia. I saw the patient on 05/28/2016 and we discussed the uterine artery embolization procedure. Patient decided to wait on pursuing this treatment and waited on getting a pelvic MRI. The menstrual bleeding was tolerable for most of the year. However, in July 2018 the patient had approximately 14 days of continuous menstrual bleeding. The bleeding was very irregular with days of heavy flow and days of minimal flow. The bleeding stopped for a couple weeks but it appears to be starting up again. Patient is very frustrated with this heavy bleeding. She has minimal cramping but does have urinary frequency. The heavy menstrual bleeding is lifestyle limiting. She is scared to leave the house at times and unable to attend church sometimes due to the heavy bleeding. She has many questions about whether or not she is perimenopausal. It sounds like she has family members that went into menopause around the same age. The patient has never had a baby and there was some desire to try to get pregnant when I saw her last time. At this time, she seems to be comfortable with the fact that she will probably never have a a baby and she understands that getting pregnant would be a contraindication after a uterine artery embolization procedure.  Past Medical History:  Diagnosis Date  . Anemia   . Hypertension   . Thyroid disease     Past Surgical History:  Procedure Laterality Date  . IR GENERIC HISTORICAL  05/28/2016   IR RADIOLOGIST EVAL & MGMT 05/28/2016 Markus Daft, MD [facility] INTERV RAD    Allergies: Hctz [hydrochlorothiazide]; Hydrocodone; Percocet [oxycodone-acetaminophen]; and  Tramadol  Medications: Prior to Admission medications   Medication Sig Start Date End Date Taking? Authorizing Provider  amLODipine (NORVASC) 10 MG tablet TAKE 1 TABLET BY MOUTH DAILY. FOR BLOOD PRESSURE. NEEDS OFFICE VISIT. 02/10/17  Yes [name], MD  calcium carbonate (CALCIUM 600) 600 MG TABS tablet Take 1 tablet (600 mg total) by mouth daily with breakfast. 05/31/14  Yes Jegede, Olugbemiga E, MD  cholecalciferol (VITAMIN D) 1000 UNITS tablet Take 1 tablet (1,000 Units total) by mouth daily. 05/31/14  Yes [name], MD  ibuprofen (ADVIL,MOTRIN) 600 MG tablet Take 1 tablet (600 mg total) by mouth every 6 (six) hours as needed. 11/10/15  Yes Neese, Durenda Guthrie M, NP  levothyroxine (SYNTHROID, LEVOTHROID) 150 MCG tablet Take 1 tablet (150 mcg total) by mouth daily before breakfast. 08/12/16  Yes [name], MD  Multiple Vitamin (MULTIVITAMIN WITH MINERALS) TABS tablet Take 1 tablet by mouth daily. 05/31/14  Yes [name], MD     Family History  Problem Relation Age of Onset  . Heart disease Mother   . Stroke Mother   . Cancer Father        prostate    Social History   Social History  . Marital status: Single    Spouse name: N/A  . Number of children: N/A  . Years of education: N/A   Social History Main Topics  . Smoking status: Never Smoker  . Smokeless tobacco: Never Used  . Alcohol use No  . Drug use: No  . Sexual activity: No  Other Topics Concern  . Not on file   Social History Narrative  . No narrative on file     Review of Systems  Genitourinary: Positive for frequency, pelvic pain and vaginal bleeding.    Vital Signs: BP (!) 132/91   Pulse 76   Temp 98.4 F (36.9 C) (Oral)   Resp 14   Ht 5\' 8"  (1.727 m)   Wt 188 lb (85.3 kg)   LMP 03/03/2017   SpO2 100%   BMI 28.59 kg/m   Physical Exam  Constitutional: She is oriented to person, place, and time. No distress.  Pulmonary/Chest: No respiratory distress.  Neurological:  She is alert and oriented to person, place, and time.       Imaging: Mr Pelvis W Wo Contrast  Result Date: 02/25/2017 CLINICAL DATA:  Uterine fibroids. Menorrhagia. Evaluation for uterine artery embolization. EXAM: MRI PELVIS WITHOUT AND WITH CONTRAST TECHNIQUE: Multiplanar multisequence MR imaging of the pelvis was performed both before and after administration of intravenous contrast. CONTRAST:  81mL MULTIHANCE GADOBENATE DIMEGLUMINE 529 MG/ML IV SOLN COMPARISON:  CT on 02/13/2016 FINDINGS: Urinary Tract: No bladder abnormality identified. Bowel:  Unremarkable visualized pelvic bowel loops. Vascular/Lymphatic: No pathologically enlarged lymph nodes or other significant abnormality. Reproductive: Uterus: Measures 14.6 x 12.2 x 13.1 cm (volume = 1220 cm^3). Innumerable fibroids are seen which involve the uterus diffusely. These are submucosal and intramural in location. These range in size from 1 cm to 7 cm. Intracavitary fibroids:  None. Pedunculated fibroids: None. Fibroid contrast enhancement: Nearly all fibroids show contrast enhancement, without significant degeneration/devascularization. Right ovary:  Appears normal.  No mass identified. Left ovary:  Appears normal.  No mass identified. Other: Small amount of free fluid in pelvic cul-de-sac. Tiny fat-containing right inguinal hernia. Musculoskeletal:  Unremarkable. IMPRESSION: Diffuse uterine involvement by innumerable submucosal and intramural fibroids measuring up to 7 cm. No intracavitary or pedunculated fibroids identified. Normal appearance of both ovaries.  No adnexal mass. Electronically Signed   By: Earle Gell M.D.   On: 02/25/2017 08:33    Labs:  CBC:  Recent Labs  05/20/16 1653 06/19/16 2347  WBC 3.9 4.7  HGB 12.7 12.7  HCT 37.3 37.0  PLT 129* 115*    COAGS: No results for input(s): INR, APTT in the last 8760 hours.  BMP:  Recent Labs  06/19/16 2347 02/24/17 1514  NA 138  --   K 3.5  --   CL 105  --   CO2 25  --    GLUCOSE 94  --   BUN 18  --   CALCIUM 9.1  --   CREATININE 0.98 1.00  GFRNONAA >60  --   GFRAA >60  --     LIVER FUNCTION TESTS:  Recent Labs  06/19/16 2347  BILITOT 0.7  AST 21  ALT 28  ALKPHOS 35*  PROT 7.5  ALBUMIN 4.2    TUMOR MARKERS: No results for input(s): AFPTM, CEA, CA199, CHROMGRNA in the last 8760 hours.  Assessment and Plan:  51 year old with uterine fibroids and menorrhagia. Her menstrual bleeding is inconsistent and unpredictable at this time. Patient is very frustrated and seriously considering treatment options. Patient had an MRI on 02/21/2017. Patient has a large uterus with multiple fibroids. Patient would be a candidate for uterine artery embolization based on her history and MRI findings. Patient is still unsure whether or not she wants to undergo any treatment or wait until menopause. I recommend that she consult her gynecologist for additional information about menopause  and perimenopausal symptoms. The patient would be a candidate for uterine artery embolization if that is the treatment option she desires. She will contact us in the near future.   Thank you for this interesting consult.  I greatly enjoyed meeting Heidi Barrett and look forward to participating in their care.  A copy of this report was sent to the requesting provider on this date.  Electronically Signed: Carylon Perches 03/06/2017, 11:41 AM   I spent a total of    15 Minutes in face to face in clinical consultation, greater than 50% of which was counseling/coordinating care for uterine fibroids and menorrhagia.

## 2017-03-13 ENCOUNTER — Telehealth: Payer: Self-pay | Admitting: Obstetrics & Gynecology

## 2017-03-13 NOTE — Telephone Encounter (Signed)
Patient is requesting a call back. She needs to know if she should get a refill on a certain type of medication.

## 2017-03-24 NOTE — Telephone Encounter (Signed)
Brittanya called back and states she never had the procedure because her Mom got sick and she took 2 months off work. States she recently saw Dr. Anselm Pancoast- radiology and he said having the procedure [place] was not an emergency and she may be in perimenopause and may not need it, may need labs and meds. She wanted to ask Dr. Ihor Dow if she could get meds. She states she is not having pain and in July was having spotting , then bleeding. I explained I will forward to Dr. Ihor Dow and we will get back to her once we hear what the plan is from her.

## 2017-03-24 NOTE — Telephone Encounter (Signed)
I called Heidi Barrett back and left a message I am returning her call, I apologized for delay in someone calling back. I left a message asking her to call back and leave a more detailed message about what medication she is needing a refill.

## 2017-03-26 ENCOUNTER — Other Ambulatory Visit: Payer: Self-pay | Admitting: Obstetrics & Gynecology

## 2017-03-26 MED ORDER — MEGESTROL ACETATE 40 MG PO TABS: 40.0000 mg | ORAL_TABLET | Freq: Every day | ORAL | 2 refills | Status: DC

## 2017-03-26 NOTE — Telephone Encounter (Signed)
Called Heidi Barrett and informed her that she may restart megace and new prescription has been sent to pharmacy. Also discussed with Heidi Barrett that Dr Ihor Dow would like to see her back in October. Heidi Barrett verbalized understanding to all & had no questions. Will await phone call from front office staff with appt.

## 2017-04-23 ENCOUNTER — Ambulatory Visit: Payer: No Typology Code available for payment source | Admitting: Internal Medicine

## 2017-05-07 ENCOUNTER — Ambulatory Visit: Payer: No Typology Code available for payment source | Admitting: Internal Medicine

## 2017-05-07 ENCOUNTER — Encounter: Payer: Self-pay | Admitting: Internal Medicine

## 2017-05-07 ENCOUNTER — Ambulatory Visit: Payer: No Typology Code available for payment source | Attending: Internal Medicine | Admitting: Internal Medicine

## 2017-05-07 VITALS — BP 131/84 | HR 92 | Temp 97.9°F | Resp 18 | Ht 68.0 in | Wt 192.0 lb

## 2017-05-07 DIAGNOSIS — A09 Infectious gastroenteritis and colitis, unspecified: Secondary | ICD-10-CM

## 2017-05-07 DIAGNOSIS — Z885 Allergy status to narcotic agent status: Secondary | ICD-10-CM | POA: Insufficient documentation

## 2017-05-07 DIAGNOSIS — Z888 Allergy status to other drugs, medicaments and biological substances status: Secondary | ICD-10-CM | POA: Insufficient documentation

## 2017-05-07 DIAGNOSIS — Z79890 Hormone replacement therapy (postmenopausal): Secondary | ICD-10-CM | POA: Insufficient documentation

## 2017-05-07 DIAGNOSIS — I10 Essential (primary) hypertension: Secondary | ICD-10-CM

## 2017-05-07 DIAGNOSIS — Z23 Encounter for immunization: Secondary | ICD-10-CM

## 2017-05-07 DIAGNOSIS — Z79899 Other long term (current) drug therapy: Secondary | ICD-10-CM | POA: Insufficient documentation

## 2017-05-07 DIAGNOSIS — R197 Diarrhea, unspecified: Secondary | ICD-10-CM | POA: Insufficient documentation

## 2017-05-07 DIAGNOSIS — E038 Other specified hypothyroidism: Secondary | ICD-10-CM | POA: Insufficient documentation

## 2017-05-07 MED ORDER — CIPROFLOXACIN HCL 500 MG PO TABS: 500.0000 mg | ORAL_TABLET | Freq: Two times a day (BID) | ORAL | 0 refills | Status: AC

## 2017-05-07 MED ORDER — METRONIDAZOLE 500 MG PO TABS: 500.0000 mg | ORAL_TABLET | Freq: Two times a day (BID) | ORAL | 0 refills | Status: AC

## 2017-05-07 MED ORDER — LEVOTHYROXINE SODIUM 150 MCG PO TABS: 150.0000 ug | ORAL_TABLET | Freq: Every day | ORAL | 3 refills | Status: DC

## 2017-05-07 MED ORDER — MEGESTROL ACETATE 40 MG PO TABS: 40.0000 mg | ORAL_TABLET | Freq: Every day | ORAL | 2 refills | Status: DC

## 2017-05-07 MED ORDER — AMLODIPINE BESYLATE 10 MG PO TABS: ORAL_TABLET | ORAL | 3 refills | Status: DC

## 2017-05-07 NOTE — Patient Instructions (Signed)

## 2017-05-07 NOTE — Progress Notes (Signed)
Heidi Barrett, is a 51 y.o. female  WNU:[id number]  IHK:[id number]  DOB - [birth year]     Subjective:   Heidi Barrett is a 51 y.o. female with history of hypertension, hypothyroidism and chronic anemia from DUB secondary to multiple uterine fibroids who presents here today for a follow up visit and medication refills. She however is complaining of abdominal pain, bloating and diarrhea for few days now. ++ Mucous in stool but no blood. Patient is adherent to her medications, BP is controlled, no side effect noticed. She has no recollection of likely offending factor for her abd pain and diarrhea. No nausea or vomiting. Patient has No headache, No chest pain, No new weakness tingling or numbness, No Cough - SOB.  No problems updated.  ALLERGIES: Allergies  Allergen Reactions  . Hctz [Hydrochlorothiazide] Shortness Of Breath and Other (See Comments)    wheezing  . Hydrocodone Nausea And Vomiting    dizzines  . Percocet [Oxycodone-Acetaminophen]   . Tramadol     PAST MEDICAL HISTORY: Past Medical History:  Diagnosis Date  . Anemia   . Hypertension   . Thyroid disease     MEDICATIONS AT HOME: Prior to Admission medications   Medication Sig Start Date End Date Taking? Authorizing Provider  amLODipine (NORVASC) 10 MG tablet TAKE 1 TABLET BY MOUTH DAILY. FOR BLOOD PRESSURE. 05/07/17   [name], MD  calcium carbonate (CALCIUM 600) 600 MG TABS tablet Take 1 tablet (600 mg total) by mouth daily with breakfast. 05/31/14   [name], MD  cholecalciferol (VITAMIN D) 1000 UNITS tablet Take 1 tablet (1,000 Units total) by mouth daily. 05/31/14   [name], MD  ciprofloxacin (CIPRO) 500 MG tablet Take 1 tablet (500 mg total) by mouth 2 (two) times daily. 05/07/17 05/14/17  [name], MD  ibuprofen (ADVIL,MOTRIN) 600 MG tablet Take 1 tablet (600 mg total) by mouth every 6 (six) hours as needed. 11/10/15   Ashley Murrain, NP  levothyroxine  (SYNTHROID, LEVOTHROID) 150 MCG tablet Take 1 tablet (150 mcg total) by mouth daily before breakfast. 05/07/17   [name], MD  megestrol (MEGACE) 40 MG tablet Take 1 tablet (40 mg total) by mouth daily. 05/07/17   [name], MD  metroNIDAZOLE (FLAGYL) 500 MG tablet Take 1 tablet (500 mg total) by mouth 2 (two) times daily. 05/07/17 05/14/17  [name], MD  Multiple Vitamin (MULTIVITAMIN WITH MINERALS) TABS tablet Take 1 tablet by mouth daily. 05/31/14   [name], MD    Objective:   Vitals:   05/07/17 1033  BP: 131/84  Pulse: 92  Resp: 18  Temp: 97.9 F (36.6 C)  TempSrc: Oral  SpO2: 92%  Weight: 192 lb (87.1 kg)  Height: '5\' 8"'$  (1.727 m)   Exam General appearance : Awake, alert, not in any distress. Speech Clear. Not toxic looking HEENT: Atraumatic and Normocephalic, pupils equally reactive to light and accomodation Neck: Supple, no JVD. No cervical lymphadenopathy.  Chest: Good air entry bilaterally, no added sounds  CVS: S1 S2 regular, no murmurs.  Abdomen: Bowel sounds present, Non tender and not distended with no gaurding, rigidity or rebound. Extremities: B/L Lower Ext shows no edema, both legs are warm to touch Neurology: Awake alert, and oriented X 3, CN II-XII intact, Non focal Skin: No Rash  Data Review Lab Results  Component Value Date   HGBA1C 4.5 05/31/2014    Assessment & Plan   1. Essential hypertension, benign  -  amLODipine (NORVASC) 10 MG tablet; TAKE 1 TABLET BY MOUTH DAILY. FOR BLOOD PRESSURE.  Dispense: 90 tablet; Refill: 3 - megestrol (MEGACE) 40 MG tablet; Take 1 tablet (40 mg total) by mouth daily.  Dispense: 30 tablet; Refill: 2  - CBC with Differential/Platelet - CMP14+EGFR - Lipid panel  2. Diarrhea of infectious origin  - ciprofloxacin (CIPRO) 500 MG tablet; Take 1 tablet (500 mg total) by mouth 2 (two) times daily.  Dispense: 14 tablet; Refill: 0 - metroNIDAZOLE (FLAGYL) 500 MG tablet; Take 1  tablet (500 mg total) by mouth 2 (two) times daily.  Dispense: 14 tablet; Refill: 0 - Urinalysis, Complete - Fecal leukocytes - Clostridium difficile EIA  3. Other specified hypothyroidism  - levothyroxine (SYNTHROID, LEVOTHROID) 150 MCG tablet; Take 1 tablet (150 mcg total) by mouth daily before breakfast.  Dispense: 90 tablet; Refill: 3 - TSH - T4, Free  Patient have been counseled extensively about nutrition and exercise. Other issues discussed during this visit include: low cholesterol diet, weight control and daily exercise, importance of adherence with medications and regular follow-up. We also discussed long term complications of uncontrolled hypertension.   Return in about 6 months (around 11/05/2017) for Routine Follow Up.  The patient was given clear instructions to go to ER or return to medical center if symptoms don't improve, worsen or new problems develop. The patient verbalized understanding. The patient was told to call to get lab results if they haven't heard anything in the next week.   This note has been created with Surveyor, quantity. Any transcriptional errors are unintentional.    Angelica Chessman, MD, [place], [place], [place], [place] and [place], [place]   05/07/2017, 11:02 AM

## 2017-05-08 LAB — LIPID PANEL
CHOL/HDL RATIO: 2.9 ratio (ref 0.0–4.4)
Cholesterol, Total: 194 mg/dL (ref 100–199)
HDL: 68 mg/dL (ref >39)
LDL CALC: 111 mg/dL — AB (ref 0–99)
TRIGLYCERIDES: 73 mg/dL (ref 0–149)
VLDL Cholesterol Cal: 15 mg/dL (ref 5–40)

## 2017-05-08 LAB — CBC WITH DIFFERENTIAL/PLATELET
Basophils Absolute: 0 10*3/uL (ref 0.0–0.2)
Basos: 0 %
EOS (ABSOLUTE): 0.2 10*3/uL (ref 0.0–0.4)
Eos: 7 %
Hematocrit: 36.9 % (ref 34.0–46.6)
Hemoglobin: 12.9 g/dL (ref 11.1–15.9)
IMMATURE GRANULOCYTES: 0 %
Immature Grans (Abs): 0 10*3/uL (ref 0.0–0.1)
LYMPHS ABS: 1.1 10*3/uL (ref 0.7–3.1)
Lymphs: 38 %
MCH: 30.8 pg (ref 26.6–33.0)
MCHC: 35 g/dL (ref 31.5–35.7)
MCV: 88 fL (ref 79–97)
MONOS ABS: 0.2 10*3/uL (ref 0.1–0.9)
Monocytes: 8 %
NEUTROS PCT: 47 %
Neutrophils Absolute: 1.3 10*3/uL — ABNORMAL LOW (ref 1.4–7.0)
PLATELETS: 112 10*3/uL — AB (ref 150–379)
RBC: 4.19 x10E6/uL (ref 3.77–5.28)
RDW: 13 % (ref 12.3–15.4)
WBC: 2.8 10*3/uL — AB (ref 3.4–10.8)

## 2017-05-08 LAB — URINALYSIS, COMPLETE
BILIRUBIN UA: NEGATIVE
GLUCOSE, UA: NEGATIVE
KETONES UA: NEGATIVE
Leukocytes, UA: NEGATIVE
Nitrite, UA: NEGATIVE
PROTEIN UA: NEGATIVE
RBC, UA: NEGATIVE
SPEC GRAV UA: 1.014 (ref 1.005–1.030)
Urobilinogen, Ur: 0.2 mg/dL (ref 0.2–1.0)
pH, UA: 6.5 (ref 5.0–7.5)

## 2017-05-08 LAB — CMP14+EGFR
A/G RATIO: 1.8 (ref 1.2–2.2)
ALK PHOS: 40 IU/L (ref 39–117)
ALT: 22 IU/L (ref 0–32)
AST: 18 IU/L (ref 0–40)
Albumin: 4.4 g/dL (ref 3.5–5.5)
BUN/Creatinine Ratio: 13 (ref 9–23)
BUN: 11 mg/dL (ref 6–24)
Bilirubin Total: 0.5 mg/dL (ref 0.0–1.2)
CALCIUM: 9.3 mg/dL (ref 8.7–10.2)
CO2: 22 mmol/L (ref 20–29)
CREATININE: 0.85 mg/dL (ref 0.57–1.00)
Chloride: 102 mmol/L (ref 96–106)
GFR calc Af Amer: 93 mL/min/{1.73_m2} (ref >59)
GFR, EST NON AFRICAN AMERICAN: 81 mL/min/{1.73_m2} (ref >59)
Globulin, Total: 2.4 g/dL (ref 1.5–4.5)
Glucose: 78 mg/dL (ref 65–99)
Potassium: 3.9 mmol/L (ref 3.5–5.2)
SODIUM: 139 mmol/L (ref 134–144)
Total Protein: 6.8 g/dL (ref 6.0–8.5)

## 2017-05-08 LAB — MICROSCOPIC EXAMINATION
Bacteria, UA: NONE SEEN
Casts: NONE SEEN /lpf

## 2017-05-08 LAB — TSH: TSH: 2.49 u[IU]/mL (ref 0.450–4.500)

## 2017-05-08 LAB — T4, FREE: FREE T4: 1.57 ng/dL (ref 0.82–1.77)

## 2017-05-12 ENCOUNTER — Encounter: Payer: Self-pay | Admitting: *Deleted

## 2017-05-12 ENCOUNTER — Ambulatory Visit: Payer: No Typology Code available for payment source | Attending: Internal Medicine

## 2017-05-12 DIAGNOSIS — A09 Infectious gastroenteritis and colitis, unspecified: Secondary | ICD-10-CM | POA: Insufficient documentation

## 2017-05-12 NOTE — Progress Notes (Signed)
Patient here for lab visit only 

## 2017-05-13 ENCOUNTER — Telehealth: Payer: Self-pay | Admitting: *Deleted

## 2017-05-13 NOTE — Telephone Encounter (Signed)
MA was unable to leave a message on patients voicemail.

## 2017-05-13 NOTE — Telephone Encounter (Signed)
-----   Message from [name], MD sent at 05/12/2017 12:44 PM EDT ----- Lab results are normal, awaiting stool test results

## 2017-05-14 LAB — CLOSTRIDIUM DIFFICILE EIA: C difficile Toxins A+B, EIA: NEGATIVE

## 2017-05-19 ENCOUNTER — Telehealth: Payer: Self-pay | Admitting: Internal Medicine

## 2017-05-19 ENCOUNTER — Encounter: Payer: Self-pay | Admitting: Diagnostic Radiology

## 2017-05-19 NOTE — Telephone Encounter (Signed)
Pt called , pt was informed of results. Pt understood that we will call her once stool results are in .

## 2017-05-23 LAB — FECAL LEUKOCYTES

## 2017-05-27 ENCOUNTER — Ambulatory Visit (INDEPENDENT_AMBULATORY_CARE_PROVIDER_SITE_OTHER): Payer: Self-pay | Admitting: Obstetrics & Gynecology

## 2017-05-27 ENCOUNTER — Encounter: Payer: Self-pay | Admitting: Obstetrics & Gynecology

## 2017-05-27 VITALS — BP 148/99 | HR 78 | Wt 193.0 lb

## 2017-05-27 DIAGNOSIS — D250 Submucous leiomyoma of uterus: Secondary | ICD-10-CM

## 2017-05-27 DIAGNOSIS — D252 Subserosal leiomyoma of uterus: Secondary | ICD-10-CM

## 2017-05-27 DIAGNOSIS — N939 Abnormal uterine and vaginal bleeding, unspecified: Secondary | ICD-10-CM

## 2017-05-27 NOTE — Progress Notes (Signed)
.  chs Subjective:     Heidi Barrett is a 51 y.o. female here for a routine exam.  G0 LMP Oct 2nd. Cycles are now 21 days.  Pt denies bleeding between cycles since July. Essentially had spotting in June and July then went back to normal. Current complaints: AUB.  Pt reports that she was having irreg menses with some light spotting between cycles. She is now having normal cycles with no bleeding between cycles. Pt has not taken the Megace. Pt reports +hot flushes. + mood changes (this was noted by her mother who she lives with).    At present to is not having heavy bleeding or bleeding between cycles for >2 months.     Gynecologic History Patient's last menstrual period was 04/06/2017 (exact date). Contraception: abstinence Last Pap: 05/20/2016. Results were: normal Last mammogram: had prev 5 years.  Results were: normal  Obstetric History OB History  Gravida Para Term Preterm AB Living  0            SAB TAB Ectopic Multiple Live Births                  The following portions of the patient's history were reviewed and updated as appropriate: allergies, current medications, past family history, past medical history, past social history, past surgical history and problem list.  Review of Systems Pertinent items are noted in HPI.    Objective:  BP (!) 148/99   Pulse 78   Wt 193 lb (87.5 kg)   LMP 04/06/2017 (Exact Date)   BMI 29.35 kg/m   CONSTITUTIONAL: Well-developed, well-nourished female in no acute distress.  HENT:  Normocephalic, atraumatic EYES: Conjunctivae and EOM are normal. No scleral icterus.  NECK: Normal range of motion SKIN: Skin is warm and dry. No rash noted. Not diaphoretic.No pallor. [place]: Alert and oriented to person, place, and time. Normal coordination.  GU: EGBUS: no lesions Vagina: no blood in vault Cervix: no lesion; no mucopurulent d/c Uterus: Enlarged 16 weeks sized. Very mobile. Large right sided fibroid palpated. Nontender Adnexa: no masses;  nontender    Diagnosis Endometrium, biopsy - DISORDERED PROLIFERATIVE-TYPE ENDOMETRIUM. - BENIGN ENDOCERVICAL-TYPE MUCOSA. - THERE IS NO EVIDENCE OF HYPERPLASIA OR MALIGNANCY. Assessment:    Healthy female exam.   AUB Fibroids uterus- pt denies pressure sx.   Plan:  I have reviewed with pt treatmetn options including meds. Pt was prev given a Rx which she has not tried. I reviewed the risks and SE of the meds. She wants low intervention so this could be a good option for her. She has also considered the [place]. This is also a good option for her. She had an Endobx 1 year ago. She is worried about taking time off from work and I did explain that with this option she would need some time off for pain control.  She was also offered surgery but, this would also require time away form work. She works in a [employer] and does a lot of lifting. Pt wants to try the medication option first.     Megace 40mg  1 po q day    Pt completed the Mammogram grant from  F/u in 3 months or sooner prn  Total face-to-face time with patient was 25 min.  Greater than 50% was spent in counseling and coordination of care with the patient.   Royston Bekele L. Harraway-Smith, M.D., [name]

## 2017-05-27 NOTE — Progress Notes (Signed)
Pt stated having heavy bleeding in the month of June-July....but know the menstrual  cycle is  back to normal 5 days.

## 2017-05-27 NOTE — Patient Instructions (Signed)
Perimenopause Perimenopause is the time when your body begins to move into the menopause (no menstrual period for 12 straight months). It is a natural process. Perimenopause can begin 2-8 years before the menopause and usually lasts for 1 year after the menopause. During this time, your ovaries may or may not produce an egg. The ovaries vary in their production of estrogen and progesterone hormones each month. This can cause irregular menstrual periods, difficulty getting pregnant, vaginal bleeding between periods, and uncomfortable symptoms. What are the causes?  Irregular production of the ovarian hormones, estrogen and progesterone, and not ovulating every month. Other causes include:  Tumor of the pituitary gland in the brain.  Medical disease that affects the ovaries.  Radiation treatment.  Chemotherapy.  Unknown causes.  Heavy smoking and excessive alcohol intake can bring on perimenopause sooner. What are the signs or symptoms?  Hot flashes.  Night sweats.  Irregular menstrual periods.  Decreased sex drive.  Vaginal dryness.  Headaches.  Mood swings.  Depression.  Memory problems.  Irritability.  Tiredness.  Weight gain.  Trouble getting pregnant.  The beginning of losing bone cells (osteoporosis).  The beginning of hardening of the arteries (atherosclerosis). How is this diagnosed? Your health care provider will make a diagnosis by analyzing your age, menstrual history, and symptoms. He or she will do a physical exam and note any changes in your body, especially your female organs. Female hormone tests may or may not be helpful depending on the amount of female hormones you produce and when you produce them. However, other hormone tests may be helpful to rule out other problems. How is this treated? In some cases, no treatment is needed. The decision on whether treatment is necessary during the perimenopause should be made by you and your health care  provider based on how the symptoms are affecting you and your lifestyle. Various treatments are available, such as:  Treating individual symptoms with a specific medicine for that symptom.  Herbal medicines that can help specific symptoms.  Counseling.  Group therapy. Follow these instructions at home:  Keep track of your menstrual periods (when they occur, how heavy they are, how long between periods, and how long they last) as well as your symptoms and when they started.  Only take over-the-counter or prescription medicines as directed by your health care provider.  Sleep and rest.  Exercise.  Eat a diet that contains calcium (good for your bones) and soy (acts like the estrogen hormone).  Do not smoke.  Avoid alcoholic beverages.  Take vitamin supplements as recommended by your health care provider. Taking vitamin E may help in certain cases.  Take calcium and vitamin D supplements to help prevent bone loss.  Group therapy is sometimes helpful.  Acupuncture may help in some cases. Contact a health care provider if:  You have questions about any symptoms you are having.  You need a referral to a specialist (gynecologist, psychiatrist, or psychologist). Get help right away if:  You have vaginal bleeding.  Your period lasts longer than 8 days.  Your periods are recurring sooner than 21 days.  You have bleeding after intercourse.  You have severe depression.  You have pain when you urinate.  You have severe headaches.  You have vision problems. This information is not intended to replace advice given to you by your health care provider. Make sure you discuss any questions you have with your health care provider. Document Released: 08/29/2004 Document Revised: 12/28/2015 Document Reviewed: 02/18/2013 Elsevier Interactive   Patient Education  2017 [employer]vier Inc.  

## 2017-05-28 ENCOUNTER — Telehealth: Payer: Self-pay | Admitting: Internal Medicine

## 2017-05-28 NOTE — Telephone Encounter (Signed)
Pt called to get lab result, please call her back

## 2017-05-28 NOTE — Telephone Encounter (Signed)
-----   Message from [name], MD sent at 05/28/2017  8:37 AM EDT ----- Stool test is negative for infection

## 2017-05-28 NOTE — Telephone Encounter (Signed)
Medical Assistant left message on patient's home and cell voicemail. Voicemail states to give a call back to [place] with [facility] at [phone]. Patient is aware of stool being negative for infection.

## 2017-06-03 ENCOUNTER — Other Ambulatory Visit: Payer: Self-pay | Admitting: Obstetrics & Gynecology

## 2017-06-03 DIAGNOSIS — Z1231 Encounter for screening mammogram for malignant neoplasm of breast: Secondary | ICD-10-CM

## 2017-07-18 ENCOUNTER — Emergency Department (HOSPITAL_COMMUNITY): Payer: Self-pay

## 2017-07-18 ENCOUNTER — Other Ambulatory Visit: Payer: Self-pay

## 2017-07-18 ENCOUNTER — Emergency Department (HOSPITAL_COMMUNITY)
Admission: EM | Admit: 2017-07-18 | Discharge: 2017-07-18 | Disposition: A | Discharge status: Home / Self Care | Payer: Self-pay | Attending: Emergency Medicine | Admitting: Emergency Medicine

## 2017-07-18 DIAGNOSIS — Z79899 Other long term (current) drug therapy: Secondary | ICD-10-CM | POA: Insufficient documentation

## 2017-07-18 DIAGNOSIS — D649 Anemia, unspecified: Secondary | ICD-10-CM | POA: Insufficient documentation

## 2017-07-18 DIAGNOSIS — J40 Bronchitis, not specified as acute or chronic: Secondary | ICD-10-CM | POA: Insufficient documentation

## 2017-07-18 DIAGNOSIS — Z885 Allergy status to narcotic agent status: Secondary | ICD-10-CM | POA: Insufficient documentation

## 2017-07-18 DIAGNOSIS — E039 Hypothyroidism, unspecified: Secondary | ICD-10-CM | POA: Insufficient documentation

## 2017-07-18 DIAGNOSIS — I10 Essential (primary) hypertension: Secondary | ICD-10-CM | POA: Insufficient documentation

## 2017-07-18 DIAGNOSIS — J069 Acute upper respiratory infection, unspecified: Secondary | ICD-10-CM | POA: Insufficient documentation

## 2017-07-18 LAB — D-DIMER, QUANTITATIVE: D-Dimer, Quant: 0.49 ug/mL-FEU (ref 0.00–0.50)

## 2017-07-18 MED ORDER — ALBUTEROL SULFATE HFA 108 (90 BASE) MCG/ACT IN AERS
2.0000 | INHALATION_SPRAY | Freq: Once | RESPIRATORY_TRACT | Status: AC
  Administered 2017-07-18: 2 via RESPIRATORY_TRACT
  Filled 2017-07-18: qty 6.7

## 2017-07-18 MED ORDER — BENZONATATE 100 MG PO CAPS: 100.0000 mg | ORAL_CAPSULE | Freq: Three times a day (TID) | ORAL | 0 refills | Status: DC

## 2017-07-18 NOTE — ED Notes (Signed)
Patient transported to X-ray 

## 2017-07-18 NOTE — ED Provider Notes (Signed)
[facility] EMERGENCY DEPARTMENT Provider Note   CSN: [id number] Arrival date & time: 07/18/17  1008     History   Chief Complaint Chief Complaint  Patient presents with  . Cough  . Sore Throat  . Hypertension    HPI  Heidi Barrett is a 51 y.o. Female with a history of hypertension, hypothyroidism and anemia, presents complaining of 5 days of productive cough, with some associated sore throat and nasal congestion. Patient denies fevers or chills. Patient reports cough is productive of yellow mucus, patient reports 2 episodes of blood-streaked sputum. Patient reports she feels like her sore throat has been getting worse and his swelling, but reports she's been tolerating her secretions and able to eat and drink at home okay. Patient reports some associated chest soreness from coughing, denies shortness of breath, but does for her chest feels tight sometimes during coughing spells. Patient has been using over-the-counter HBP cough and cold medication, with minimal improvement. Patient is concerned that this medication may be interacting with the gas draw she was recently prescribed by her OB/GYN for uterine fibroids. Patient has been taking Tylenol which has helped with sore throat pain. Patient is very concerned that these medications together increasing her blood pressure, and appears very anxious about her blood pressure general. Patient reports occasional frontal sinus pressure and headache, denies any vision changes, dizziness, nausea or vomiting. No abdominal pain, diarrhea or constipation. She does report a history of previous DVT when she was on birth control, has not been on hormone supplements since then until recently when she is placed on megestrol. Patient reports some intermittent lower leg pain, typically with exercise, denies any lower leg swelling.       Past Medical History:  Diagnosis Date  . Anemia   . Hypertension   . Thyroid disease      Patient Active Problem List   Diagnosis Date Noted  . Atypical chest pain 07/12/2016  . Generalized anxiety disorder 07/04/2016  . Right lower quadrant abdominal pain 02/08/2016  . Eye muscle twitches 02/08/2016  . Fibroid, uterine 02/08/2016  . Piriformis syndrome of right side 01/31/2015  . Allergic reaction caused by a drug 01/27/2015  . Heel spur 01/16/2015  . Essential hypertension, benign 01/16/2015  . Benign essential HTN 05/31/2014  . Plantar fasciitis 05/31/2014  . Other specified hypothyroidism 05/31/2014  . Intramural leiomyoma of uterus 05/31/2014  . Menorrhagia 01/04/2013  . Fibroids 01/04/2013    Past Surgical History:  Procedure Laterality Date  . IR GENERIC HISTORICAL  05/28/2016   IR RADIOLOGIST EVAL & MGMT 05/28/2016 Markus Daft, MD [facility] INTERV RAD  . IR RADIOLOGIST EVAL & MGMT  03/06/2017    OB History    Gravida Para Term Preterm AB Living   0             SAB TAB Ectopic Multiple Live Births                   Home Medications    Prior to Admission medications   Medication Sig Start Date End Date Taking? Authorizing Provider  amLODipine (NORVASC) 10 MG tablet TAKE 1 TABLET BY MOUTH DAILY. FOR BLOOD PRESSURE. 05/07/17   [name], MD  calcium carbonate (CALCIUM 600) 600 MG TABS tablet Take 1 tablet (600 mg total) by mouth daily with breakfast. 05/31/14   [name], MD  cholecalciferol (VITAMIN D) 1000 UNITS tablet Take 1 tablet (1,000 Units total) by mouth daily. 05/31/14  [name], MD  ibuprofen (ADVIL,MOTRIN) 600 MG tablet Take 1 tablet (600 mg total) by mouth every 6 (six) hours as needed. 11/10/15   Ashley Murrain, NP  levothyroxine (SYNTHROID, LEVOTHROID) 150 MCG tablet Take 1 tablet (150 mcg total) by mouth daily before breakfast. 05/07/17   [name], MD  megestrol (MEGACE) 40 MG tablet Take 1 tablet (40 mg total) by mouth daily. Patient not taking: Reported on 05/27/2017 05/07/17   [name],  MD  Multiple Vitamin (MULTIVITAMIN WITH MINERALS) TABS tablet Take 1 tablet by mouth daily. 05/31/14   [name], MD    Family History Family History  Problem Relation Age of Onset  . Heart disease Mother   . Stroke Mother   . Cancer Father        prostate    Social History Social History   Tobacco Use  . Smoking status: Never Smoker  . Smokeless tobacco: Never Used  Substance Use Topics  . Alcohol use: No  . Drug use: No     Allergies   Hctz [hydrochlorothiazide]; Hydrocodone; Percocet [oxycodone-acetaminophen]; and Tramadol   Review of Systems Review of Systems  Constitutional: Negative for chills and fever.  HENT: Positive for congestion, postnasal drip, rhinorrhea, sinus pressure and sore throat. Negative for ear discharge, ear pain, trouble swallowing and voice change.   Eyes: Negative for discharge, redness and itching.  Respiratory: Positive for cough and chest tightness. Negative for shortness of breath, wheezing and stridor.   Cardiovascular: Negative for chest pain, palpitations and leg swelling.  Gastrointestinal: Negative for abdominal pain, diarrhea, nausea and vomiting.  Genitourinary: Negative for dysuria.  Musculoskeletal: Negative for arthralgias and myalgias.  Skin: Negative for rash.  Neurological: Positive for headaches. Negative for dizziness, weakness, light-headedness and numbness.     Physical Exam Updated Vital Signs BP (!) 162/112   Pulse 92   Temp 98.3 F (36.8 C) (Oral)   Resp 18   SpO2 100%   Physical Exam  Constitutional: She is oriented to person, place, and time. She appears well-developed and well-nourished. No distress.  HENT:  Head: Normocephalic and atraumatic.  TMs clear with good landmarks, moderate nasal mucosa edema with clear rhinorrhea, posterior oropharynx clear and moist, with some erythema, no edema or exudates, uvula midline, no evidence of PTA, no trismus  Eyes: EOM are normal. Pupils are equal, round,  and reactive to light. Right eye exhibits no discharge. Left eye exhibits no discharge.  Neck: Normal range of motion. Neck supple.  No neck stiffness, normal ROM, NTTP  Pulmonary/Chest: Effort normal and breath sounds normal. No stridor. No respiratory distress. She has no wheezes. She has no rales. She exhibits no tenderness.  Lungs CTA bilaterally with good air movement  Abdominal: Soft. Bowel sounds are normal. She exhibits no distension and no mass. There is no tenderness. There is no guarding.  Musculoskeletal:  No LE edema, calf tenderness or palpable cord  Lymphadenopathy:    She has no cervical adenopathy.  Neurological: She is alert and oriented to person, place, and time. Coordination normal.  Speech is clear, able to follow commands CN III-XII intact Normal strength in upper and lower extremities bilaterally including dorsiflexion and plantar flexion, strong and equal grip strength Sensation normal to light and sharp touch Moves extremities without ataxia, coordination intact  Skin: Skin is warm and dry. Capillary refill takes less than 2 seconds. She is not diaphoretic.  Psychiatric: She has a normal mood and affect. Her behavior  is normal.  Nursing note and vitals reviewed.    ED Treatments / Results  Labs (all labs ordered are listed, but only abnormal results are displayed) Labs Reviewed  D-DIMER, QUANTITATIVE (NOT AT [facility])    EKG  EKG Interpretation None       Radiology Dg Chest 2 View  Result Date: 07/18/2017 CLINICAL DATA:  Productive cough over the last several days. EXAM: CHEST  2 VIEW COMPARISON:  06/20/2016 FINDINGS: Heart size upper limits of normal. Mediastinal shadows are normal. Frontal view shows clear lungs. No effusions. Lateral view shows questionable patchy infiltrate anteriorly above the heart border. However, arm shadows overlie this region. No significant bone finding. IMPRESSION: Probably no active disease. Questionable patchy infiltrate in  the lateral view in the anterior chest above the heart. I favor that this probably relates more to overlapping density from the arms as the frontal view looks clear. Electronically Signed   By: Nelson Chimes M.D.   On: 07/18/2017 11:28    Procedures Procedures (including critical care time)  Medications Ordered in ED Medications  albuterol (PROVENTIL HFA;VENTOLIN HFA) 108 (90 Base) MCG/ACT inhaler 2 puff (2 puffs Inhalation Given 07/18/17 1346)     Initial Impression / Assessment and Plan / ED Course  I have reviewed the triage vital signs and the nursing notes.  Pertinent labs & imaging results that were available during my care of the patient were reviewed by me and considered in my medical decision making (see chart for details).  Patient presents with 5 days of productive cough, with associated nasal congestion and sore throat.  No fevers or chills.  Patient is also very concerned about her blood pressure, and how cold and flu medications may be affecting it.  On exam patient is well-appearing, although anxious, afebrile, mildly hypertensive, vitals otherwise normal.  Exam and symptoms suggestive of upper respiratory infection, lungs clear to auscultation bilaterally, and chest x-ray shows no evidence of pneumonia.  Patient has had 2 episodes of blood-streaked sputum with his cough, no evidence of DVT on exam, but was recently started on megestrol, with history of previous DVT on birth control, will get d-dimer to rule out DVT/PE.  D-dimer normal, making PE very unlikely.  Presentation consistent with upper respiratory infection/acute bronchitis.  Will provide albuterol inhaler to help with cough, as well as prescription for Tessalon Perles. Patient to follow-up early next week with your primary care provider for blood pressure rechecked, improved here in the ED to the 140s, which is comparable to her recent PCP appointments.  Patient stable for discharge home, in no acute distress, discussed  supportive treatment of symptoms, encouraged to drink lots of fluids.  Return precautions discussed.  Patient expressed understanding and is in agreement with plan.   Final Clinical Impressions(s) / ED Diagnoses   Final diagnoses:  Upper respiratory tract infection, unspecified type  Bronchitis    ED Discharge Orders        Ordered    benzonatate (TESSALON) 100 MG capsule  Every 8 hours     07/18/17 1333       Jacqlyn Larsen, [place] 07/19/17 1554    Forde Dandy, MD 07/21/17 [phone]

## 2017-07-18 NOTE — ED Notes (Signed)
Pt remains in radiology 

## 2017-07-18 NOTE — ED Triage Notes (Addendum)
Pt states red mucous productive cough for 3 days, with sore throat. Pt now states she feels like her throat "is closing" no trouble with speech, 100% on room air. PT taking multiple medications and adjusting her medications on her own based on what she thinks she needs/ thought the cough was an allergic reaction and adjusted her blood pressure medications on her own. Has been taking OTC meds for cold cough that say "HBP" on them and has been taking tylenol. Pt is not in any acute distress. No wheezing. Pt also adjusting her hormone medications on her own based on if she has spotting or not.

## 2017-07-18 NOTE — Discharge Instructions (Signed)
Your evaluation today is reassuring, no evidence of blood clot or pneumonia and your blood pressure has improved during your stay in the ED. Symptoms are likely caused by an upper respiratory virus, continue to treat symptoms supportively at home, drink lots of fluids, you may use albuterol inhaler as we discussed, as well as Tessalon Perles to help with cough. You can also use Zyrtec or Flonase to help with nasal congestion, and nasal saline sprays. Tylenol for pain. Please call to schedule a follow-up appointment with her primary doctor for early next week. If you have worsening chest pain, shortness of breath, persistent fevers or other new or concerning symptoms for which return to the ED for suture reevaluation.

## 2017-07-30 NOTE — Telephone Encounter (Signed)
MA contacted patient with no response. Stool test was negative for infection.

## 2017-08-18 ENCOUNTER — Ambulatory Visit: Payer: Self-pay | Attending: Internal Medicine

## 2017-08-27 ENCOUNTER — Ambulatory Visit: Payer: Self-pay | Admitting: Internal Medicine

## 2017-09-02 ENCOUNTER — Other Ambulatory Visit: Payer: Self-pay | Admitting: General Practice

## 2017-09-02 DIAGNOSIS — I10 Essential (primary) hypertension: Secondary | ICD-10-CM

## 2017-09-02 MED ORDER — MEGESTROL ACETATE 40 MG PO TABS: 40.0000 mg | ORAL_TABLET | Freq: Two times a day (BID) | ORAL | 2 refills | Status: DC

## 2017-09-03 ENCOUNTER — Ambulatory Visit: Payer: Self-pay | Admitting: Obstetrics & Gynecology

## 2017-09-10 ENCOUNTER — Encounter: Payer: Self-pay | Admitting: Internal Medicine

## 2017-09-10 ENCOUNTER — Ambulatory Visit: Payer: Self-pay | Attending: Internal Medicine | Admitting: Internal Medicine

## 2017-09-10 VITALS — BP 149/103 | HR 78 | Temp 98.5°F | Resp 16 | Wt 199.8 lb

## 2017-09-10 DIAGNOSIS — I10 Essential (primary) hypertension: Secondary | ICD-10-CM

## 2017-09-10 DIAGNOSIS — R002 Palpitations: Secondary | ICD-10-CM

## 2017-09-10 DIAGNOSIS — Z888 Allergy status to other drugs, medicaments and biological substances status: Secondary | ICD-10-CM | POA: Insufficient documentation

## 2017-09-10 DIAGNOSIS — D259 Leiomyoma of uterus, unspecified: Secondary | ICD-10-CM

## 2017-09-10 DIAGNOSIS — Z79899 Other long term (current) drug therapy: Secondary | ICD-10-CM | POA: Insufficient documentation

## 2017-09-10 MED ORDER — METOPROLOL TARTRATE 25 MG PO TABS
12.5000 mg | ORAL_TABLET | Freq: Two times a day (BID) | ORAL | 3 refills | Status: DC
Start: 2017-09-10 — End: 2017-10-08

## 2017-09-10 NOTE — Progress Notes (Signed)
Pt states since the increase of the megestrol her bp started becoming elevated

## 2017-09-10 NOTE — Progress Notes (Signed)
Heidi Barrett, is a 52 y.o. female  ZOX:[id number]  WJX:[id number]  DOB - 02/04/1966  Chief Complaint  Patient presents with  . Hypertension       Subjective:   Heidi Barrett is a 52 y.o. female with history of hypertension, hypothyroidism and chronic anemia from DUB secondary to multiple uterine fibroids who presents here today for a follow up visit and medication management. Patient claims since the OBGYN increased her Megestrol for DUB, that her BP has been high.BP today is 149/103 mm Hg. Patient also complaining of palpitation. She denies any depression, she denies suicidal ideation or thought. Patient has No headache, No chest pain, No abdominal pain - No Nausea, No new weakness tingling or numbness, No Cough - SOB.  Problem  Palpitation    ALLERGIES: Allergies  Allergen Reactions  . Hctz [Hydrochlorothiazide] Shortness Of Breath and Other (See Comments)    wheezing  . Tramadol Anaphylaxis and Swelling  . Hydrocodone Nausea And Vomiting    dizzines  . Percocet [Oxycodone-Acetaminophen] Nausea And Vomiting and Other (See Comments)    weakness    PAST MEDICAL HISTORY: Past Medical History:  Diagnosis Date  . Anemia   . Hypertension   . Thyroid disease     MEDICATIONS AT HOME: Prior to Admission medications   Medication Sig Start Date End Date Taking? Authorizing Provider  acetaminophen (TYLENOL) 325 MG tablet Take 650 mg by mouth every 6 (six) hours as needed for mild pain.    [provider]  amLODipine (NORVASC) 10 MG tablet TAKE 1 TABLET BY MOUTH DAILY. FOR BLOOD PRESSURE. 05/07/17   [name], MD  benzonatate (TESSALON) 100 MG capsule Take 1 capsule (100 mg total) by mouth every 8 (eight) hours. 07/18/17   Jacqlyn Larsen, PA-C  calcium carbonate (CALCIUM 600) 600 MG TABS tablet Take 1 tablet (600 mg total) by mouth daily with breakfast. 05/31/14   [name], MD  cholecalciferol (VITAMIN D) 1000 UNITS tablet Take 1 tablet  (1,000 Units total) by mouth daily. 05/31/14   [name], MD  ibuprofen (ADVIL,MOTRIN) 600 MG tablet Take 1 tablet (600 mg total) by mouth every 6 (six) hours as needed. Patient taking differently: Take 600 mg by mouth every 6 (six) hours as needed for moderate pain.  11/10/15   Ashley Murrain, NP  levothyroxine (SYNTHROID, LEVOTHROID) 150 MCG tablet Take 1 tablet (150 mcg total) by mouth daily before breakfast. 05/07/17   [name], MD  megestrol (MEGACE) 40 MG tablet Take 1 tablet (40 mg total) by mouth 2 (two) times daily. 09/02/17   Lavonia Drafts, MD  metoprolol tartrate (LOPRESSOR) 25 MG tablet Take 0.5 tablets (12.5 mg total) by mouth 2 (two) times daily. 09/10/17   [name], MD  Multiple Vitamin (MULTIVITAMIN WITH MINERALS) TABS tablet Take 1 tablet by mouth daily. 05/31/14   [name], MD    Objective:   Vitals:   09/10/17 1629  BP: (!) 149/103  Pulse: 78  Resp: 16  Temp: 98.5 F (36.9 C)  TempSrc: Oral  SpO2: 100%  Weight: 199 lb 12.8 oz (90.6 kg)   Exam General appearance : Awake, alert, not in any distress. Speech Clear. Not toxic looking HEENT: Atraumatic and Normocephalic, pupils equally reactive to light and accomodation Neck: Supple, no JVD. No cervical lymphadenopathy.  Chest: Good air entry bilaterally, no added sounds  CVS: S1 S2 regular, no murmurs.  Abdomen: Bowel sounds present, Non tender and not distended with  no gaurding, rigidity or rebound. Extremities: B/L Lower Ext shows no edema, both legs are warm to touch Neurology: Awake alert, and oriented X 3, CN II-XII intact, Non focal Skin: No Rash  Data Review Lab Results  Component Value Date   HGBA1C 4.5 05/31/2014    Assessment & Plan   1. Essential hypertension, benign  - CMP14+EGFR Increase - metoprolol tartrate (LOPRESSOR) 25 MG tablet; Take 0.5 tablets (12.5 mg total) by mouth 2 (two) times daily.  Dispense: 90 tablet; Refill: 3  2. Uterine  leiomyoma, unspecified location  - CBC with Differential/Platelet  3. Palpitation  Increase - metoprolol tartrate (LOPRESSOR) 25 MG tablet; Take 0.5 tablets (12.5 mg total) by mouth 2 (two) times daily.  Dispense: 90 tablet; Refill: 3  Patient have been counseled extensively about nutrition and exercise. Other issues discussed during this visit include: low cholesterol diet, weight control and daily exercise, importance of adherence with medications and regular follow-up. We also discussed long term complications of uncontrolled hypertension.   Return in about 4 weeks (around 10/08/2017) for BP Check, Nurse Visit, CPP Stacey for BP/CBG and DM management.  The patient was given clear instructions to go to ER or return to medical center if symptoms don't improve, worsen or new problems develop. The patient verbalized understanding. The patient was told to call to get lab results if they haven't heard anything in the next week.   This note has been created with Surveyor, quantity. Any transcriptional errors are unintentional.    Angelica Chessman, MD, MHA, Karilyn Cota, [place] and [place], [place]   09/10/2017, 4:54 PM

## 2017-09-10 NOTE — Patient Instructions (Signed)
DASH Eating Plan DASH stands for "Dietary Approaches to Stop Hypertension." The DASH eating plan is a healthy eating plan that has been shown to reduce high blood pressure (hypertension). It may also reduce your risk for type 2 diabetes, heart disease, and stroke. The DASH eating plan may also help with weight loss. What are tips for following this plan? General guidelines  Avoid eating more than 2,300 mg (milligrams) of salt (sodium) a day. If you have hypertension, you may need to reduce your sodium intake to 1,500 mg a day.  Limit alcohol intake to no more than 1 drink a day for nonpregnant women and 2 drinks a day for men. One drink equals 12 oz of beer, 5 oz of wine, or 1 oz of hard liquor.  Work with your health care provider to maintain a healthy body weight or to lose weight. Ask what an ideal weight is for you.  Get at least 30 minutes of exercise that causes your heart to beat faster (aerobic exercise) most days of the week. Activities may include walking, swimming, or biking.  Work with your health care provider or diet and nutrition specialist (dietitian) to adjust your eating plan to your individual calorie needs. Reading food labels  Check food labels for the amount of sodium per serving. Choose foods with less than 5 percent of the Daily Value of sodium. Generally, foods with less than 300 mg of sodium per serving fit into this eating plan.  To find whole grains, look for the word "whole" as the first word in the ingredient list. Shopping  Buy products labeled as "low-sodium" or "no salt added."  Buy fresh foods. Avoid canned foods and premade or frozen meals. Cooking  Avoid adding salt when cooking. Use salt-free seasonings or herbs instead of table salt or sea salt. Check with your health care provider or pharmacist before using salt substitutes.  Do not fry foods. Cook foods using healthy methods such as baking, boiling, grilling, and broiling instead.  Cook with  heart-healthy oils, such as olive, canola, soybean, or sunflower oil. Meal planning   Eat a balanced diet that includes: ? 5 or more servings of fruits and vegetables each day. At each meal, try to fill half of your plate with fruits and vegetables. ? Up to 6-8 servings of whole grains each day. ? Less than 6 oz of lean meat, poultry, or fish each day. A 3-oz serving of meat is about the same size as a deck of cards. One egg equals 1 oz. ? 2 servings of low-fat dairy each day. ? A serving of nuts, seeds, or beans 5 times each week. ? Heart-healthy fats. Healthy fats called Omega-3 fatty acids are found in foods such as flaxseeds and coldwater fish, like sardines, salmon, and mackerel.  Limit how much you eat of the following: ? Canned or prepackaged foods. ? Food that is high in trans fat, such as fried foods. ? Food that is high in saturated fat, such as fatty meat. ? Sweets, desserts, sugary drinks, and other foods with added sugar. ? Full-fat dairy products.  Do not salt foods before eating.  Try to eat at least 2 vegetarian meals each week.  Eat more home-cooked food and less restaurant, buffet, and fast food.  When eating at a restaurant, ask that your food be prepared with less salt or no salt, if possible. What foods are recommended? The items listed may not be a complete list. Talk with your dietitian about what   dietary choices are best for you. Grains Whole-grain or whole-wheat bread. Whole-grain or whole-wheat pasta. Brown rice. Oatmeal. Quinoa. Bulgur. Whole-grain and low-sodium cereals. Pita bread. Low-fat, low-sodium crackers. Whole-wheat flour tortillas. Vegetables Fresh or frozen vegetables (raw, steamed, roasted, or grilled). Low-sodium or reduced-sodium tomato and vegetable juice. Low-sodium or reduced-sodium tomato sauce and tomato paste. Low-sodium or reduced-sodium canned vegetables. Fruits All fresh, dried, or frozen fruit. Canned fruit in natural juice (without  added sugar). Meat and other protein foods Skinless chicken or turkey. Ground chicken or turkey. Pork with fat trimmed off. Fish and seafood. Egg whites. Dried beans, peas, or lentils. Unsalted nuts, nut butters, and seeds. Unsalted canned beans. Lean cuts of beef with fat trimmed off. Low-sodium, lean deli meat. Dairy Low-fat (1%) or fat-free (skim) milk. Fat-free, low-fat, or reduced-fat cheeses. Nonfat, low-sodium ricotta or cottage cheese. Low-fat or nonfat yogurt. Low-fat, low-sodium cheese. Fats and oils Soft margarine without trans fats. Vegetable oil. Low-fat, reduced-fat, or light mayonnaise and salad dressings (reduced-sodium). Canola, safflower, olive, soybean, and sunflower oils. Avocado. Seasoning and other foods Herbs. Spices. Seasoning mixes without salt. Unsalted popcorn and pretzels. Fat-free sweets. What foods are not recommended? The items listed may not be a complete list. Talk with your dietitian about what dietary choices are best for you. Grains Baked goods made with fat, such as croissants, muffins, or some breads. Dry pasta or rice meal packs. Vegetables Creamed or fried vegetables. Vegetables in a cheese sauce. Regular canned vegetables (not low-sodium or reduced-sodium). Regular canned tomato sauce and paste (not low-sodium or reduced-sodium). Regular tomato and vegetable juice (not low-sodium or reduced-sodium). Pickles. Olives. Fruits Canned fruit in a light or heavy syrup. Fried fruit. Fruit in cream or butter sauce. Meat and other protein foods Fatty cuts of meat. Ribs. Fried meat. Bacon. Sausage. Bologna and other processed lunch meats. Salami. Fatback. Hotdogs. Bratwurst. Salted nuts and seeds. Canned beans with added salt. Canned or smoked fish. Whole eggs or egg yolks. Chicken or turkey with skin. Dairy Whole or 2% milk, cream, and half-and-half. Whole or full-fat cream cheese. Whole-fat or sweetened yogurt. Full-fat cheese. Nondairy creamers. Whipped toppings.  Processed cheese and cheese spreads. Fats and oils Butter. Stick margarine. Lard. Shortening. Ghee. Bacon fat. Tropical oils, such as coconut, palm kernel, or palm oil. Seasoning and other foods Salted popcorn and pretzels. Onion salt, garlic salt, seasoned salt, table salt, and sea salt. Worcestershire sauce. Tartar sauce. Barbecue sauce. Teriyaki sauce. Soy sauce, including reduced-sodium. Steak sauce. Canned and packaged gravies. Fish sauce. Oyster sauce. Cocktail sauce. Horseradish that you find on the shelf. Ketchup. Mustard. Meat flavorings and tenderizers. Bouillon cubes. Hot sauce and Tabasco sauce. Premade or packaged marinades. Premade or packaged taco seasonings. Relishes. Regular salad dressings. Where to find more information:  National Heart, Lung, and Blood Institute: www.nhlbi.nih.gov  American Heart Association: www.heart.org Summary  The DASH eating plan is a healthy eating plan that has been shown to reduce high blood pressure (hypertension). It may also reduce your risk for type 2 diabetes, heart disease, and stroke.  With the DASH eating plan, you should limit salt (sodium) intake to 2,300 mg a day. If you have hypertension, you may need to reduce your sodium intake to 1,500 mg a day.  When on the DASH eating plan, aim to eat more fresh fruits and vegetables, whole grains, lean proteins, low-fat dairy, and heart-healthy fats.  Work with your health care provider or diet and nutrition specialist (dietitian) to adjust your eating plan to your individual   calorie needs. This information is not intended to replace advice given to you by your health care provider. Make sure you discuss any questions you have with your health care provider. Document Released: 07/11/2011 Document Revised: 07/15/2016 Document Reviewed: 07/15/2016 Elsevier Interactive Patient Education  2018 [employer] Inc.  Hypertension Hypertension is another name for high blood pressure. High blood pressure  forces your heart to work harder to pump blood. This can cause problems over time. There are two numbers in a blood pressure reading. There is a top number (systolic) over a bottom number (diastolic). It is best to have a blood pressure below 120/80. Healthy choices can help lower your blood pressure. You may need medicine to help lower your blood pressure if:  Your blood pressure cannot be lowered with healthy choices.  Your blood pressure is higher than 130/80.  Follow these instructions at home: Eating and drinking  If directed, follow the DASH eating plan. This diet includes: ? Filling half of your plate at each meal with fruits and vegetables. ? Filling one quarter of your plate at each meal with whole grains. Whole grains include whole wheat pasta, brown rice, and whole grain bread. ? Eating or drinking low-fat dairy products, such as skim milk or low-fat yogurt. ? Filling one quarter of your plate at each meal with low-fat (lean) proteins. Low-fat proteins include fish, skinless chicken, eggs, beans, and tofu. ? Avoiding fatty meat, cured and processed meat, or chicken with skin. ? Avoiding premade or processed food.  Eat less than 1,500 mg of salt (sodium) a day.  Limit alcohol use to no more than 1 drink a day for nonpregnant women and 2 drinks a day for men. One drink equals 12 oz of beer, 5 oz of wine, or 1 oz of hard liquor. Lifestyle  Work with your doctor to stay at a healthy weight or to lose weight. Ask your doctor what the best weight is for you.  Get at least 30 minutes of exercise that causes your heart to beat faster (aerobic exercise) most days of the week. This may include walking, swimming, or biking.  Get at least 30 minutes of exercise that strengthens your muscles (resistance exercise) at least 3 days a week. This may include lifting weights or pilates.  Do not use any products that contain nicotine or tobacco. This includes cigarettes and e-cigarettes. If you  need help quitting, ask your doctor.  Check your blood pressure at home as told by your doctor.  Keep all follow-up visits as told by your doctor. This is important. Medicines  Take over-the-counter and prescription medicines only as told by your doctor. Follow directions carefully.  Do not skip doses of blood pressure medicine. The medicine does not work as well if you skip doses. Skipping doses also puts you at risk for problems.  Ask your doctor about side effects or reactions to medicines that you should watch for. Contact a doctor if:  You think you are having a reaction to the medicine you are taking.  You have headaches that keep coming back (recurring).  You feel dizzy.  You have swelling in your ankles.  You have trouble with your vision. Get help right away if:  You get a very bad headache.  You start to feel confused.  You feel weak or numb.  You feel faint.  You get very bad pain in your: ? Chest. ? Belly (abdomen).  You throw up (vomit) more than once.  You have trouble breathing.   Summary  Hypertension is another name for high blood pressure.  Making healthy choices can help lower blood pressure. If your blood pressure cannot be controlled with healthy choices, you may need to take medicine. This information is not intended to replace advice given to you by your health care provider. Make sure you discuss any questions you have with your health care provider. Document Released: 01/08/2008 Document Revised: 06/19/2016 Document Reviewed: 06/19/2016 Elsevier Interactive Patient Education  2018 Elsevier Inc.  

## 2017-10-08 ENCOUNTER — Other Ambulatory Visit: Payer: Self-pay | Admitting: Obstetrics & Gynecology

## 2017-10-08 ENCOUNTER — Ambulatory Visit: Payer: Self-pay | Attending: Internal Medicine | Admitting: Pharmacist

## 2017-10-08 DIAGNOSIS — Z79899 Other long term (current) drug therapy: Secondary | ICD-10-CM | POA: Insufficient documentation

## 2017-10-08 DIAGNOSIS — Z1231 Encounter for screening mammogram for malignant neoplasm of breast: Secondary | ICD-10-CM

## 2017-10-08 DIAGNOSIS — I10 Essential (primary) hypertension: Secondary | ICD-10-CM | POA: Insufficient documentation

## 2017-10-08 DIAGNOSIS — R002 Palpitations: Secondary | ICD-10-CM

## 2017-10-08 MED ORDER — METOPROLOL TARTRATE 25 MG PO TABS: 25.0000 mg | ORAL_TABLET | Freq: Two times a day (BID) | ORAL | 2 refills | Status: DC

## 2017-10-08 NOTE — Progress Notes (Signed)
   S:    Patient arrives in good spirits. Presents to the clinic for hypertension evaluation.  Patient reports adherence with medications.  Current BP Medications include:  Amlodipine 10 mg daily, metoprolol 12.5 mg 1/2 tablet BID.   Of note, patient started metoprolol to help with palpitations. 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  Antihypertensives tried in the past include: HCTZ (SOB)   O:   Last 3 Office BP readings: BP Readings from Last 3 Encounters:  10/08/17 (!) 133/93  09/10/17 (!) 149/103  07/18/17 (!) 144/97    BMET    Component Value Date/Time   NA 139 05/07/2017 1120   K 3.9 05/07/2017 1120   CL 102 05/07/2017 1120   CO2 22 05/07/2017 1120   GLUCOSE 78 05/07/2017 1120   GLUCOSE 94 06/19/2016 2347   BUN 11 05/07/2017 1120   CREATININE 0.85 05/07/2017 1120   CREATININE 0.82 01/16/2015 1041   CALCIUM 9.3 05/07/2017 1120   GFRNONAA 81 05/07/2017 1120   GFRNONAA 85 01/16/2015 1041   GFRAA 93 05/07/2017 1120   GFRAA >89 01/16/2015 1041    A/P: Hypertension longstanding currently uncontrolled on current medications but almost to goal.  Increased dose of metoprolol to 25 mg BID. Patient checks blood pressure at home and will monitor for bradycardia and let us know if she has any pulses <60.  Results reviewed and written information provided.   Total time in face-to-face counseling 15 minutes.   F/U Clinic Visit with PCP for palpitations, return to see me with any hypotension or bradycardia prior to that visit.

## 2017-10-08 NOTE — Patient Instructions (Signed)
Thanks for coming to see me  Increase metoprolol to 1 tablet twice daily  If you notice your pulse is <60, let us know   DASH Eating Plan DASH stands for "Dietary Approaches to Stop Hypertension." The DASH eating plan is a healthy eating plan that has been shown to reduce high blood pressure (hypertension). It may also reduce your risk for type 2 diabetes, heart disease, and stroke. The DASH eating plan may also help with weight loss. What are tips for following this plan? General guidelines  Avoid eating more than 2,300 mg (milligrams) of salt (sodium) a day. If you have hypertension, you may need to reduce your sodium intake to 1,500 mg a day.  Limit alcohol intake to no more than 1 drink a day for nonpregnant women and 2 drinks a day for men. One drink equals 12 oz of beer, 5 oz of wine, or 1 oz of hard liquor.  Work with your health care provider to maintain a healthy body weight or to lose weight. Ask what an ideal weight is for you.  Get at least 30 minutes of exercise that causes your heart to beat faster (aerobic exercise) most days of the week. Activities may include walking, swimming, or biking.  Work with your health care provider or diet and nutrition specialist (dietitian) to adjust your eating plan to your individual calorie needs. Reading food labels  Check food labels for the amount of sodium per serving. Choose foods with less than 5 percent of the Daily Value of sodium. Generally, foods with less than 300 mg of sodium per serving fit into this eating plan.  To find whole grains, look for the word "whole" as the first word in the ingredient list. Shopping  Buy products labeled as "low-sodium" or "no salt added."  Buy fresh foods. Avoid canned foods and premade or frozen meals. Cooking  Avoid adding salt when cooking. Use salt-free seasonings or herbs instead of table salt or sea salt. Check with your health care provider or pharmacist before using salt  substitutes.  Do not fry foods. Cook foods using healthy methods such as baking, boiling, grilling, and broiling instead.  Cook with heart-healthy oils, such as olive, canola, soybean, or sunflower oil. Meal planning   Eat a balanced diet that includes: ? 5 or more servings of fruits and vegetables each day. At each meal, try to fill half of your plate with fruits and vegetables. ? Up to 6-8 servings of whole grains each day. ? Less than 6 oz of lean meat, poultry, or fish each day. A 3-oz serving of meat is about the same size as a deck of cards. One egg equals 1 oz. ? 2 servings of low-fat dairy each day. ? A serving of nuts, seeds, or beans 5 times each week. ? Heart-healthy fats. Healthy fats called Omega-3 fatty acids are found in foods such as flaxseeds and coldwater fish, like sardines, salmon, and mackerel.  Limit how much you eat of the following: ? Canned or prepackaged foods. ? Food that is high in trans fat, such as fried foods. ? Food that is high in saturated fat, such as fatty meat. ? Sweets, desserts, sugary drinks, and other foods with added sugar. ? Full-fat dairy products.  Do not salt foods before eating.  Try to eat at least 2 vegetarian meals each week.  Eat more home-cooked food and less restaurant, buffet, and fast food.  When eating at a restaurant, ask that your food be prepared  with less salt or no salt, if possible. What foods are recommended? The items listed may not be a complete list. Talk with your dietitian about what dietary choices are best for you. Grains Whole-grain or whole-wheat bread. Whole-grain or whole-wheat pasta. Brown rice. [place]. Bulgur. Whole-grain and low-sodium cereals. Pita bread. Low-fat, low-sodium crackers. Whole-wheat flour tortillas. Vegetables Fresh or frozen vegetables (raw, steamed, roasted, or grilled). Low-sodium or reduced-sodium tomato and vegetable juice. Low-sodium or reduced-sodium tomato sauce and tomato  paste. Low-sodium or reduced-sodium canned vegetables. Fruits All fresh, dried, or frozen fruit. Canned fruit in natural juice (without added sugar). Meat and other protein foods Skinless chicken or [place]. Ground chicken or [place]. Pork with fat trimmed off. Fish and seafood. Egg whites. Dried beans, peas, or lentils. Unsalted nuts, nut butters, and seeds. Unsalted canned beans. Lean cuts of beef with fat trimmed off. Low-sodium, lean deli meat. Dairy Low-fat (1%) or fat-free (skim) milk. Fat-free, low-fat, or reduced-fat cheeses. Nonfat, low-sodium ricotta or cottage cheese. Low-fat or nonfat yogurt. Low-fat, low-sodium cheese. Fats and oils Soft margarine without trans fats. Vegetable oil. Low-fat, reduced-fat, or light mayonnaise and salad dressings (reduced-sodium). Canola, safflower, olive, soybean, and sunflower oils. Avocado. Seasoning and other foods Herbs. Spices. Seasoning mixes without salt. Unsalted popcorn and pretzels. Fat-free sweets. What foods are not recommended? The items listed may not be a complete list. Talk with your dietitian about what dietary choices are best for you. Grains Baked goods made with fat, such as croissants, muffins, or some breads. Dry pasta or rice meal packs. Vegetables Creamed or fried vegetables. Vegetables in a cheese sauce. Regular canned vegetables (not low-sodium or reduced-sodium). Regular canned tomato sauce and paste (not low-sodium or reduced-sodium). Regular tomato and vegetable juice (not low-sodium or reduced-sodium). Angie Fava. Olives. Fruits Canned fruit in a light or heavy syrup. Fried fruit. Fruit in cream or butter sauce. Meat and other protein foods Fatty cuts of meat. Ribs. Fried meat. Berniece Salines. Sausage. Bologna and other processed lunch meats. Salami. Fatback. Hotdogs. Bratwurst. Salted nuts and seeds. Canned beans with added salt. Canned or smoked fish. Whole eggs or egg yolks. Chicken or [place] with skin. Dairy Whole or 2% milk,  cream, and half-and-half. Whole or full-fat cream cheese. Whole-fat or sweetened yogurt. Full-fat cheese. Nondairy creamers. Whipped toppings. Processed cheese and cheese spreads. Fats and oils Butter. Stick margarine. Lard. Shortening. Ghee. Bacon fat. Tropical oils, such as coconut, palm kernel, or palm oil. Seasoning and other foods Salted popcorn and pretzels. Onion salt, garlic salt, seasoned salt, table salt, and sea salt. Worcestershire sauce. Tartar sauce. Barbecue sauce. Teriyaki sauce. Soy sauce, including reduced-sodium. Steak sauce. Canned and packaged gravies. Fish sauce. Oyster sauce. Cocktail sauce. Horseradish that you find on the shelf. Ketchup. Mustard. Meat flavorings and tenderizers. Bouillon cubes. Hot sauce and Tabasco sauce. Premade or packaged marinades. Premade or packaged taco seasonings. Relishes. Regular salad dressings. Where to find more information:  National Heart, Lung, and [place]: https://wilson-eaton.com/  American Heart Association: www.heart.org Summary  The DASH eating plan is a healthy eating plan that has been shown to reduce high blood pressure (hypertension). It may also reduce your risk for type 2 diabetes, heart disease, and stroke.  With the DASH eating plan, you should limit salt (sodium) intake to 2,300 mg a day. If you have hypertension, you may need to reduce your sodium intake to 1,500 mg a day.  When on the DASH eating plan, aim to eat more fresh fruits and vegetables, whole grains, lean  proteins, low-fat dairy, and heart-healthy fats.  Work with your health care provider or diet and nutrition specialist (dietitian) to adjust your eating plan to your individual calorie needs. This information is not intended to replace advice given to you by your health care provider. Make sure you discuss any questions you have with your health care provider. Document Released: 07/11/2011 Document Revised: 07/15/2016 Document Reviewed: 07/15/2016 Elsevier  Interactive Patient Education  [employer].

## 2017-10-09 ENCOUNTER — Encounter: Payer: Self-pay | Admitting: Pharmacist

## 2017-10-13 ENCOUNTER — Ambulatory Visit (INDEPENDENT_AMBULATORY_CARE_PROVIDER_SITE_OTHER): Payer: Self-pay | Admitting: Obstetrics & Gynecology

## 2017-10-13 ENCOUNTER — Encounter: Payer: Self-pay | Admitting: Obstetrics & Gynecology

## 2017-10-13 VITALS — BP 148/95 | HR 83 | Ht 68.0 in | Wt 201.1 lb

## 2017-10-13 DIAGNOSIS — I10 Essential (primary) hypertension: Secondary | ICD-10-CM

## 2017-10-13 DIAGNOSIS — N939 Abnormal uterine and vaginal bleeding, unspecified: Secondary | ICD-10-CM

## 2017-10-13 DIAGNOSIS — D219 Benign neoplasm of connective and other soft tissue, unspecified: Secondary | ICD-10-CM

## 2017-10-13 MED ORDER — MEGESTROL ACETATE 40 MG PO TABS: 40.0000 mg | ORAL_TABLET | Freq: Two times a day (BID) | ORAL | 2 refills | Status: DC

## 2017-10-13 NOTE — Progress Notes (Signed)
History:  52 y.o. G0P0 here today for f/u of AUB. Pt reports that her cycles are improved on Megace.  she was prev on Megace 40mg  daily with control of sx but, then 2-3 weeks prev her sx became worse so she is now on 40mg  bid since 2-3 weeks prev with no bleeding.   She denies pain or other sx.   The following portions of the patient's history were reviewed and updated as appropriate: allergies, current medications, past family history, past medical history, past social history, past surgical history and problem list.  Review of Systems:  Pertinent items are noted in HPI.   Objective:  Physical Exam BP (!) 148/95 (BP Location: Right Arm)   Pulse 83   Ht 5\' 8"  (1.727 m)   Wt 201 lb 1.6 oz (91.2 kg)   BMI 30.58 kg/m   CONSTITUTIONAL: Well-developed, well-nourished female in no acute distress.  HENT:  Normocephalic, atraumatic EYES: Conjunctivae and EOM are normal. No scleral icterus.  NECK: Normal range of motion SKIN: Skin is warm and dry. No rash noted. Not diaphoretic.No pallor. Amesti: Alert and oriented to person, place, and time. Normal coordination.  Abd: Soft, nontender and nondistended. Mass noted 4 cm ABOVE the umbilicus.  Pelvic: not done  Labs and Imaging 12/10/2012 TRANSABDOMINAL AND TRANSVAGINAL ULTRASOUND OF PELVIS DOPPLER ULTRASOUND OF OVARIES  Technique:  Both transabdominal and transvaginal ultrasound examinations of the pelvis were performed. Transabdominal technique was performed for global imaging of the pelvis including uterus, ovaries, adnexal regions, and pelvic cul-de-sac.  It was necessary to proceed with endovaginal exam following the transabdominal exam to visualize the ovaries.  Color and duplex Doppler ultrasound was utilized to evaluate blood flow to the ovaries.  Comparison:  None.  Findings:  Uterus:  12.5 x 12.0 x 10.5 cm.  Globular in appearance with multiple myometrial masses, likely fibroids, with dominant masses as  follows:  Uterine fundus, intramural, minimal mass effect upon the endometrium, 4.1 x 3.9 x 3.2 cm.  Left posterolateral uterine body, 4.1 x 4.3 x 3.2 cm, intramural  Posterior uterine fundus/body, intramural, with mild mass effect upon the endometrium, 4.6 x 4.4 x 3.4 cm  Endometrium:  7 mm.  Uniformly echogenic without focal abnormality.  Right ovary: 4.1 x 2.6 x 2.6 cm.  Normal.  Left ovary:    3.6 x 3.1 x 2.3 cm.  Normal.  Pulsed Doppler evaluation demonstrates normal low-resistance arterial and venous waveforms in both ovaries.  Small amount of free fluid noted in the right lower quadrant.  IMPRESSION: Uterine fibroids, no acute abnormality.  No sonographic evidence for ovarian torsion.   05/20/2016 Diagnosis Endometrium, biopsy - DISORDERED PROLIFERATIVE-TYPE ENDOMETRIUM. - BENIGN ENDOCERVICAL-TYPE MUCOSA. - THERE IS NO EVIDENCE OF HYPERPLASIA OR MALIGNANCY. Assessment & Plan:  Symptomatic fibroids. I have reviewed with pt her options given that her uterus has increased in size to above the umbilicus including UFE, hysterectomy and myomectomy.   Pt is worried about missing time from work and is still hesitant about having surgery. She was seen prev my Rad to discuss the UFE but, did not follow up. She will need a repeat Endobx because it is >1 year since her last one and she is bleeding more.   Megace 40mg  bid  CBC today Pt to f/u for an endo bx after her visit to radiology.  F/u in 3 months or sooner prn  Total face-to-face time with patient was 20 min.  Greater than 50% was spent in counseling and coordination of  care with the patient.    Henry Demeritt L. Harraway-Smith, M.D., [name]

## 2017-10-13 NOTE — Patient Instructions (Signed)
Uterine Artery Embolization for Fibroids, Care After Refer to this sheet in the next few weeks. These instructions provide you with information on caring for yourself after your procedure. Your health care provider may also give you more specific instructions. Your treatment has been planned according to current medical practices, but problems sometimes occur. Call your health care provider if you have any problems or questions after your procedure. What can I expect after the procedure? After your procedure, it is typical to have cramping in the pelvis. You will be given pain medicine to control it. Follow these instructions at home:  Only take over-the-counter or prescription medicines for pain, discomfort, or fever as directed by your health care provider.  Do not take aspirin. It can cause bleeding.  Follow your health care provider's advice regarding medicines given to you, diet, activity, and when to begin sexual activity.  See your health care provider for follow-up care as directed. Contact a health care provider if:  You have a fever.  You have redness, swelling, and pain around your incision site.  You have pus draining from your incision.  You have a rash. Get help right away if:  You have bleeding from your incision site.  You have difficulty breathing.  You have chest pain.  You have severe abdominal pain.  You have leg pain.  You become dizzy and faint. This information is not intended to replace advice given to you by your health care provider. Make sure you discuss any questions you have with your health care provider. Document Released: 05/12/2013 Document Revised: 12/28/2015 Document Reviewed: 02/04/2013 Elsevier Interactive Patient Education  2018 [employer]. Myomectomy Myomectomy is surgery to remove a noncancerous tumor (myoma) from the uterus. Myomas are tumors made up of fibrous tissue. They are often called fibroid tumors. Fibroid tumors can range  from the size of a pea to the size of a grapefruit. In a myomectomy, the fibroid tumor is removed without removing the uterus. Because these tumors are rarely cancerous, this surgery is usually done only if the tumor is growing or causing symptoms such as pain, pressure, bleeding, or pain with intercourse. LET [facility] CARE PROVIDER KNOW ABOUT:  Any allergies you have.  All medicines you are taking, including vitamins, herbs, eye drops, creams, and over-the-counter medicines.  Previous problems you or members of your family have had with the use of anesthetics.  Any blood disorders you have.  Previous surgeries you have had.  Medical conditions you have. RISKS AND COMPLICATIONS Generally, this is a safe procedure. However, as with any procedure, complications can occur. Possible complications include:  Excessive bleeding.  Infection.  Injury to nearby organs.  Blood clots in the legs, chest, or brain.  Scar tissue on other organs and in the pelvis. This may require another surgery to remove the scar tissue.  BEFORE THE PROCEDURE  Ask your health care provider about changing or stopping your regular medicines. Avoid taking aspirin or blood thinners as directed by your health care provider.  Do not  eat or drink anything after midnight on the night before surgery.  If you smoke, do not  smoke for 2 weeks before the surgery.  Do not  drink alcohol the day before the surgery.  Arrange for someone to drive you home after the procedure or after your hospital stay. Also arrange for someone to help you with activities during your recovery. PROCEDURE You will be given medicine to make you sleep through the procedure (general anesthetic). Any  of the following methods may be used to perform a myomectomy:  Small monitors will be put on your body. They are used to check your heart, blood pressure, and oxygen level.  An IV access tube will be put into one of your veins. Medicine will  be able to flow directly into your body through this IV tube.  You might be given a medicine to help you relax (sedative).  You will be given a medicine to make you sleep (general anesthetic). A breathing tube will be placed into your lungs during the procedure.  A thin, flexible tube (catheter) will be inserted into your bladder to collect urine.  Any of the following methods may be used to perform a myomectomy: ? Hysteroscopic myomectomy-This method may be used when the fibroid tumor is inside the cavity of the uterus. A long, thin tube that is like a telescope (hysteroscope) is inserted inside the uterus. A saline solution is put into your uterus. This expands the uterus and allows the surgeon to see the fibroids. Tools are passed through the hysteroscope to remove the fibroid tumor in pieces. ? Laparoscopic myomectomy-A few small cuts (incisions) are made in the lower abdomen. A thin, lighted tube with a tiny camera on the end (laparoscope) is inserted through one of the incisions. This gives the surgeon a good view of the area. The fibroid tumor is removed through the other incisions. The incisions are then closed with stitches (sutures) or staples. ? Abdominal myomectomy-This method is used when the fibroid tumor cannot be removed with a hysteroscope or laparoscope. The surgery is performed through a larger surgical incision in the abdomen. The fibroid tumor is removed through this incision. The incision is closed with sutures or staples.  What to expect after the procedure  If you had a laparoscopic or hysteroscopic myomectomy, you may be able to go home the same day, or you may need to stay in the hospital overnight.  If you had an abdominal myomectomy, you may need to stay in the hospital for a few days.  Your IV access tube and catheter will be removed in 1-2 days.  You may be given medicine for pain or to help you sleep.  You may be given an antibiotic medicine, if needed. This  information is not intended to replace advice given to you by your health care provider. Make sure you discuss any questions you have with your health care provider. Document Released: 05/19/2007 Document Revised: 12/28/2015 Document Reviewed: 03/03/2013 Elsevier Interactive Patient Education  2017 [place]. Hysterectomy Information A hysterectomy is a surgery to remove your uterus. After surgery, you will no longer have periods. Also, you will not be able to get pregnant. Reasons for this surgery  You have bleeding that is not normal and keeps coming back.  You have lasting (chronic) lower belly (pelvic) pain.  You have a lasting infection.  The lining of your uterus grows outside your uterus.  The lining of your uterus grows in the muscle of your uterus.  Your uterus falls down into your vagina.  You have a growth in your uterus that causes problems.  You have cells that could turn into cancer (precancerous cells).  You have cancer of the uterus or cervix. Types There are 3 types of hysterectomies. Depending on the type, the surgery will:  Remove the top part of the uterus only.  Remove the uterus and the cervix.  Remove the uterus, cervix, and tissue that holds the uterus in place  in the lower belly.  Ways a hysterectomy can be performed There are 5 ways this surgery can be performed.  A cut (incision) is made in the belly (abdomen). The uterus is taken out through the cut.  A cut is made in the vagina. The uterus is taken out through the cut.  Three or four cuts are made in the belly. A surgical device with a camera is put through one of the cuts. The uterus is cut into small pieces. The uterus is taken out through the cuts or the vagina.  Three or four cuts are made in the belly. A surgical device with a camera is put through one of the cuts. The uterus is taken out through the vagina.  Three or four cuts are made in the belly. A surgical device that is  controlled by a computer makes a visual image. The device helps the surgeon control the surgical tools. The uterus is cut into small pieces. The pieces are taken out through the cuts or through the vagina.  What can I expect after the surgery?  You will be given pain medicine.  You will need help at home for 3-5 days after surgery.  You will need to see your doctor in 2-4 weeks after surgery.  You may get hot flashes, have night sweats, and have trouble sleeping.  You may need to have Pap tests in the future if your surgery was related to cancer. Talk to your doctor. It is still good to have regular exams. This information is not intended to replace advice given to you by your health care provider. Make sure you discuss any questions you have with your health care provider. Document Released: 10/14/2011 Document Revised: 12/28/2015 Document Reviewed: 03/29/2013 Elsevier Interactive Patient Education  [employer].

## 2017-10-14 LAB — CBC
HEMATOCRIT: 38.5 % (ref 34.0–46.6)
HEMOGLOBIN: 13.3 g/dL (ref 11.1–15.9)
MCH: 31.1 pg (ref 26.6–33.0)
MCHC: 34.5 g/dL (ref 31.5–35.7)
MCV: 90 fL (ref 79–97)
Platelets: 167 10*3/uL (ref 150–379)
RBC: 4.27 x10E6/uL (ref 3.77–5.28)
RDW: 13.6 % (ref 12.3–15.4)
WBC: 5.8 10*3/uL (ref 3.4–10.8)

## 2017-10-17 ENCOUNTER — Ambulatory Visit (HOSPITAL_COMMUNITY)
Admission: RE | Admit: 2017-10-17 | Discharge: 2017-10-17 | Disposition: A | Discharge status: Home / Self Care | Payer: Self-pay | Source: Ambulatory Visit | Attending: Obstetrics & Gynecology | Admitting: Obstetrics & Gynecology

## 2017-10-17 DIAGNOSIS — D259 Leiomyoma of uterus, unspecified: Secondary | ICD-10-CM | POA: Insufficient documentation

## 2017-10-17 DIAGNOSIS — N939 Abnormal uterine and vaginal bleeding, unspecified: Secondary | ICD-10-CM

## 2017-10-17 DIAGNOSIS — D219 Benign neoplasm of connective and other soft tissue, unspecified: Secondary | ICD-10-CM

## 2017-10-27 ENCOUNTER — Telehealth: Payer: Self-pay | Admitting: General Practice

## 2017-10-27 NOTE — Telephone Encounter (Signed)
Patient called and left message on voicemail line requesting test results. Called patient and her mother answered stating she wasn't at home but would tell her we called. Called cell phone but line was busy.

## 2017-10-28 NOTE — Telephone Encounter (Signed)
Pt hs called and left message twice today for results. Please return her call.

## 2017-10-28 NOTE — Telephone Encounter (Signed)
Attempted to call patient on her personal cell. There was no answer. Left message to call us back.

## 2017-11-03 NOTE — Telephone Encounter (Signed)
Called patient and informed her of ultrasound results. Patient verbalized understanding and asked if she would still be a candidate for UFE. Told patient I don't know but I would send a message to Dr Ihor Dow and she should sign up for mychart and Dr Ihor Dow can reach out to her via mychart. Patient verbalized understanding and states she is still bleeding and will need a refill on her megace. Told patient I will also let Dr Ihor Dow know. Patient states she will need to know if she is having surgery how long she will be out for and if she will be staying in the hospital or not. Told patient I don't know the answer to that as it would depend upon the type of surgery she is having. Told patient I will send a message to Dr Ihor Dow and she will reach out to her. Patient verbalized understanding and had no questions

## 2017-11-04 ENCOUNTER — Encounter: Payer: Self-pay | Admitting: General Practice

## 2017-11-05 ENCOUNTER — Other Ambulatory Visit: Payer: Self-pay | Admitting: General Practice

## 2017-11-05 ENCOUNTER — Ambulatory Visit: Payer: Self-pay | Admitting: Family Medicine

## 2017-11-05 DIAGNOSIS — D259 Leiomyoma of uterus, unspecified: Secondary | ICD-10-CM

## 2017-11-10 ENCOUNTER — Other Ambulatory Visit (HOSPITAL_COMMUNITY): Payer: Self-pay | Admitting: Diagnostic Radiology

## 2017-11-10 DIAGNOSIS — D259 Leiomyoma of uterus, unspecified: Secondary | ICD-10-CM

## 2017-11-25 ENCOUNTER — Encounter: Payer: Self-pay | Admitting: Obstetrics & Gynecology

## 2017-11-26 ENCOUNTER — Other Ambulatory Visit: Payer: Self-pay | Admitting: Obstetrics & Gynecology

## 2017-11-26 DIAGNOSIS — I10 Essential (primary) hypertension: Secondary | ICD-10-CM

## 2017-11-26 MED ORDER — MEGESTROL ACETATE 40 MG PO TABS: 80.0000 mg | ORAL_TABLET | Freq: Two times a day (BID) | ORAL | 0 refills | Status: DC

## 2017-11-29 IMAGING — MR MR PELVIS WO/W CM
6 of 10 series · 19 of 48 positions shown · IV contrast (multihance)
Comparison: CT on 02/13/2016

CLINICAL DATA: Uterine fibroids. Menorrhagia. Evaluation for
uterine artery embolization.

EXAM:
MRI PELVIS WITHOUT AND WITH CONTRAST
TECHNIQUE: Multiplanar multisequence MR imaging of the pelvis was performed
both before and after administration of intravenous contrast.
CONTRAST:  18mL MULTIHANCE GADOBENATE DIMEGLUMINE 529 MG/ML IV SOLN

[Series 3: T2 · coronal · 5.0mm · 0.47mm/px · 3 of 34 slices shown (1 of 3)]
[im 1/34]
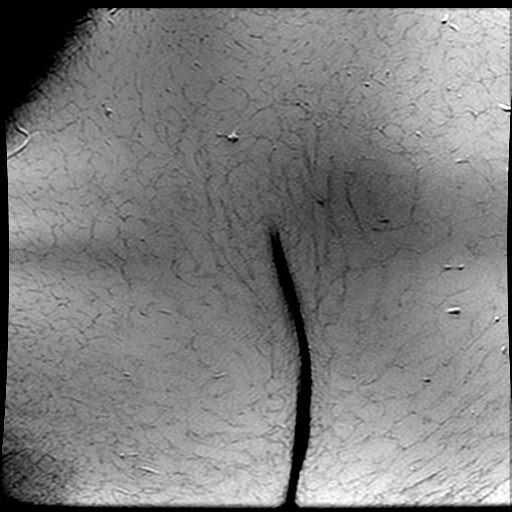
[im 17/34]
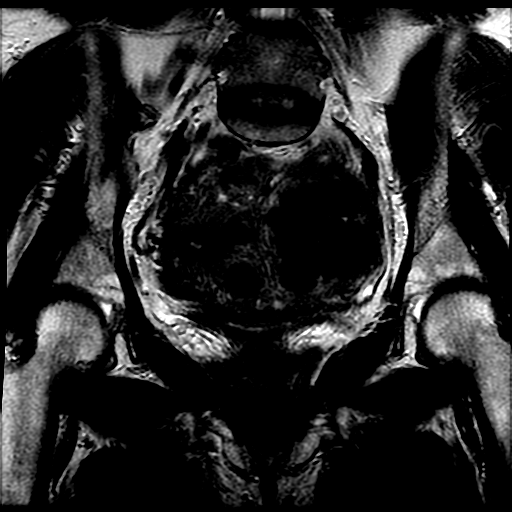
[im 34/34]
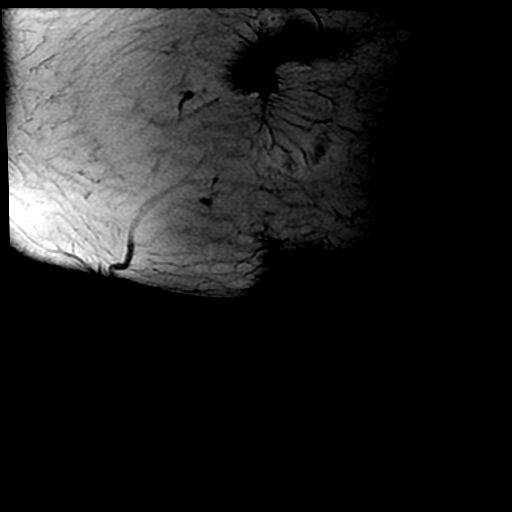

[Series 4: T2 · axial · 5.0mm · 0.47mm/px · z∈[-50,+148]mm · 4 of 34 slices shown (2 of 3)]
[im 1/34]
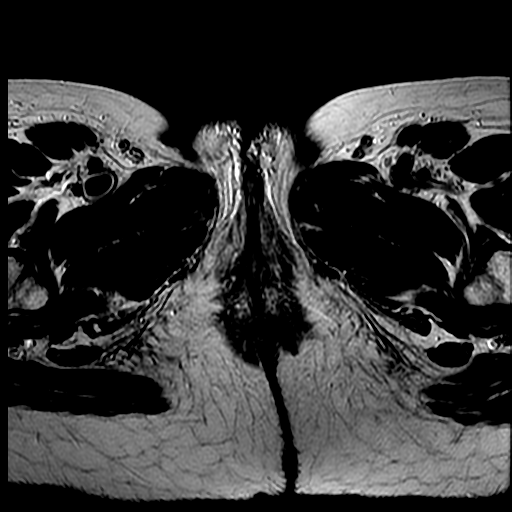
[im 12/34]
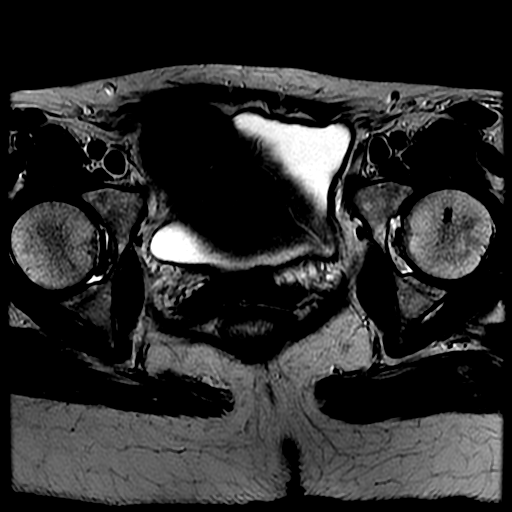
[im 23/34]
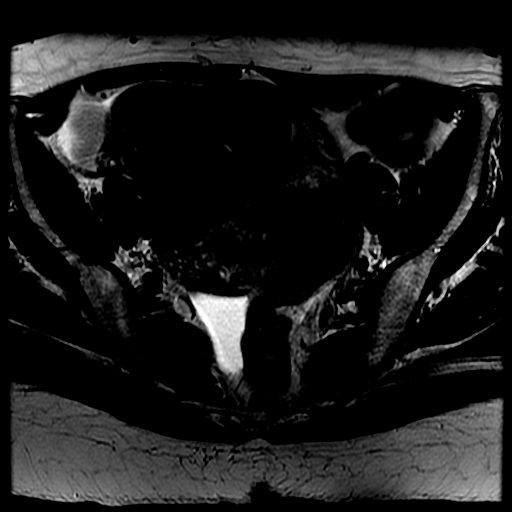
[im 34/34]
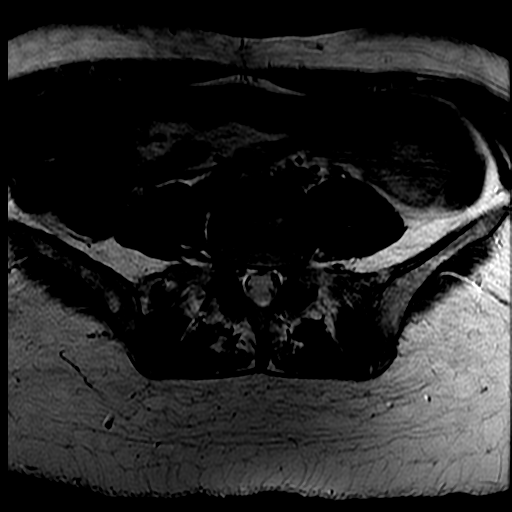

[Series 5: T2 fat-sat · axial · 5.0mm · 0.47mm/px · z∈[-50,+148]mm · 4 of 34 slices shown]
[im 1/34]
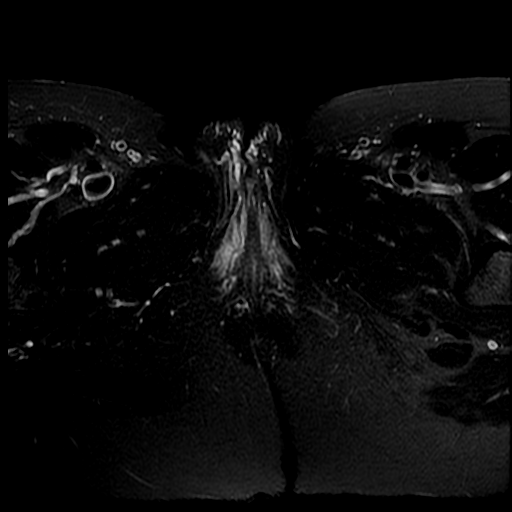
[im 12/34]
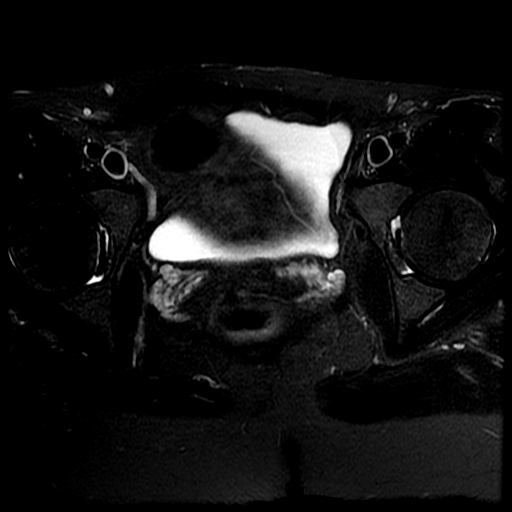
[im 23/34]
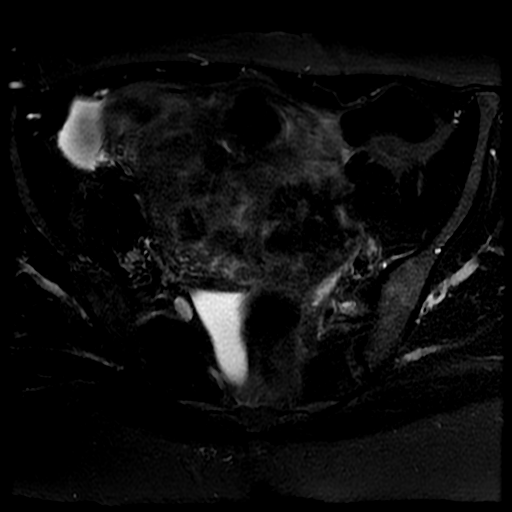
[im 34/34]
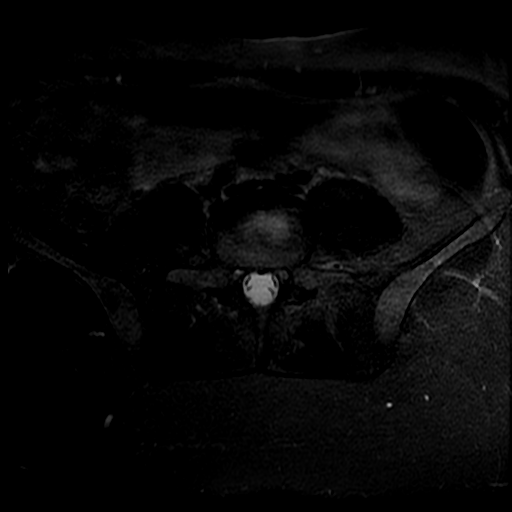

[Series 6: T2 · sagittal · 5.0mm · 0.47mm/px · 3 of 27 slices shown (3 of 3)]
[im 1/27]
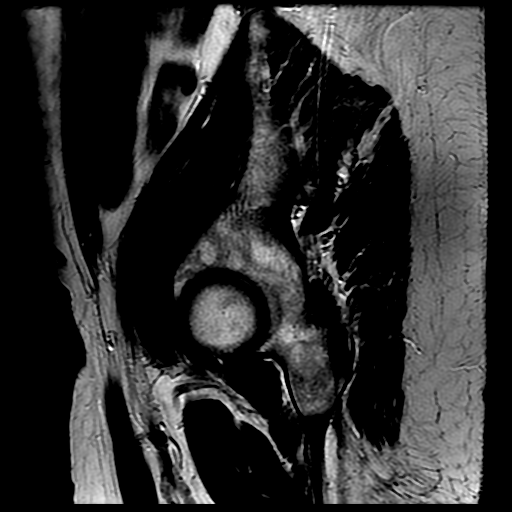
[im 14/27]
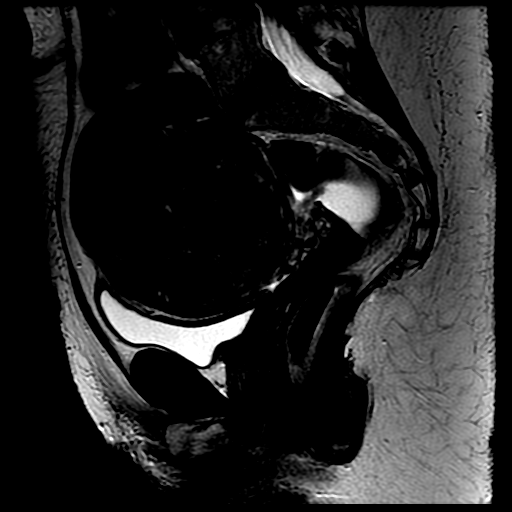
[im 27/27]
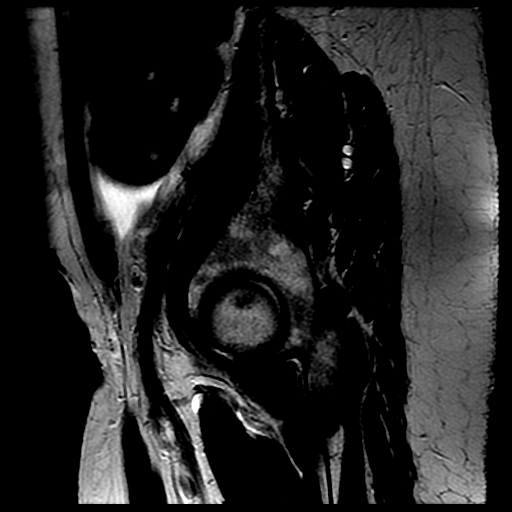

[Series 7: T1 · axial · 5.0mm · 0.47mm/px · z∈[-50,+148]mm · 4 of 34 slices shown]
[im 1/34]
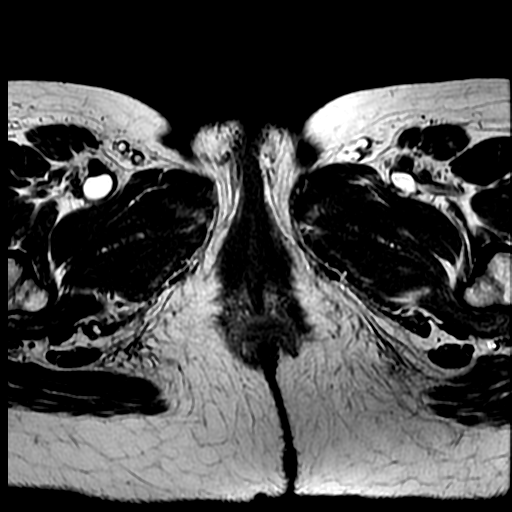
[im 12/34]
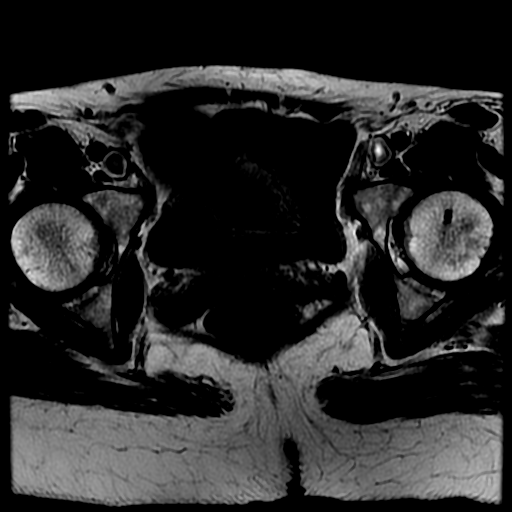
[im 23/34]
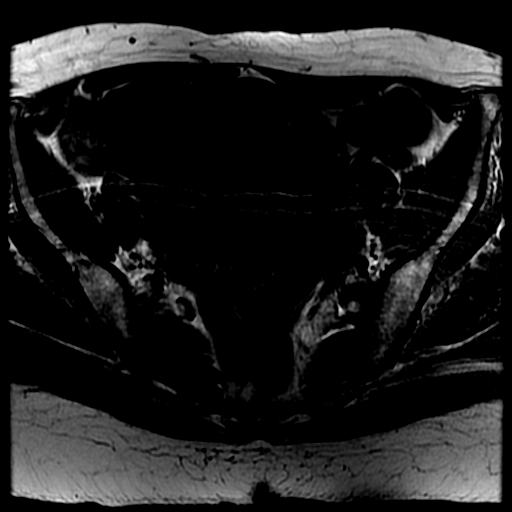
[im 34/34]
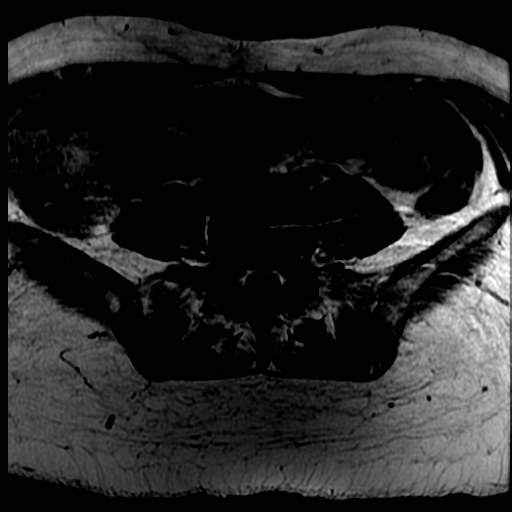

[Series 9: T1 fat-sat post-contrast · sagittal · 5.0mm · 0.47mm/px · 1 of 27 slices shown]
[im 1/27]
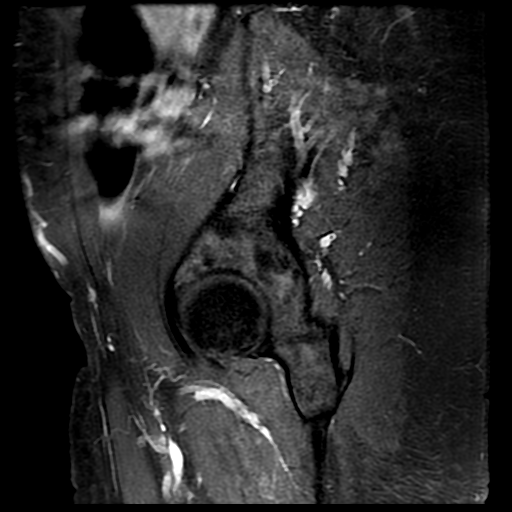

[19 of 48 positions shown; findings below may reference images not displayed]

FINDINGS: Urinary Tract: No bladder abnormality identified.

Bowel:  Unremarkable visualized pelvic bowel loops.

Vascular/Lymphatic: No pathologically enlarged lymph nodes or other
significant abnormality.

Reproductive:

Uterus: Measures 14.6 x 12.2 x 13.1 cm (volume = 6772 cm^3).
Innumerable fibroids are seen which involve the uterus diffusely.
These are submucosal and intramural in location. These range in size
from 1 cm to 7 cm.

Intracavitary fibroids:  None.

Pedunculated fibroids: None.

Fibroid contrast enhancement: Nearly all fibroids show contrast
enhancement, without significant degeneration/devascularization.

Right ovary:  Appears normal.  No mass identified.

Left ovary:  Appears normal.  No mass identified.

Other: Small amount of free fluid in pelvic cul-de-sac. Tiny
fat-containing right inguinal hernia.

Musculoskeletal:  Unremarkable.
IMPRESSION: Diffuse uterine involvement by innumerable submucosal and intramural
fibroids measuring up to 7 cm. No intracavitary or pedunculated
fibroids identified.

Normal appearance of both ovaries.  No adnexal mass.

## 2017-12-01 ENCOUNTER — Other Ambulatory Visit: Payer: Self-pay | Admitting: Radiology

## 2017-12-02 ENCOUNTER — Ambulatory Visit (HOSPITAL_COMMUNITY)
Admission: RE | Admit: 2017-12-02 | Discharge: 2017-12-02 | Disposition: A | Discharge status: Home / Self Care | Payer: Self-pay | Source: Ambulatory Visit | Attending: Diagnostic Radiology | Admitting: Diagnostic Radiology

## 2017-12-02 ENCOUNTER — Encounter (HOSPITAL_COMMUNITY): Payer: Self-pay

## 2017-12-02 ENCOUNTER — Observation Stay (HOSPITAL_COMMUNITY)
Admission: RE | Admit: 2017-12-02 | Discharge: 2017-12-02 | Disposition: A | Discharge status: Home / Self Care | Payer: Self-pay | Source: Ambulatory Visit | Attending: Diagnostic Radiology | Admitting: Diagnostic Radiology

## 2017-12-02 DIAGNOSIS — Z888 Allergy status to other drugs, medicaments and biological substances status: Secondary | ICD-10-CM | POA: Insufficient documentation

## 2017-12-02 DIAGNOSIS — Z539 Procedure and treatment not carried out, unspecified reason: Secondary | ICD-10-CM | POA: Insufficient documentation

## 2017-12-02 DIAGNOSIS — I10 Essential (primary) hypertension: Secondary | ICD-10-CM | POA: Insufficient documentation

## 2017-12-02 DIAGNOSIS — N920 Excessive and frequent menstruation with regular cycle: Secondary | ICD-10-CM | POA: Insufficient documentation

## 2017-12-02 DIAGNOSIS — E079 Disorder of thyroid, unspecified: Secondary | ICD-10-CM | POA: Insufficient documentation

## 2017-12-02 DIAGNOSIS — Z79899 Other long term (current) drug therapy: Secondary | ICD-10-CM | POA: Insufficient documentation

## 2017-12-02 DIAGNOSIS — D259 Leiomyoma of uterus, unspecified: Principal | ICD-10-CM | POA: Insufficient documentation

## 2017-12-02 DIAGNOSIS — Z885 Allergy status to narcotic agent status: Secondary | ICD-10-CM | POA: Insufficient documentation

## 2017-12-02 LAB — CBC WITH DIFFERENTIAL/PLATELET
Basophils Absolute: 0 10*3/uL (ref 0.0–0.1)
Basophils Relative: 0 %
EOS PCT: 6 %
Eosinophils Absolute: 0.3 10*3/uL (ref 0.0–0.7)
HEMATOCRIT: 34.8 % — AB (ref 36.0–46.0)
Hemoglobin: 11.6 g/dL — ABNORMAL LOW (ref 12.0–15.0)
LYMPHS ABS: 1.2 10*3/uL (ref 0.7–4.0)
LYMPHS PCT: 23 %
MCH: 30.4 pg (ref 26.0–34.0)
MCHC: 33.3 g/dL (ref 30.0–36.0)
MCV: 91.3 fL (ref 78.0–100.0)
Monocytes Absolute: 0.3 10*3/uL (ref 0.1–1.0)
Monocytes Relative: 5 %
Neutro Abs: 3.6 10*3/uL (ref 1.7–7.7)
Neutrophils Relative %: 66 %
PLATELETS: 159 10*3/uL (ref 150–400)
RBC: 3.81 MIL/uL — ABNORMAL LOW (ref 3.87–5.11)
RDW: 12.7 % (ref 11.5–15.5)
WBC: 5.4 10*3/uL (ref 4.0–10.5)

## 2017-12-02 LAB — PROTIME-INR
INR: 1.09
Prothrombin Time: 14.1 seconds (ref 11.4–15.2)

## 2017-12-02 LAB — BASIC METABOLIC PANEL
Anion gap: 12 (ref 5–15)
BUN: 11 mg/dL (ref 6–20)
CHLORIDE: 105 mmol/L (ref 101–111)
CO2: 24 mmol/L (ref 22–32)
Calcium: 9.6 mg/dL (ref 8.9–10.3)
Creatinine, Ser: 0.95 mg/dL (ref 0.44–1.00)
GFR calc Af Amer: >60 mL/min (ref >60)
GFR calc non Af Amer: >60 mL/min (ref >60)
GLUCOSE: 86 mg/dL (ref 65–99)
POTASSIUM: 3.1 mmol/L — AB (ref 3.5–5.1)
SODIUM: 141 mmol/L (ref 135–145)

## 2017-12-02 LAB — HCG, SERUM, QUALITATIVE: Preg, Serum: NEGATIVE

## 2017-12-02 MED ORDER — KETOROLAC TROMETHAMINE 30 MG/ML IJ SOLN: 30.0000 mg | INTRAMUSCULAR | Status: DC

## 2017-12-02 MED ORDER — SODIUM CHLORIDE 0.9 % IV SOLN
INTRAVENOUS | Status: DC
  Administered 2017-12-02: 12:00:00 via INTRAVENOUS

## 2017-12-02 MED ORDER — CEFAZOLIN SODIUM-DEXTROSE 2-4 GM/100ML-% IV SOLN: 2.0000 g | INTRAVENOUS | Status: DC

## 2017-12-02 NOTE — Progress Notes (Signed)
Pt procedure cancelled because patient will not have family at home when she is discharged. Pt discharged ambulatory. NAD noted.

## 2017-12-02 NOTE — H&P (Addendum)
Referring Physician(s): Harraway-Smith,C  Supervising Physician: Markus Daft  Patient Status:  [facility] OP TBA  Chief Complaint:  Symptomatic uterine fibroids  Subjective: Patient familiar to IR service from prior consultations with Dr.Henn on 05/28/2016 and again on 03/06/2017 to discuss treatment options for symptomatic uterine fibroids.  She was deemed an appropriate candidate for bilateral uterine artery embolization and presents today for the procedure. She currently denies fever, headache, dyspnea, cough, significant chest pain, nausea, vomiting or dysuria.  She does have pelvic and back discomfort and history of menorrhagia. Past Medical History:  Diagnosis Date  . Anemia   . Hypertension   . Thyroid disease    Past Surgical History:  Procedure Laterality Date  . IR GENERIC HISTORICAL  05/28/2016   IR RADIOLOGIST EVAL & MGMT 05/28/2016 Markus Daft, MD [facility] INTERV RAD  . IR RADIOLOGIST EVAL & MGMT  03/06/2017     Allergies: Hctz [hydrochlorothiazide]; Tramadol; Hydrocodone; and Percocet [oxycodone-acetaminophen]  Medications: Prior to Admission medications   Medication Sig Start Date End Date Taking? Authorizing Provider  acetaminophen (TYLENOL) 325 MG tablet Take 650 mg by mouth every 6 (six) hours as needed for mild pain.   Yes [provider]  amLODipine (NORVASC) 10 MG tablet TAKE 1 TABLET BY MOUTH DAILY. FOR BLOOD PRESSURE. 05/07/17  Yes [name], MD  calcium carbonate (CALCIUM 600) 600 MG TABS tablet Take 1 tablet (600 mg total) by mouth daily with breakfast. 05/31/14  Yes Jegede, Olugbemiga E, MD  cholecalciferol (VITAMIN D) 1000 UNITS tablet Take 1 tablet (1,000 Units total) by mouth daily. 05/31/14  Yes [name], MD  levothyroxine (SYNTHROID, LEVOTHROID) 150 MCG tablet Take 1 tablet (150 mcg total) by mouth daily before breakfast. 05/07/17  Yes Jegede, Olugbemiga E, MD  megestrol (MEGACE) 40 MG tablet Take 2 tablets (80 mg total) by  mouth 2 (two) times daily. 11/26/17  Yes Lavonia Drafts, MD  metoprolol tartrate (LOPRESSOR) 25 MG tablet Take 1 tablet (25 mg total) by mouth 2 (two) times daily. 10/08/17  Yes [name], MD  Multiple Vitamin (MULTIVITAMIN WITH MINERALS) TABS tablet Take 1 tablet by mouth daily. 05/31/14  Yes [name], MD  benzonatate (TESSALON) 100 MG capsule Take 1 capsule (100 mg total) by mouth every 8 (eight) hours. Patient not taking: Reported on 10/13/2017 07/18/17   Jacqlyn Larsen, PA-C  ibuprofen (ADVIL,MOTRIN) 600 MG tablet Take 1 tablet (600 mg total) by mouth every 6 (six) hours as needed. Patient taking differently: Take 600 mg by mouth every 6 (six) hours as needed for moderate pain.  11/10/15   Ashley Murrain, NP     Vital Signs: BP (!) 150/105   Pulse 85   Temp 98.3 F (36.8 C) (Oral)   Resp 18   SpO2 100%   Physical Exam awake, alert.  Chest clear to auscultation bilaterally.  Heart with regular rate and rhythm.  Abdomen soft, positive bowel sounds, fibroid uterus, anterior pelvic tenderness to palpation; no lower extremity edema, intact distal pulses.  Imaging: No results found.  Labs:  CBC: Recent Labs    05/07/17 1120 10/13/17 1346 12/02/17 1152  WBC 2.8* 5.8 5.4  HGB 12.9 13.3 11.6*  HCT 36.9 38.5 34.8*  PLT 112* 167 159    COAGS: Recent Labs    12/02/17 1152  INR 1.09    BMP: Recent Labs    02/24/17 1514 05/07/17 1120 12/02/17 1152  NA  --  139 141  K  --  3.9 3.1*  CL  --  102 105  CO2  --  22 24  GLUCOSE  --  78 86  BUN  --  11 11  CALCIUM  --  9.3 9.6  CREATININE 1.00 0.85 0.95  GFRNONAA  --  81 >60  GFRAA  --  93 >60    LIVER FUNCTION TESTS: Recent Labs    05/07/17 1120  BILITOT 0.5  AST 18  ALT 22  ALKPHOS 40  PROT 6.8  ALBUMIN 4.4    Assessment and Plan: Patient with history of symptomatic uterine fibroids; seen previously in consultation by Dr. Anselm Pancoast and deemed an appropriate candidate for bilateral  uterine artery embolization.  She presents today for the procedure.Risks and benefits of procedure were discussed with the patient including, but not limited to bleeding, infection, vascular injury or contrast induced renal failure.  This interventional procedure involves the use of X-rays and because of the nature of the planned procedure, it is possible that we will have prolonged use of X-ray fluoroscopy.  Potential radiation risks to you include (but are not limited to) the following: - A slightly elevated risk for cancer  several years later in life. This risk is typically less than 0.5% percent. This risk is low in comparison to the normal incidence of human cancer, which is 33% for women and 50% for men according to the [employer]. - Radiation induced injury can include skin redness, resembling a rash, tissue breakdown / ulcers and hair loss (which can be temporary or permanent).   The likelihood of either of these occurring depends on the difficulty of the procedure and whether you are sensitive to radiation due to previous procedures, disease, or genetic conditions.   IF your procedure requires a prolonged use of radiation, you will be notified and given written instructions for further action.  It is your responsibility to monitor the irradiated area for the 2 weeks following the procedure and to notify your physician if you are concerned that you have suffered a radiation induced injury.    All of the patient's questions were answered.    Pt states that she lives with her mother and would not have responsible party at home following procedure to provide assistance with her care if needed postop. She also does not have arrangements made for transport home after procedure. She has decided to postpone UFE for now .    Electronically Signed: D. Rowe Robert, PA-C 12/02/2017, 12:47 PM   I spent a total of 30 minutes at the the patient's bedside AND on the patient's  hospital floor or unit, greater than 50% of which was counseling/coordinating care for bilateral uterine artery embolization

## 2017-12-03 ENCOUNTER — Encounter: Payer: Self-pay | Admitting: Radiology

## 2017-12-10 ENCOUNTER — Encounter: Payer: Self-pay | Admitting: Family Medicine

## 2017-12-10 ENCOUNTER — Other Ambulatory Visit: Payer: Self-pay | Admitting: Obstetrics & Gynecology

## 2017-12-10 ENCOUNTER — Ambulatory Visit: Payer: Self-pay | Attending: Family Medicine | Admitting: Family Medicine

## 2017-12-10 VITALS — BP 144/90 | HR 88 | Temp 98.4°F | Ht 68.0 in | Wt 204.8 lb

## 2017-12-10 DIAGNOSIS — Z79890 Hormone replacement therapy (postmenopausal): Secondary | ICD-10-CM | POA: Insufficient documentation

## 2017-12-10 DIAGNOSIS — E038 Other specified hypothyroidism: Secondary | ICD-10-CM | POA: Insufficient documentation

## 2017-12-10 DIAGNOSIS — Z79899 Other long term (current) drug therapy: Secondary | ICD-10-CM | POA: Insufficient documentation

## 2017-12-10 DIAGNOSIS — I10 Essential (primary) hypertension: Secondary | ICD-10-CM

## 2017-12-10 DIAGNOSIS — D259 Leiomyoma of uterus, unspecified: Secondary | ICD-10-CM | POA: Insufficient documentation

## 2017-12-10 MED ORDER — AMLODIPINE BESYLATE 10 MG PO TABS: ORAL_TABLET | ORAL | 6 refills | Status: DC

## 2017-12-10 MED ORDER — METOPROLOL TARTRATE 25 MG PO TABS: 25.0000 mg | ORAL_TABLET | Freq: Two times a day (BID) | ORAL | 6 refills | Status: DC

## 2017-12-10 MED ORDER — IBUPROFEN 600 MG PO TABS: 600.0000 mg | ORAL_TABLET | Freq: Three times a day (TID) | ORAL | 1 refills | Status: DC | PRN

## 2017-12-10 NOTE — Progress Notes (Signed)
Subjective:  Patient ID: Heidi Barrett, female    DOB: 1966/07/31  Age: 52 y.o. MRN: [id number]  CC: Hypertension   HPI Heidi Barrett is a 52 year old female with a history of hypertension, hypothyroidism, menorrhagia secondary to uterine fibroids who presents today to establish care with me as she was Heidi Beets, FNP who is no longer with the practice. She endorses compliance with her antihypertensives and is requesting a refill today. She was previously scheduled for uterine fibroid surgery which was postponed to the summer due to her ongoing menstrual bleed and she was last seen by GYN in 11/2016 and placed on Megace the dose of which was increased from 40 mg daily to 40 mg twice daily due to her ongoing bleeding and she will be needing a refill. She has no additional concerns today.  Past Medical History:  Diagnosis Date  . Anemia   . Hypertension   . Thyroid disease      Outpatient Medications Prior to Visit  Medication Sig Dispense Refill  . acetaminophen (TYLENOL) 325 MG tablet Take 650 mg by mouth every 6 (six) hours as needed for mild pain.    . calcium carbonate (CALCIUM 600) 600 MG TABS tablet Take 1 tablet (600 mg total) by mouth daily with breakfast. 60 tablet 3  . cholecalciferol (VITAMIN D) 1000 UNITS tablet Take 1 tablet (1,000 Units total) by mouth daily. 90 tablet 3  . levothyroxine (SYNTHROID, LEVOTHROID) 150 MCG tablet Take 1 tablet (150 mcg total) by mouth daily before breakfast. 90 tablet 3  . megestrol (MEGACE) 40 MG tablet Take 2 tablets (80 mg total) by mouth 2 (two) times daily. 60 tablet 0  . Multiple Vitamin (MULTIVITAMIN WITH MINERALS) TABS tablet Take 1 tablet by mouth daily. 90 tablet 3  . amLODipine (NORVASC) 10 MG tablet TAKE 1 TABLET BY MOUTH DAILY. FOR BLOOD PRESSURE. 90 tablet 3  . ibuprofen (ADVIL,MOTRIN) 600 MG tablet Take 1 tablet (600 mg total) by mouth every 6 (six) hours as needed. (Patient taking differently: Take 600 mg by mouth  every 6 (six) hours as needed for moderate pain. ) 30 tablet 0  . metoprolol tartrate (LOPRESSOR) 25 MG tablet Take 1 tablet (25 mg total) by mouth 2 (two) times daily. 60 tablet 2  . benzonatate (TESSALON) 100 MG capsule Take 1 capsule (100 mg total) by mouth every 8 (eight) hours. (Patient not taking: Reported on 10/13/2017) 21 capsule 0   No facility-administered medications prior to visit.     ROS Review of Systems  Constitutional: Negative for activity change, appetite change and fatigue.  HENT: Negative for congestion, sinus pressure and sore throat.   Eyes: Negative for visual disturbance.  Respiratory: Negative for cough, chest tightness, shortness of breath and wheezing.   Cardiovascular: Negative for chest pain and palpitations.  Gastrointestinal: Negative for abdominal distention, abdominal pain and constipation.  Endocrine: Negative for polydipsia.  Genitourinary: Positive for menstrual problem. Negative for dysuria and frequency.  Musculoskeletal: Negative for arthralgias and back pain.  Skin: Negative for rash.  Neurological: Negative for tremors, light-headedness and numbness.  Hematological: Does not bruise/bleed easily.  Psychiatric/Behavioral: Negative for agitation and behavioral problems.    Objective:  BP (!) 144/90   Pulse 88   Temp 98.4 F (36.9 C) (Oral)   Ht 5' 8"  (1.727 m)   Wt 204 lb 12.8 oz (92.9 kg)   SpO2 99%   BMI 31.14 kg/m   BP/Weight 12/10/2017 12/02/2017 8/58/8502  Systolic BP 774 128 786  Diastolic BP 90 401 95  Wt. (Lbs) 204.8 - 201.1  BMI 31.14 - 30.58      Physical Exam  Constitutional: She is oriented to person, place, and time. She appears well-developed and well-nourished.  Cardiovascular: Normal rate, normal heart sounds and intact distal pulses.  No murmur heard. Pulmonary/Chest: Effort normal and breath sounds normal. She has no wheezes. She has no rales. She exhibits no tenderness.  Abdominal: Soft. Bowel sounds are normal.  She exhibits mass (slightly tender lower abdominal mass). She exhibits no distension. There is no tenderness.  Musculoskeletal: Normal range of motion.  Neurological: She is alert and oriented to person, place, and time.  Skin: Skin is warm and dry.  Psychiatric: She has a normal mood and affect.    CMP Latest Ref Rng & Units 12/02/2017 05/07/2017 02/24/2017  Glucose 65 - 99 mg/dL 86 78 -  BUN 6 - 20 mg/dL 11 11 -  Creatinine 0.44 - 1.00 mg/dL 0.95 0.85 1.00  Sodium 135 - 145 mmol/L 141 139 -  Potassium 3.5 - 5.1 mmol/L 3.1(L) 3.9 -  Chloride 101 - 111 mmol/L 105 102 -  CO2 22 - 32 mmol/L 24 22 -  Calcium 8.9 - 10.3 mg/dL 9.6 9.3 -  Total Protein 6.0 - 8.5 g/dL - 6.8 -  Total Bilirubin 0.0 - 1.2 mg/dL - 0.5 -  Alkaline Phos 39 - 117 IU/L - 40 -  AST 0 - 40 IU/L - 18 -  ALT 0 - 32 IU/L - 22 -    CBC    Component Value Date/Time   WBC 5.4 12/02/2017 1152   RBC 3.81 (L) 12/02/2017 1152   HGB 11.6 (L) 12/02/2017 1152   HGB 13.3 10/13/2017 1346   HCT 34.8 (L) 12/02/2017 1152   HCT 38.5 10/13/2017 1346   PLT 159 12/02/2017 1152   PLT 167 10/13/2017 1346   MCV 91.3 12/02/2017 1152   MCV 90 10/13/2017 1346   MCH 30.4 12/02/2017 1152   MCHC 33.3 12/02/2017 1152   RDW 12.7 12/02/2017 1152   RDW 13.6 10/13/2017 1346   LYMPHSABS 1.2 12/02/2017 1152   LYMPHSABS 1.1 05/07/2017 1120   MONOABS 0.3 12/02/2017 1152   EOSABS 0.3 12/02/2017 1152   EOSABS 0.2 05/07/2017 1120   BASOSABS 0.0 12/02/2017 1152   BASOSABS 0.0 05/07/2017 1120    Lipid Panel     Component Value Date/Time   CHOL 194 05/07/2017 1120   TRIG 73 05/07/2017 1120   HDL 68 05/07/2017 1120   CHOLHDL 2.9 05/07/2017 1120   CHOLHDL 2.6 01/16/2015 1041   VLDL 9 01/16/2015 1041   LDLCALC 111 (H) 05/07/2017 1120    Assessment & Plan:   1. Essential hypertension, benign Slightly elevated No regimen change today Counseled on blood pressure goal of less than 130/80, low-sodium, DASH diet, medication compliance, 150  minutes of moderate intensity exercise per week. Discussed medication compliance, adverse effects. - CMP14+EGFR; Future - Lipid panel; Future - metoprolol tartrate (LOPRESSOR) 25 MG tablet; Take 1 tablet (25 mg total) by mouth 2 (two) times daily.  Dispense: 60 tablet; Refill: 6 - amLODipine (NORVASC) 10 MG tablet; TAKE 1 TABLET BY MOUTH DAILY. FOR BLOOD PRESSURE.  Dispense: 30 tablet; Refill: 6  2. Uterine leiomyoma, unspecified location Scheduled for uterine surgery Continue Megace which is being prescribed by GYN-she knows to call the GYN office to obtain refills - ibuprofen (ADVIL,MOTRIN) 600 MG tablet; Take 1 tablet (600 mg total) by mouth every 8 (eight) hours  as needed.  Dispense: 60 tablet; Refill: 1  3. Other specified hypothyroidism TSH today and I will adjust her regimen accordingly - TSH; Future - T4, free; Future   Meds ordered this encounter  Medications  . metoprolol tartrate (LOPRESSOR) 25 MG tablet    Sig: Take 1 tablet (25 mg total) by mouth 2 (two) times daily.    Dispense:  60 tablet    Refill:  6  . amLODipine (NORVASC) 10 MG tablet    Sig: TAKE 1 TABLET BY MOUTH DAILY. FOR BLOOD PRESSURE.    Dispense:  30 tablet    Refill:  6  . ibuprofen (ADVIL,MOTRIN) 600 MG tablet    Sig: Take 1 tablet (600 mg total) by mouth every 8 (eight) hours as needed.    Dispense:  60 tablet    Refill:  1    Follow-up: Return in about 3 months (around 03/12/2018) for follow up of chronic medical conditions.   Charlott Rakes MD

## 2017-12-11 ENCOUNTER — Other Ambulatory Visit: Payer: Self-pay | Admitting: Obstetrics & Gynecology

## 2017-12-11 ENCOUNTER — Ambulatory Visit: Payer: Self-pay | Attending: Internal Medicine

## 2017-12-11 ENCOUNTER — Encounter: Payer: Self-pay | Admitting: Family Medicine

## 2017-12-11 DIAGNOSIS — I10 Essential (primary) hypertension: Secondary | ICD-10-CM | POA: Insufficient documentation

## 2017-12-11 DIAGNOSIS — E038 Other specified hypothyroidism: Secondary | ICD-10-CM | POA: Insufficient documentation

## 2017-12-11 NOTE — Progress Notes (Signed)
Patient here for lab visit  

## 2017-12-12 ENCOUNTER — Other Ambulatory Visit: Payer: Self-pay | Admitting: Family Medicine

## 2017-12-12 DIAGNOSIS — E038 Other specified hypothyroidism: Secondary | ICD-10-CM

## 2017-12-12 LAB — CMP14+EGFR
ALT: 16 IU/L (ref 0–32)
AST: 12 IU/L (ref 0–40)
Albumin/Globulin Ratio: 1.4 (ref 1.2–2.2)
Albumin: 3.8 g/dL (ref 3.5–5.5)
Alkaline Phosphatase: 48 IU/L (ref 39–117)
BUN/Creatinine Ratio: 12 (ref 9–23)
BUN: 12 mg/dL (ref 6–24)
Bilirubin Total: 0.3 mg/dL (ref 0.0–1.2)
CALCIUM: 9.2 mg/dL (ref 8.7–10.2)
CO2: 23 mmol/L (ref 20–29)
CREATININE: 1.02 mg/dL — AB (ref 0.57–1.00)
Chloride: 105 mmol/L (ref 96–106)
GFR, EST AFRICAN AMERICAN: 75 mL/min/{1.73_m2} (ref >59)
GFR, EST NON AFRICAN AMERICAN: 65 mL/min/{1.73_m2} (ref >59)
GLOBULIN, TOTAL: 2.8 g/dL (ref 1.5–4.5)
Glucose: 76 mg/dL (ref 65–99)
Potassium: 3.6 mmol/L (ref 3.5–5.2)
Sodium: 143 mmol/L (ref 134–144)
TOTAL PROTEIN: 6.6 g/dL (ref 6.0–8.5)

## 2017-12-12 LAB — TSH: TSH: 21.55 u[IU]/mL — ABNORMAL HIGH (ref 0.450–4.500)

## 2017-12-12 LAB — LIPID PANEL
CHOLESTEROL TOTAL: 160 mg/dL (ref 100–199)
Chol/HDL Ratio: 3.5 ratio (ref 0.0–4.4)
HDL: 46 mg/dL (ref >39)
LDL Calculated: 102 mg/dL — ABNORMAL HIGH (ref 0–99)
Triglycerides: 58 mg/dL (ref 0–149)
VLDL Cholesterol Cal: 12 mg/dL (ref 5–40)

## 2017-12-12 LAB — T4, FREE: FREE T4: 1.27 ng/dL (ref 0.82–1.77)

## 2017-12-12 MED ORDER — LEVOTHYROXINE SODIUM 175 MCG PO TABS: 175.0000 ug | ORAL_TABLET | Freq: Every day | ORAL | 3 refills | Status: DC

## 2017-12-15 ENCOUNTER — Other Ambulatory Visit: Payer: Self-pay | Admitting: Obstetrics & Gynecology

## 2017-12-15 DIAGNOSIS — D219 Benign neoplasm of connective and other soft tissue, unspecified: Secondary | ICD-10-CM

## 2017-12-15 DIAGNOSIS — I10 Essential (primary) hypertension: Secondary | ICD-10-CM

## 2017-12-15 DIAGNOSIS — N939 Abnormal uterine and vaginal bleeding, unspecified: Secondary | ICD-10-CM

## 2017-12-15 MED ORDER — MEGESTROL ACETATE 40 MG PO TABS: 40.0000 mg | ORAL_TABLET | Freq: Two times a day (BID) | ORAL | 3 refills | Status: DC

## 2017-12-31 ENCOUNTER — Telehealth: Payer: Self-pay | Admitting: General Practice

## 2017-12-31 DIAGNOSIS — D219 Benign neoplasm of connective and other soft tissue, unspecified: Secondary | ICD-10-CM

## 2017-12-31 DIAGNOSIS — N939 Abnormal uterine and vaginal bleeding, unspecified: Secondary | ICD-10-CM

## 2017-12-31 DIAGNOSIS — I10 Essential (primary) hypertension: Secondary | ICD-10-CM

## 2017-12-31 NOTE — Telephone Encounter (Signed)
Patient called and left message on nurse voicemail line stating she needs a refill on her Megace. Patient states the pharmacy doesn't have the correct dose. She is taking 80mg  BID and the pharmacy is telling her it's too soon for a refill because they do not have the right dose.  Called Dr Ihor Dow to clarify- patient should be taking 40mg  BID.  Called patient & discussed dosing instructions with her. Patient verbalized understanding and states we will need to call the pharmacy to update them because they will not give her a new prescription. Told patient I would call her pharmacy. Patient verbalized understanding & had no questions. Called [facility] pharmacy and they will get Rx ready for tomorrow.

## 2018-02-17 ENCOUNTER — Ambulatory Visit
Admission: RE | Admit: 2018-02-17 | Discharge: 2018-02-17 | Disposition: A | Discharge status: Home / Self Care | Payer: Self-pay | Source: Ambulatory Visit | Attending: Obstetrics & Gynecology | Admitting: Obstetrics & Gynecology

## 2018-02-17 DIAGNOSIS — D259 Leiomyoma of uterus, unspecified: Secondary | ICD-10-CM

## 2018-02-17 HISTORY — PX: IR RADIOLOGIST EVAL & MGMT: IMG5224

## 2018-02-17 NOTE — Progress Notes (Signed)
Chief Complaint: Patient was seen in consultation today for  Chief Complaint  Patient presents with  . Follow-up    to review MR pre [place]   at the request of Harraway-Smith,Carolyn  Referring Physician(s): Harraway-Smith,Carolyn [name], [name]  History of Present Illness: Heidi Barrett is a 52 y.o. female with uterine fibroids and menorrhagia.  Patient was deemed a candidate for uterine artery embolization based on prior visits and MRI but patient's follow-up has been intermittent.  In addition, the patient has been indecisive about whether or not she wants treatment or not.  In April 2019, we were going to perform the procedure but it was canceled because the patient did not have adequate support at home to help her and her mother following the procedure.  Patient presents for re-evaluation and discussion of the uterine artery embolization procedure.  Currently, the patient says that she is feeling very good.  She does not have any abdominal or pelvic pain at this time.  Her last menstrual period was 1 week ago.  The bleeding lasted 1 week but the flow was very light.  At this time, she does not complain of any significant symptoms associated with her uterine fibroids.  Patient is still taking Megace.  Past medical history is significant for hypertension.   Past Medical History:  Diagnosis Date  . Anemia   . Hypertension   . Thyroid disease     Past Surgical History:  Procedure Laterality Date  . IR GENERIC HISTORICAL  05/28/2016   IR RADIOLOGIST EVAL & MGMT 05/28/2016 Markus Daft, MD [facility] INTERV RAD  . IR RADIOLOGIST EVAL & MGMT  03/06/2017    Allergies: Hctz [hydrochlorothiazide]; Tramadol; Hydrocodone; and Percocet [oxycodone-acetaminophen]  Medications: Prior to Admission medications   Medication Sig Start Date End Date Taking? Authorizing Provider  acetaminophen (TYLENOL) 325 MG tablet Take 650 mg by mouth every 6 (six) hours as needed for mild pain.   Yes  [provider]  amLODipine (NORVASC) 10 MG tablet TAKE 1 TABLET BY MOUTH DAILY. FOR BLOOD PRESSURE. 12/10/17  Yes Charlott Rakes, MD  calcium carbonate (CALCIUM 600) 600 MG TABS tablet Take 1 tablet (600 mg total) by mouth daily with breakfast. 05/31/14  Yes Jegede, Olugbemiga E, MD  cholecalciferol (VITAMIN D) 1000 UNITS tablet Take 1 tablet (1,000 Units total) by mouth daily. 05/31/14  Yes [name], MD  ibuprofen (ADVIL,MOTRIN) 600 MG tablet Take 1 tablet (600 mg total) by mouth every 8 (eight) hours as needed. 12/10/17  Yes Charlott Rakes, MD  levothyroxine (SYNTHROID, LEVOTHROID) 175 MCG tablet Take 1 tablet (175 mcg total) by mouth daily before breakfast. 12/12/17  Yes [name], [name], MD  megestrol (MEGACE) 40 MG tablet Take 1 tablet (40 mg total) by mouth 2 (two) times daily. 12/15/17  Yes Lavonia Drafts, MD  metoprolol tartrate (LOPRESSOR) 25 MG tablet Take 1 tablet (25 mg total) by mouth 2 (two) times daily. 12/10/17  Yes Charlott Rakes, MD  Multiple Vitamin (MULTIVITAMIN WITH MINERALS) TABS tablet Take 1 tablet by mouth daily. 05/31/14  Yes [name], MD  benzonatate (TESSALON) 100 MG capsule Take 1 capsule (100 mg total) by mouth every 8 (eight) hours. Patient not taking: Reported on 10/13/2017 07/18/17   Jacqlyn Larsen, PA-C     Family History  Problem Relation Age of Onset  . Heart disease Mother   . Stroke Mother   . Cancer Father        prostate    Social History  Socioeconomic History  . Marital status: Single    Spouse name: Not on file  . Number of children: Not on file  . Years of education: Not on file  . Highest education level: Not on file  Occupational History  . Not on file  Social Needs  . Financial resource strain: Not on file  . Food insecurity:    Worry: Not on file    Inability: Not on file  . Transportation needs:    Medical: Not on file    Non-medical: Not on file  Tobacco Use  . Smoking status: Never Smoker    . Smokeless tobacco: Never Used  Substance and Sexual Activity  . Alcohol use: No  . Drug use: No  . Sexual activity: Never    Birth control/protection: None  Lifestyle  . Physical activity:    Days per week: Not on file    Minutes per session: Not on file  . Stress: Not on file  Relationships  . Social connections:    Talks on phone: Not on file    Gets together: Not on file    Attends religious service: Not on file    Active member of club or organization: Not on file    Attends meetings of clubs or organizations: Not on file    Relationship status: Not on file  Other Topics Concern  . Not on file  Social History Narrative  . Not on file       Review of Systems  Genitourinary: Positive for menstrual problem. Negative for pelvic pain.    Vital Signs: BP (!) 142/92   Pulse 81   Temp 98.4 F (36.9 C) (Oral)   Resp 15   Ht 5\' 8"  (1.727 m)   Wt 201 lb (91.2 kg)   LMP 02/13/2018   SpO2 100%   BMI 30.56 kg/m   Physical Exam  Constitutional: She appears well-developed. No distress.  Neurological: She is alert.       Imaging: No results found.  Labs:  CBC: Recent Labs    05/07/17 1120 10/13/17 1346 12/02/17 1152  WBC 2.8* 5.8 5.4  HGB 12.9 13.3 11.6*  HCT 36.9 38.5 34.8*  PLT 112* 167 159    COAGS: Recent Labs    12/02/17 1152  INR 1.09    BMP: Recent Labs    02/24/17 1514 05/07/17 1120 12/02/17 1152 12/11/17 1019  NA  --  139 141 143  K  --  3.9 3.1* 3.6  CL  --  102 105 105  CO2  --  22 24 23   GLUCOSE  --  78 86 76  BUN  --  11 11 12   CALCIUM  --  9.3 9.6 9.2  CREATININE 1.00 0.85 0.95 1.02*  GFRNONAA  --  81 >60 65  GFRAA  --  93 >60 75    LIVER FUNCTION TESTS: Recent Labs    05/07/17 1120 12/11/17 1019  BILITOT 0.5 0.3  AST 18 12  ALT 22 16  ALKPHOS 40 48  PROT 6.8 6.6  ALBUMIN 4.4 3.8    TUMOR MARKERS: No results for input(s): AFPTM, CEA, CA199, CHROMGRNA in the last 8760 hours.  Assessment and  Plan:  52 year old with uterine fibroids and history of menorrhagia.  Currently, the patient has no significant symptoms associated with her fibroids.  She feels like she may be premenopausal because she is experiencing temperature changes and says that the symptoms are similar to what her mother had a similar  age.  After a long discussion, we decided there was no reason to proceed with a uterine artery embolization procedure at this time.  I recommend that she follow-up with her gynecologist in order to manage her medications.  If her symptoms change and she would like to consider uterine artery embolization procedure again, then we should repeat the MRI because it is approximately 50 year old.  At this time, the patient will follow-up with Gynecology in near future and follow-up with Interventional Radiology as needed.  No plans for uterine artery embolization procedure at this time.   Electronically Signed: Burman Riis 02/17/2018, 12:55 PM   I spent a total of    15 Minutes in face to face in clinical consultation, greater than 50% of which was counseling/coordinating care for uterine fibroids.  Patient ID: Heidi Barrett, female   DOB: 1965-09-24, 52 y.o.   MRN: [id number]

## 2018-02-18 ENCOUNTER — Encounter: Payer: Self-pay | Admitting: *Deleted

## 2018-03-02 ENCOUNTER — Encounter: Payer: Self-pay | Admitting: Obstetrics & Gynecology

## 2018-03-02 ENCOUNTER — Ambulatory Visit: Payer: Self-pay

## 2018-03-04 ENCOUNTER — Telehealth: Payer: Self-pay | Admitting: Family Medicine

## 2018-03-04 NOTE — Telephone Encounter (Signed)
I called Pt since she has an appt with the financial dept ay [place] on 03/06/18, but she has a note that is potential medicaid, she need to contact Mortimer Fries at [phone]  To check on the application..please inform th3 Pt.Marland KitchenMarland Kitchen

## 2018-03-05 ENCOUNTER — Encounter: Payer: Self-pay | Admitting: Radiology

## 2018-03-06 ENCOUNTER — Ambulatory Visit: Payer: Self-pay

## 2018-03-12 ENCOUNTER — Ambulatory Visit: Payer: Self-pay | Admitting: Family Medicine

## 2018-03-30 ENCOUNTER — Ambulatory Visit: Payer: Self-pay | Admitting: Family Medicine

## 2018-03-30 ENCOUNTER — Other Ambulatory Visit: Payer: Self-pay | Admitting: Family Medicine

## 2018-03-30 DIAGNOSIS — E038 Other specified hypothyroidism: Secondary | ICD-10-CM

## 2018-05-18 ENCOUNTER — Ambulatory Visit: Payer: Self-pay | Attending: Family Medicine | Admitting: Family Medicine

## 2018-05-18 ENCOUNTER — Encounter: Payer: Self-pay | Admitting: Family Medicine

## 2018-05-18 VITALS — BP 132/80 | HR 81 | Temp 98.0°F | Ht 68.0 in | Wt 208.0 lb

## 2018-05-18 DIAGNOSIS — N920 Excessive and frequent menstruation with regular cycle: Secondary | ICD-10-CM | POA: Insufficient documentation

## 2018-05-18 DIAGNOSIS — D259 Leiomyoma of uterus, unspecified: Secondary | ICD-10-CM | POA: Insufficient documentation

## 2018-05-18 DIAGNOSIS — Z79890 Hormone replacement therapy (postmenopausal): Secondary | ICD-10-CM | POA: Insufficient documentation

## 2018-05-18 DIAGNOSIS — Z114 Encounter for screening for human immunodeficiency virus [HIV]: Secondary | ICD-10-CM

## 2018-05-18 DIAGNOSIS — D219 Benign neoplasm of connective and other soft tissue, unspecified: Secondary | ICD-10-CM

## 2018-05-18 DIAGNOSIS — E038 Other specified hypothyroidism: Secondary | ICD-10-CM | POA: Insufficient documentation

## 2018-05-18 DIAGNOSIS — Z79899 Other long term (current) drug therapy: Secondary | ICD-10-CM | POA: Insufficient documentation

## 2018-05-18 DIAGNOSIS — I10 Essential (primary) hypertension: Secondary | ICD-10-CM | POA: Insufficient documentation

## 2018-05-18 DIAGNOSIS — Z1211 Encounter for screening for malignant neoplasm of colon: Secondary | ICD-10-CM

## 2018-05-18 MED ORDER — METOPROLOL TARTRATE 25 MG PO TABS: 25.0000 mg | ORAL_TABLET | Freq: Two times a day (BID) | ORAL | 6 refills | Status: DC

## 2018-05-18 MED ORDER — AMLODIPINE BESYLATE 10 MG PO TABS: ORAL_TABLET | ORAL | 6 refills | Status: DC

## 2018-05-18 NOTE — Patient Instructions (Signed)

## 2018-05-18 NOTE — Progress Notes (Signed)
Subjective:  Patient ID: Heidi Barrett, female    DOB: [birth year]  Age: 52 y.o. MRN: [id number]  CC: Hypertension and Hypothyroidism   HPI Heidi Barrett is a 52 year old female with a history of hypertension, menorrhagia secondary to fibroids, hypothyroidism who presents today for follow-up visit. Her menorrhagia has resolved and all she has is brownish discharge; currently does not take her Megace and her planned uterine artery embolization procedure was canceled. She is doing well on her antihypertensive and is compliant with levothyroxine. Endorses being under some form of stress as her sister with his son who has ADHD have recently become homeless and she is having to play the hotel bill for them. She has no additional concerns today.  Past Medical History:  Diagnosis Date  . Anemia   . Hypertension   . Thyroid disease     Past Surgical History:  Procedure Laterality Date  . IR GENERIC HISTORICAL  05/28/2016   IR RADIOLOGIST EVAL & MGMT 05/28/2016 Markus Daft, MD [facility] INTERV RAD  . IR RADIOLOGIST EVAL & MGMT  03/06/2017  . IR RADIOLOGIST EVAL & MGMT  02/17/2018    Allergies  Allergen Reactions  . Hctz [Hydrochlorothiazide] Shortness Of Breath and Other (See Comments)    wheezing  . Tramadol Anaphylaxis and Swelling  . Hydrocodone Nausea And Vomiting    dizzines  . Percocet [Oxycodone-Acetaminophen] Nausea And Vomiting and Other (See Comments)    weakness     Outpatient Medications Prior to Visit  Medication Sig Dispense Refill  . acetaminophen (TYLENOL) 325 MG tablet Take 650 mg by mouth every 6 (six) hours as needed for mild pain.    . calcium carbonate (CALCIUM 600) 600 MG TABS tablet Take 1 tablet (600 mg total) by mouth daily with breakfast. 60 tablet 3  . ibuprofen (ADVIL,MOTRIN) 600 MG tablet Take 1 tablet (600 mg total) by mouth every 8 (eight) hours as needed. 60 tablet 1  . levothyroxine (SYNTHROID, LEVOTHROID) 175 MCG tablet TAKE 1 TABLET BY MOUTH  DAILY BEFORE BREAKFAST. 30 tablet 1  . Multiple Vitamin (MULTIVITAMIN WITH MINERALS) TABS tablet Take 1 tablet by mouth daily. 90 tablet 3  . amLODipine (NORVASC) 10 MG tablet TAKE 1 TABLET BY MOUTH DAILY. FOR BLOOD PRESSURE. 30 tablet 6  . metoprolol tartrate (LOPRESSOR) 25 MG tablet Take 1 tablet (25 mg total) by mouth 2 (two) times daily. 60 tablet 6  . benzonatate (TESSALON) 100 MG capsule Take 1 capsule (100 mg total) by mouth every 8 (eight) hours. (Patient not taking: Reported on 10/13/2017) 21 capsule 0  . cholecalciferol (VITAMIN D) 1000 UNITS tablet Take 1 tablet (1,000 Units total) by mouth daily. (Patient not taking: Reported on 05/18/2018) 90 tablet 3  . megestrol (MEGACE) 40 MG tablet Take 1 tablet (40 mg total) by mouth 2 (two) times daily. (Patient not taking: Reported on 05/18/2018) 60 tablet 3   No facility-administered medications prior to visit.     ROS Review of Systems  Constitutional: Negative for activity change, appetite change and fatigue.  HENT: Negative for congestion, sinus pressure and sore throat.   Eyes: Negative for visual disturbance.  Respiratory: Negative for cough, chest tightness, shortness of breath and wheezing.   Cardiovascular: Negative for chest pain and palpitations.  Gastrointestinal: Negative for abdominal distention, abdominal pain and constipation.  Endocrine: Negative for polydipsia.  Genitourinary: Negative for dysuria and frequency.  Musculoskeletal: Negative for arthralgias and back pain.  Skin: Negative for rash.  Neurological: Negative for tremors, light-headedness and numbness.  Hematological: Does not bruise/bleed easily.  Psychiatric/Behavioral: Negative for agitation and behavioral problems.    Objective:  BP 132/80   Pulse 81   Temp 98 F (36.7 C) (Oral)   Ht 5' 8"  (1.727 m)   Wt 208 lb (94.3 kg)   SpO2 98%   BMI 31.63 kg/m   BP/Weight 05/18/2018 6/46/8032 08/06/2480  Systolic BP 500 370 488  Diastolic BP 80 92 90  Wt.  (Lbs) 208 201 204.8  BMI 31.63 30.56 31.14      Physical Exam  Constitutional: She is oriented to person, place, and time. She appears well-developed and well-nourished.  Cardiovascular: Normal rate, normal heart sounds and intact distal pulses.  No murmur heard. Pulmonary/Chest: Effort normal and breath sounds normal. She has no wheezes. She has no rales. She exhibits no tenderness.  Abdominal: Soft. Bowel sounds are normal. She exhibits no distension and no mass. There is no tenderness.  Musculoskeletal: Normal range of motion.  Neurological: She is alert and oriented to person, place, and time.  Skin: Skin is warm and dry.  Psychiatric: She has a normal mood and affect.    CMP Latest Ref Rng & Units 12/11/2017 12/02/2017 05/07/2017  Glucose 65 - 99 mg/dL 76 86 78  BUN 6 - 24 mg/dL 12 11 11   Creatinine 0.57 - 1.00 mg/dL 1.02(H) 0.95 0.85  Sodium 134 - 144 mmol/L 143 141 139  Potassium 3.5 - 5.2 mmol/L 3.6 3.1(L) 3.9  Chloride 96 - 106 mmol/L 105 105 102  CO2 20 - 29 mmol/L 23 24 22   Calcium 8.7 - 10.2 mg/dL 9.2 9.6 9.3  Total Protein 6.0 - 8.5 g/dL 6.6 - 6.8  Total Bilirubin 0.0 - 1.2 mg/dL 0.3 - 0.5  Alkaline Phos 39 - 117 IU/L 48 - 40  AST 0 - 40 IU/L 12 - 18  ALT 0 - 32 IU/L 16 - 22    Lab Results  Component Value Date   TSH 21.550 (H) 12/11/2017     Assessment & Plan:   1. Essential hypertension, benign Controlled Counseled on blood pressure goal of less than 130/80, low-sodium, DASH diet, medication compliance, 150 minutes of moderate intensity exercise per week. Discussed medication compliance, adverse effects. - amLODipine (NORVASC) 10 MG tablet; TAKE 1 TABLET BY MOUTH DAILY. FOR BLOOD PRESSURE.  Dispense: 30 tablet; Refill: 6 - metoprolol tartrate (LOPRESSOR) 25 MG tablet; Take 1 tablet (25 mg total) by mouth 2 (two) times daily.  Dispense: 60 tablet; Refill: 6 - CMP14+EGFR - T4, free - TSH  2. Other specified hypothyroidism Uncontrolled We will send of TSH  and adjust regimen accordingly  3. Fibroids Menorrhagia has improved No longer needs Megace No indication for uterine artery embolization at this time  4. Screening for HIV (human immunodeficiency virus) - HIV Antibody (routine testing w rflx)  5. Screening for colon cancer - Ambulatory referral to Gastroenterology   Meds ordered this encounter  Medications  . amLODipine (NORVASC) 10 MG tablet    Sig: TAKE 1 TABLET BY MOUTH DAILY. FOR BLOOD PRESSURE.    Dispense:  30 tablet    Refill:  6  . metoprolol tartrate (LOPRESSOR) 25 MG tablet    Sig: Take 1 tablet (25 mg total) by mouth 2 (two) times daily.    Dispense:  60 tablet    Refill:  6    Follow-up: Return in about 6 months (around 11/17/2018) for Follow-up of chronic medical conditions.   Charlott Rakes MD

## 2018-05-19 ENCOUNTER — Other Ambulatory Visit: Payer: Self-pay | Admitting: Family Medicine

## 2018-05-19 DIAGNOSIS — E038 Other specified hypothyroidism: Secondary | ICD-10-CM

## 2018-05-19 LAB — CMP14+EGFR
A/G RATIO: 1.7 (ref 1.2–2.2)
ALK PHOS: 63 IU/L (ref 39–117)
ALT: 14 IU/L (ref 0–32)
AST: 14 IU/L (ref 0–40)
Albumin: 4.2 g/dL (ref 3.5–5.5)
BUN/Creatinine Ratio: 16 (ref 9–23)
BUN: 15 mg/dL (ref 6–24)
Bilirubin Total: 0.2 mg/dL (ref 0.0–1.2)
CO2: 25 mmol/L (ref 20–29)
CREATININE: 0.91 mg/dL (ref 0.57–1.00)
Calcium: 9.2 mg/dL (ref 8.7–10.2)
Chloride: 104 mmol/L (ref 96–106)
GFR calc Af Amer: 85 mL/min/{1.73_m2} (ref >59)
GFR calc non Af Amer: 74 mL/min/{1.73_m2} (ref >59)
GLOBULIN, TOTAL: 2.5 g/dL (ref 1.5–4.5)
Glucose: 92 mg/dL (ref 65–99)
POTASSIUM: 3.8 mmol/L (ref 3.5–5.2)
SODIUM: 143 mmol/L (ref 134–144)
Total Protein: 6.7 g/dL (ref 6.0–8.5)

## 2018-05-19 LAB — HIV ANTIBODY (ROUTINE TESTING W REFLEX): HIV Screen 4th Generation wRfx: NONREACTIVE

## 2018-05-19 LAB — T4, FREE: FREE T4: 1.85 ng/dL — AB (ref 0.82–1.77)

## 2018-05-19 LAB — TSH: TSH: 0.168 u[IU]/mL — AB (ref 0.450–4.500)

## 2018-05-19 MED ORDER — LEVOTHYROXINE SODIUM 150 MCG PO TABS: 150.0000 ug | ORAL_TABLET | Freq: Every day | ORAL | 3 refills | Status: DC

## 2018-05-23 ENCOUNTER — Emergency Department (HOSPITAL_COMMUNITY)
Admission: EM | Admit: 2018-05-23 | Discharge: 2018-05-23 | Disposition: A | Discharge status: Home / Self Care | Payer: Medicaid Other | Attending: Emergency Medicine | Admitting: Emergency Medicine

## 2018-05-23 ENCOUNTER — Encounter (HOSPITAL_COMMUNITY): Payer: Self-pay | Admitting: Emergency Medicine

## 2018-05-23 ENCOUNTER — Emergency Department (HOSPITAL_COMMUNITY): Payer: Medicaid Other

## 2018-05-23 DIAGNOSIS — Z23 Encounter for immunization: Secondary | ICD-10-CM | POA: Insufficient documentation

## 2018-05-23 DIAGNOSIS — I10 Essential (primary) hypertension: Secondary | ICD-10-CM | POA: Insufficient documentation

## 2018-05-23 DIAGNOSIS — W503XXA Accidental bite by another person, initial encounter: Secondary | ICD-10-CM | POA: Insufficient documentation

## 2018-05-23 DIAGNOSIS — E039 Hypothyroidism, unspecified: Secondary | ICD-10-CM | POA: Insufficient documentation

## 2018-05-23 DIAGNOSIS — S0993XA Unspecified injury of face, initial encounter: Secondary | ICD-10-CM

## 2018-05-23 DIAGNOSIS — Y929 Unspecified place or not applicable: Secondary | ICD-10-CM | POA: Insufficient documentation

## 2018-05-23 DIAGNOSIS — Y939 Activity, unspecified: Secondary | ICD-10-CM | POA: Insufficient documentation

## 2018-05-23 DIAGNOSIS — Z79899 Other long term (current) drug therapy: Secondary | ICD-10-CM | POA: Insufficient documentation

## 2018-05-23 DIAGNOSIS — M79642 Pain in left hand: Secondary | ICD-10-CM

## 2018-05-23 DIAGNOSIS — Y999 Unspecified external cause status: Secondary | ICD-10-CM | POA: Insufficient documentation

## 2018-05-23 MED ORDER — AMOXICILLIN-POT CLAVULANATE 875-125 MG PO TABS: 1.0000 | ORAL_TABLET | Freq: Two times a day (BID) | ORAL | 0 refills | Status: DC

## 2018-05-23 MED ORDER — TETANUS-DIPHTH-ACELL PERTUSSIS 5-2.5-18.5 LF-MCG/0.5 IM SUSP
0.5000 mL | Freq: Once | INTRAMUSCULAR | Status: AC
  Administered 2018-05-23: 0.5 mL via INTRAMUSCULAR
  Filled 2018-05-23: qty 0.5

## 2018-05-23 MED ORDER — IBUPROFEN 400 MG PO TABS
600.0000 mg | ORAL_TABLET | Freq: Once | ORAL | Status: AC
  Administered 2018-05-23: 600 mg via ORAL
  Filled 2018-05-23: qty 1
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
## 2018-05-23 NOTE — Discharge Instructions (Signed)
Your CT and xray were reassuring. I am starting you on antibiotics to prevent infection. Please take all of your antibiotics until finished!   You may develop abdominal discomfort or diarrhea from the antibiotic.  You may help offset this with probiotics which you can buy or get in yogurt. Do not eat or take the probiotics until 2 hours after your antibiotic. Do not take your medicine if develop an itchy rash, swelling in your mouth or lips, or difficulty breathing.  Your tetanus was updated in the department.  Follow attached handout on woundcare.  If you develop worsening or new concerning symptoms you can return to the emergency department for re-evaluation.  Return sooner for  -Fever -Changes in your vision -Redness/swelling/pain with range of motion of your left thumb or of the hand
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
## 2018-05-23 NOTE — ED Notes (Signed)
Patient transported to X-ray 

## 2018-06-04 ENCOUNTER — Ambulatory Visit: Payer: Self-pay | Attending: Family Medicine

## 2018-08-26 ENCOUNTER — Encounter (HOSPITAL_COMMUNITY): Payer: Self-pay | Admitting: Emergency Medicine

## 2018-08-26 ENCOUNTER — Ambulatory Visit (HOSPITAL_COMMUNITY)
Admission: EM | Admit: 2018-08-26 | Discharge: 2018-08-26 | Disposition: A | Discharge status: Home / Self Care | Payer: Medicaid Other | Attending: Family Medicine | Admitting: Family Medicine

## 2018-08-26 DIAGNOSIS — J4521 Mild intermittent asthma with (acute) exacerbation: Secondary | ICD-10-CM

## 2018-08-26 DIAGNOSIS — J029 Acute pharyngitis, unspecified: Secondary | ICD-10-CM

## 2018-08-26 DIAGNOSIS — J320 Chronic maxillary sinusitis: Secondary | ICD-10-CM | POA: Insufficient documentation

## 2018-08-26 DIAGNOSIS — R05 Cough: Secondary | ICD-10-CM

## 2018-08-26 MED ORDER — PREDNISONE 50 MG PO TABS: ORAL_TABLET | ORAL | 0 refills | Status: DC

## 2018-08-26 MED ORDER — FLUTICASONE PROPIONATE 50 MCG/ACT NA SUSP: 2.0000 | Freq: Every day | NASAL | 12 refills | Status: DC

## 2018-08-26 MED ORDER — ALBUTEROL SULFATE HFA 108 (90 BASE) MCG/ACT IN AERS: 2.0000 | INHALATION_SPRAY | RESPIRATORY_TRACT | 11 refills | Status: DC | PRN

## 2018-08-26 MED ORDER — CEFDINIR 300 MG PO CAPS: 600.0000 mg | ORAL_CAPSULE | Freq: Every day | ORAL | 0 refills | Status: DC

## 2018-08-26 NOTE — ED Triage Notes (Signed)
Pt presents to [facility] for assessment of cough x 5 days, c/o throat tightness/soreness, back pain and rib pain.

## 2018-08-26 NOTE — ED Provider Notes (Signed)
[name]    CSN: [id number] Arrival date & time: 08/26/18  0901     History   Chief Complaint Chief Complaint  Patient presents with  . Cough    HPI Heidi Barrett is a 53 y.o. female.   This is the initial visit at [facility] urgent care for this 53 year old woman who is complaining of cough and congestion for 5 days.  She has a history of high blood pressure and hypothyroidism.  Chest is tight with congestion.  She has an associated headache, and reports wheezing.  She has had no fever or chills.  She does have an associated sore throat.   Patient reports difficulty sleeping because of the cough.  She does have some mild shortness of breath and myalgia.  Patient is taking vitamin C, lemon tea, and orange juice to help control the symptoms.  She has had some relief with the Flonase and Proventil that has been prescribed before.  She had a flu shot this year.  Patient works in [employer] at [employer] where she works.  Patient reports being very anxious about what is going on today.     Past Medical History:  Diagnosis Date  . Anemia   . Hypertension   . Thyroid disease     Patient Active Problem List   Diagnosis Date Noted  . Palpitation 09/10/2017  . Atypical chest pain 07/12/2016  . Generalized anxiety disorder 07/04/2016  . Right lower quadrant abdominal pain 02/08/2016  . Eye muscle twitches 02/08/2016  . Fibroid, uterine 02/08/2016  . Piriformis syndrome of right side 01/31/2015  . Allergic reaction caused by a drug 01/27/2015  . Heel spur 01/16/2015  . Essential hypertension, benign 01/16/2015  . Benign essential HTN 05/31/2014  . Plantar fasciitis 05/31/2014  . Other specified hypothyroidism 05/31/2014  . Intramural leiomyoma of uterus 05/31/2014  . Menorrhagia 01/04/2013  . Fibroids 01/04/2013    Past Surgical History:  Procedure Laterality Date  . IR GENERIC HISTORICAL  05/28/2016   IR RADIOLOGIST EVAL & MGMT 05/28/2016  Markus Daft, MD [facility] INTERV RAD  . IR RADIOLOGIST EVAL & MGMT  03/06/2017  . IR RADIOLOGIST EVAL & MGMT  02/17/2018    OB History    Gravida  0   Para      Term      Preterm      AB      Living        SAB      TAB      Ectopic      Multiple      Live Births               Home Medications    Prior to Admission medications   Medication Sig Start Date End Date Taking? Authorizing Provider  amLODipine (NORVASC) 10 MG tablet TAKE 1 TABLET BY MOUTH DAILY. FOR BLOOD PRESSURE. 05/18/18  Yes Charlott Rakes, MD  calcium carbonate (CALCIUM 600) 600 MG TABS tablet Take 1 tablet (600 mg total) by mouth daily with breakfast. 05/31/14  Yes Jegede, Olugbemiga E, MD  cholecalciferol (VITAMIN D) 1000 UNITS tablet Take 1 tablet (1,000 Units total) by mouth daily. 05/31/14  Yes [name], MD  levothyroxine (SYNTHROID, LEVOTHROID) 150 MCG tablet Take 1 tablet (150 mcg total) by mouth daily before breakfast. 05/19/18  Yes [name], [name], MD  metoprolol tartrate (LOPRESSOR) 25 MG tablet Take 1 tablet (25 mg total) by mouth 2 (two) times daily. 05/18/18  Yes  Charlott Rakes, MD  acetaminophen (TYLENOL) 325 MG tablet Take 650 mg by mouth every 6 (six) hours as needed for mild pain.    [provider]  albuterol (PROVENTIL HFA;VENTOLIN HFA) 108 (90 Base) MCG/ACT inhaler Inhale 2 puffs into the lungs every 4 (four) hours as needed for wheezing or shortness of breath (cough, shortness of breath or wheezing.). 08/26/18   Robyn Haber, MD  cefdinir (OMNICEF) 300 MG capsule Take 2 capsules (600 mg total) by mouth daily. 08/26/18   Robyn Haber, MD  fluticasone (FLONASE) 50 MCG/ACT nasal spray Place 2 sprays into both nostrils daily. 08/26/18   Robyn Haber, MD  ibuprofen (ADVIL,MOTRIN) 600 MG tablet Take 1 tablet (600 mg total) by mouth every 8 (eight) hours as needed. 12/10/17   Charlott Rakes, MD  Multiple Vitamin (MULTIVITAMIN WITH MINERALS) TABS tablet Take 1 tablet  by mouth daily. 05/31/14   [name], MD  predniSONE (DELTASONE) 50 MG tablet One daily 08/26/18   Robyn Haber, MD    Family History Family History  Problem Relation Age of Onset  . Heart disease Mother   . Stroke Mother   . Cancer Father        prostate    Social History Social History   Tobacco Use  . Smoking status: Never Smoker  . Smokeless tobacco: Never Used  Substance Use Topics  . Alcohol use: No  . Drug use: No     Allergies   Hctz [hydrochlorothiazide]; Tramadol; Hydrocodone; and Percocet [oxycodone-acetaminophen]   Review of Systems Review of Systems   Physical Exam Triage Vital Signs ED Triage Vitals  Enc Vitals Group     BP 08/26/18 0935 (!) 140/98     Pulse Rate 08/26/18 0935 78     Resp 08/26/18 0935 16     Temp 08/26/18 0935 97.8 F (36.6 C)     Temp Source 08/26/18 0935 Oral     SpO2 08/26/18 0935 100 %     Weight --      Height --      Head Circumference --      Peak Flow --      Pain Score 08/26/18 0936 8     Pain Loc --      Pain Edu? --      Excl. in [place]? --    No data found.  Updated Vital Signs BP (!) 140/98 (BP Location: Left Arm)   Pulse 78   Temp 97.8 F (36.6 C) (Oral)   Resp 16   SpO2 100%    Physical Exam Vitals signs and nursing note reviewed.  Constitutional:      Appearance: Normal appearance.  HENT:     Head: Normocephalic and atraumatic.     Comments: Tender sinuses to palpation    Right Ear: Tympanic membrane normal.     Left Ear: Tympanic membrane normal.     Nose: Congestion present.     Comments: Red swollen turbinates bilaterally    Mouth/Throat:     Mouth: Mucous membranes are moist.     Pharynx: No posterior oropharyngeal erythema.  Eyes:     Conjunctiva/sclera: Conjunctivae normal.  Neck:     Musculoskeletal: Normal range of motion and neck supple.  Pulmonary:     Effort: Pulmonary effort is normal.     Breath sounds: No wheezing.     Comments: Some coarse breath  sounds Musculoskeletal: Normal range of motion.  Lymphadenopathy:     Cervical: No cervical adenopathy.  Skin:  General: Skin is warm and dry.  Neurological:     General: No focal deficit present.     Mental Status: She is alert and oriented to person, place, and time.      UC Treatments / Results  Labs (all labs ordered are listed, but only abnormal results are displayed) Labs Reviewed - No data to display  EKG None  Radiology No results found.  Procedures Procedures (including critical care time)  Medications Ordered in UC Medications - No data to display  Initial Impression / Assessment and Plan / UC Course  I have reviewed the triage vital signs and the nursing notes.  Pertinent labs & imaging results that were available during my care of the patient were reviewed by me and considered in my medical decision making (see chart for details).    Final Clinical Impressions(s) / UC Diagnoses   Final diagnoses:  Mild intermittent asthma with exacerbation  Chronic maxillary sinusitis   Discharge Instructions   None    ED Prescriptions    Medication Sig Dispense Auth. Provider   predniSONE (DELTASONE) 50 MG tablet One daily 4 tablet Robyn Haber, MD   cefdinir (OMNICEF) 300 MG capsule Take 2 capsules (600 mg total) by mouth daily. 20 capsule Robyn Haber, MD   fluticasone [facility]) 50 MCG/ACT nasal spray Place 2 sprays into both nostrils daily. 16 g Robyn Haber, MD   albuterol (PROVENTIL HFA;VENTOLIN HFA) 108 (90 Base) MCG/ACT inhaler Inhale 2 puffs into the lungs every 4 (four) hours as needed for wheezing or shortness of breath (cough, shortness of breath or wheezing.). [place], MD     Controlled Substance Prescriptions [place] Controlled Substance Registry consulted? Not Applicable   Robyn Haber, MD 08/26/18 1040

## 2018-09-01 ENCOUNTER — Telehealth (HOSPITAL_COMMUNITY): Payer: Self-pay | Admitting: Emergency Medicine

## 2018-09-01 ENCOUNTER — Telehealth: Payer: Self-pay | Admitting: Family Medicine

## 2018-09-01 NOTE — Telephone Encounter (Signed)
Call pt regarding antibiotic given at urgent care. Last dose will be this Friday 09/04/18. Pt states having some symptoms and needs fu after antiotics complete as well. Call pt [phone].

## 2018-09-01 NOTE — Telephone Encounter (Signed)
Pt called stating that she was having some diarrhea from antibiotics which is normal for her in the past but otherwise feeling better; pt encouraged to follow up with PCP for further eval and work note if needed; pt verbalized understanding

## 2018-09-02 NOTE — Telephone Encounter (Signed)
Please call Pt back when you get a chance

## 2018-09-02 NOTE — Telephone Encounter (Signed)
Patient was called and a voicemail was left informing patient to return phone call. 

## 2018-09-03 ENCOUNTER — Other Ambulatory Visit: Payer: Self-pay

## 2018-09-03 ENCOUNTER — Ambulatory Visit (HOSPITAL_COMMUNITY)
Admission: EM | Admit: 2018-09-03 | Discharge: 2018-09-03 | Disposition: A | Discharge status: Home / Self Care | Payer: Self-pay | Attending: Family Medicine | Admitting: Family Medicine

## 2018-09-03 ENCOUNTER — Encounter (HOSPITAL_COMMUNITY): Payer: Self-pay

## 2018-09-03 DIAGNOSIS — R197 Diarrhea, unspecified: Secondary | ICD-10-CM | POA: Insufficient documentation

## 2018-09-03 NOTE — ED Triage Notes (Signed)
Pt cc she has diarrhea. X 5 days

## 2018-09-03 NOTE — Discharge Instructions (Addendum)
Keep taking your medication as prescribed and finish the antibiotics I would like for you to start taking a probiotic daily.  Align is a good choice.  Work note given Follow up as needed for continued or worsening symptoms

## 2018-09-03 NOTE — ED Provider Notes (Signed)
[place]    CSN: [id number] Arrival date & time: 09/03/18  0109     History   Chief Complaint Chief Complaint  Patient presents with  . Diarrhea    HPI Heidi Barrett is a 53 y.o. female.   Patient is a 53 year old female who presents with complaints of diarrhea.  This is been present for approximate 5 days she reports at least 5 episodes of diarrhea daily.  The diarrhea is semisolid without hematochezia.  She denies any foul smell to the diarrhea.  Patient recently diagnosed with upper respiratory infection and has been taken antibiotics.  She has approximate 2 days left of her antibiotics.  She denies any associated fever, chills, myalgias.  Her respiratory symptoms have improved.  She has not taken anything over-the-counter for the diarrhea.  She has been eating and drinking normally.  Her biggest concern is that she works at [employer] and [employer] daily.  She otherwise feels okay.  No nausea or vomiting.  No recent sick contacts or recent traveling.  ROS per HPI      Past Medical History:  Diagnosis Date  . Anemia   . Hypertension   . Thyroid disease     Patient Active Problem List   Diagnosis Date Noted  . Palpitation 09/10/2017  . Atypical chest pain 07/12/2016  . Generalized anxiety disorder 07/04/2016  . Right lower quadrant abdominal pain 02/08/2016  . Eye muscle twitches 02/08/2016  . Fibroid, uterine 02/08/2016  . Piriformis syndrome of right side 01/31/2015  . Allergic reaction caused by a drug 01/27/2015  . Heel spur 01/16/2015  . Essential hypertension, benign 01/16/2015  . Benign essential HTN 05/31/2014  . Plantar fasciitis 05/31/2014  . Other specified hypothyroidism 05/31/2014  . Intramural leiomyoma of uterus 05/31/2014  . Menorrhagia 01/04/2013  . Fibroids 01/04/2013    Past Surgical History:  Procedure Laterality Date  . IR GENERIC HISTORICAL  05/28/2016   IR RADIOLOGIST EVAL & MGMT 05/28/2016 Markus Daft, MD [facility]  INTERV RAD  . IR RADIOLOGIST EVAL & MGMT  03/06/2017  . IR RADIOLOGIST EVAL & MGMT  02/17/2018    OB History    Gravida  0   Para      Term      Preterm      AB      Living        SAB      TAB      Ectopic      Multiple      Live Births               Home Medications    Prior to Admission medications   Medication Sig Start Date End Date Taking? Authorizing Provider  acetaminophen (TYLENOL) 325 MG tablet Take 650 mg by mouth every 6 (six) hours as needed for mild pain.    [provider]  albuterol (PROVENTIL HFA;VENTOLIN HFA) 108 (90 Base) MCG/ACT inhaler Inhale 2 puffs into the lungs every 4 (four) hours as needed for wheezing or shortness of breath (cough, shortness of breath or wheezing.). 08/26/18   Robyn Haber, MD  amLODipine (NORVASC) 10 MG tablet TAKE 1 TABLET BY MOUTH DAILY. FOR BLOOD PRESSURE. 05/18/18   Charlott Rakes, MD  calcium carbonate (CALCIUM 600) 600 MG TABS tablet Take 1 tablet (600 mg total) by mouth daily with breakfast. 05/31/14   [name], MD  cefdinir (OMNICEF) 300 MG capsule Take 2 capsules (600 mg total) by mouth daily. 08/26/18  Robyn Haber, MD  cholecalciferol (VITAMIN D) 1000 UNITS tablet Take 1 tablet (1,000 Units total) by mouth daily. 05/31/14   [name], MD  fluticasone (FLONASE) 50 MCG/ACT nasal spray Place 2 sprays into both nostrils daily. 08/26/18   Robyn Haber, MD  ibuprofen (ADVIL,MOTRIN) 600 MG tablet Take 1 tablet (600 mg total) by mouth every 8 (eight) hours as needed. 12/10/17   Charlott Rakes, MD  levothyroxine (SYNTHROID, LEVOTHROID) 150 MCG tablet Take 1 tablet (150 mcg total) by mouth daily before breakfast. 05/19/18   Charlott Rakes, MD  metoprolol tartrate (LOPRESSOR) 25 MG tablet Take 1 tablet (25 mg total) by mouth 2 (two) times daily. 05/18/18   Charlott Rakes, MD  Multiple Vitamin (MULTIVITAMIN WITH MINERALS) TABS tablet Take 1 tablet by mouth daily. 05/31/14   [name], MD  predniSONE (DELTASONE) 50 MG tablet One daily 08/26/18   Robyn Haber, MD    Family History Family History  Problem Relation Age of Onset  . Heart disease Mother   . Stroke Mother   . Cancer Father        prostate    Social History Social History   Tobacco Use  . Smoking status: Never Smoker  . Smokeless tobacco: Never Used  Substance Use Topics  . Alcohol use: No  . Drug use: No     Allergies   Hctz [hydrochlorothiazide]; Tramadol; Hydrocodone; and Percocet [oxycodone-acetaminophen]   Review of Systems Review of Systems   Physical Exam Triage Vital Signs ED Triage Vitals  Enc Vitals Group     BP 09/03/18 0923 (!) 166/98     Pulse Rate 09/03/18 0923 72     Resp 09/03/18 0923 18     Temp 09/03/18 0923 98.3 F (36.8 C)     Temp Source 09/03/18 0923 Oral     SpO2 09/03/18 0923 100 %     Weight 09/03/18 0920 211 lb (95.7 kg)     Height --      Head Circumference --      Peak Flow --      Pain Score 09/03/18 0920 8     Pain Loc --      Pain Edu? --      Excl. in [place]? --    No data found.  Updated Vital Signs BP (!) 166/98 (BP Location: Right Arm)   Pulse 72   Temp 98.3 F (36.8 C) (Oral)   Resp 18   Wt 211 lb (95.7 kg)   LMP 08/27/2018   SpO2 100%   BMI 32.08 kg/m   Visual Acuity Right Eye Distance:   Left Eye Distance:   Bilateral Distance:    Right Eye Near:   Left Eye Near:    Bilateral Near:     Physical Exam Vitals signs and nursing note reviewed.  Constitutional:      General: She is not in acute distress.    Appearance: She is well-developed.  HENT:     Head: Normocephalic and atraumatic.     Nose: Nose normal.     Mouth/Throat:     Pharynx: Oropharynx is clear.  Eyes:     Conjunctiva/sclera: Conjunctivae normal.  Neck:     Musculoskeletal: Neck supple.  Cardiovascular:     Rate and Rhythm: Normal rate and regular rhythm.     Heart sounds: No murmur.  Pulmonary:     Effort: Pulmonary effort is normal.  No respiratory distress.     Breath sounds: Normal breath sounds.  Abdominal:     Palpations: Abdomen is soft.     Tenderness: There is no abdominal tenderness.  Musculoskeletal: Normal range of motion.  Skin:    General: Skin is warm and dry.  Neurological:     Mental Status: She is alert.  Psychiatric:        Mood and Affect: Mood normal.      UC Treatments / Results  Labs (all labs ordered are listed, but only abnormal results are displayed) Labs Reviewed - No data to display  EKG None  Radiology No results found.  Procedures Procedures (including critical care time)  Medications Ordered in UC Medications - No data to display  Initial Impression / Assessment and Plan / UC Course  I have reviewed the triage vital signs and the nursing notes.  Pertinent labs & imaging results that were available during my care of the patient were reviewed by me and considered in my medical decision making (see chart for details).     Patient with 5 days of diarrhea Doubt C. difficile at this time Will have patient start probiotic to see if this helps her symptoms Work note given as requested Instructed that if she continues to have symptoms next week that she needs to follow-up with her doctor Follow up as needed for continued or worsening symptoms  Final Clinical Impressions(s) / UC Diagnoses   Final diagnoses:  Diarrhea, unspecified type     Discharge Instructions     Keep taking your medication as prescribed and finish the antibiotics I would like for you to start taking a probiotic daily.  [facility] is a good choice.  Work note given Follow up as needed for continued or worsening symptoms     ED Prescriptions    None     Controlled Substance Prescriptions [place] Controlled Substance Registry consulted? Not Applicable   Orvan July, NP 09/03/18 1009

## 2018-09-04 NOTE — Telephone Encounter (Signed)
Patient was called and a voicemail was left informing patient to return phone call for lab results. 

## 2018-09-09 ENCOUNTER — Other Ambulatory Visit: Payer: Self-pay | Admitting: Family Medicine

## 2018-09-09 DIAGNOSIS — E038 Other specified hypothyroidism: Secondary | ICD-10-CM

## 2018-09-14 ENCOUNTER — Telehealth: Payer: Self-pay | Admitting: Family Medicine

## 2018-09-14 NOTE — Telephone Encounter (Signed)
1) Medication(s) Requested (by name): levothyroxine 2) Pharmacy of Choice:  [facility]

## 2018-09-15 NOTE — Telephone Encounter (Signed)
Pt picked up RX from pharmacy shortly after this message was sent. No further action is required.

## 2018-09-24 ENCOUNTER — Ambulatory Visit: Payer: Medicaid Other | Admitting: Family Medicine

## 2018-10-07 ENCOUNTER — Other Ambulatory Visit: Payer: Self-pay | Admitting: Pharmacist

## 2018-10-07 ENCOUNTER — Encounter: Payer: Self-pay | Admitting: Family Medicine

## 2018-10-07 ENCOUNTER — Ambulatory Visit: Payer: BLUE CROSS/BLUE SHIELD | Attending: Family Medicine | Admitting: Family Medicine

## 2018-10-07 VITALS — BP 142/94 | HR 74 | Temp 98.0°F | Ht 68.0 in | Wt 213.2 lb

## 2018-10-07 DIAGNOSIS — E038 Other specified hypothyroidism: Secondary | ICD-10-CM | POA: Diagnosis not present

## 2018-10-07 DIAGNOSIS — H1132 Conjunctival hemorrhage, left eye: Secondary | ICD-10-CM | POA: Diagnosis not present

## 2018-10-07 DIAGNOSIS — I10 Essential (primary) hypertension: Secondary | ICD-10-CM | POA: Diagnosis not present

## 2018-10-07 DIAGNOSIS — H359 Unspecified retinal disorder: Secondary | ICD-10-CM

## 2018-10-07 MED ORDER — METOPROLOL TARTRATE 25 MG PO TABS: 25.0000 mg | ORAL_TABLET | Freq: Two times a day (BID) | ORAL | 6 refills | Status: DC

## 2018-10-07 MED ORDER — ALBUTEROL SULFATE HFA 108 (90 BASE) MCG/ACT IN AERS: 2.0000 | INHALATION_SPRAY | RESPIRATORY_TRACT | 2 refills | Status: AC | PRN

## 2018-10-07 MED ORDER — AMLODIPINE BESYLATE 10 MG PO TABS: ORAL_TABLET | ORAL | 6 refills | Status: DC

## 2018-10-07 NOTE — Patient Instructions (Signed)

## 2018-10-08 LAB — TSH: TSH: 3.56 u[IU]/mL (ref 0.450–4.500)

## 2018-10-08 LAB — T4, FREE: Free T4: 1.47 ng/dL (ref 0.82–1.77)

## 2018-10-08 MED ORDER — LEVOTHYROXINE SODIUM 150 MCG PO TABS: 150.0000 ug | ORAL_TABLET | Freq: Every day | ORAL | 3 refills | Status: DC

## 2018-10-08 NOTE — Progress Notes (Signed)
Subjective:  Patient ID: Heidi Barrett, female    DOB: July 14, 1966  Age: 53 y.o. MRN: [id number]  CC: Hospitalization Follow-up   HPI Heidi Barrett is a 53 year old female with a history of hypertension, menorrhagia secondary to fibroids, hypothyroidism who presents today for follow-up visit. She endorses compliance with her antihypertensive and an exercise regimen and is working on losing weight.  Denies adverse effects from her medications. Doing well on her levothyroxine which she takes first thing in the morning 1 hour before all other medications or food. Denies abnormal uterine bleed from her fibroids. She complains of noticing blood in her left eye which initially started but has now resolved and this occurred a few days ago.  Would like to see an ophthalmologist because she was previously informed she had a possible retinal tear by an optometrist but due to lack of medical insurance could not undergo the glaucoma test.  Past Medical History:  Diagnosis Date  . Anemia   . Hypertension   . Thyroid disease     Past Surgical History:  Procedure Laterality Date  . IR GENERIC HISTORICAL  05/28/2016   IR RADIOLOGIST EVAL & MGMT 05/28/2016 Markus Daft, MD [facility] INTERV RAD  . IR RADIOLOGIST EVAL & MGMT  03/06/2017  . IR RADIOLOGIST EVAL & MGMT  02/17/2018    Family History  Problem Relation Age of Onset  . Heart disease Mother   . Stroke Mother   . Cancer Father        prostate    Allergies  Allergen Reactions  . Hctz [Hydrochlorothiazide] Shortness Of Breath and Other (See Comments)    wheezing  . Tramadol Anaphylaxis and Swelling  . Hydrocodone Nausea And Vomiting    dizzines  . Percocet [Oxycodone-Acetaminophen] Nausea And Vomiting and Other (See Comments)    weakness    Outpatient Medications Prior to Visit  Medication Sig Dispense Refill  . acetaminophen (TYLENOL) 325 MG tablet Take 650 mg by mouth every 6 (six) hours as needed for mild pain.    . calcium  carbonate (CALCIUM 600) 600 MG TABS tablet Take 1 tablet (600 mg total) by mouth daily with breakfast. 60 tablet 3  . fluticasone (FLONASE) 50 MCG/ACT nasal spray Place 2 sprays into both nostrils daily. 16 g 12  . ibuprofen (ADVIL,MOTRIN) 600 MG tablet Take 1 tablet (600 mg total) by mouth every 8 (eight) hours as needed. 60 tablet 1  . levothyroxine (SYNTHROID, LEVOTHROID) 150 MCG tablet TAKE 1 TABLET (150 MCG TOTAL) BY MOUTH DAILY BEFORE BREAKFAST. 30 tablet 0  . Multiple Vitamin (MULTIVITAMIN WITH MINERALS) TABS tablet Take 1 tablet by mouth daily. 90 tablet 3  . albuterol (PROVENTIL HFA;VENTOLIN HFA) 108 (90 Base) MCG/ACT inhaler Inhale 2 puffs into the lungs every 4 (four) hours as needed for wheezing or shortness of breath (cough, shortness of breath or wheezing.). 1 Inhaler 11  . amLODipine (NORVASC) 10 MG tablet TAKE 1 TABLET BY MOUTH DAILY. FOR BLOOD PRESSURE. 30 tablet 6  . metoprolol tartrate (LOPRESSOR) 25 MG tablet Take 1 tablet (25 mg total) by mouth 2 (two) times daily. 60 tablet 6  . cefdinir (OMNICEF) 300 MG capsule Take 2 capsules (600 mg total) by mouth daily. (Patient not taking: Reported on 10/07/2018) 20 capsule 0  . cholecalciferol (VITAMIN D) 1000 UNITS tablet Take 1 tablet (1,000 Units total) by mouth daily. (Patient not taking: Reported on 10/07/2018) 90 tablet 3  . predniSONE (DELTASONE) 50 MG tablet One daily (Patient not taking: Reported on  10/07/2018) 4 tablet 0   No facility-administered medications prior to visit.      ROS Review of Systems  Constitutional: Negative for activity change, appetite change and fatigue.  HENT: Negative for congestion, sinus pressure and sore throat.   Eyes: Positive for redness. Negative for visual disturbance.  Respiratory: Negative for cough, chest tightness, shortness of breath and wheezing.   Cardiovascular: Negative for chest pain and palpitations.  Gastrointestinal: Negative for abdominal distention, abdominal pain and constipation.   Endocrine: Negative for polydipsia.  Genitourinary: Negative for dysuria and frequency.  Musculoskeletal: Negative for arthralgias and back pain.  Skin: Negative for rash.  Neurological: Negative for tremors, light-headedness and numbness.  Hematological: Does not bruise/bleed easily.  Psychiatric/Behavioral: Negative for agitation and behavioral problems.    Objective:  BP (!) 142/94   Pulse 74   Temp 98 F (36.7 C) (Oral)   Ht 5\' 8"  (1.727 m)   Wt 213 lb 3.2 oz (96.7 kg)   SpO2 99%   BMI 32.42 kg/m   BP/Weight 10/07/2018 09/03/2018 2/35/5732  Systolic BP 202 542 706  Diastolic BP 94 98 98  Wt. (Lbs) 213.2 211 -  BMI 32.42 32.08 -      Physical Exam Constitutional:      Appearance: She is well-developed.  Eyes:     Comments: Left eye lateral subconjunctival hemorrhage  Cardiovascular:     Rate and Rhythm: Normal rate.     Heart sounds: Normal heart sounds. No murmur.  Pulmonary:     Effort: Pulmonary effort is normal.     Breath sounds: Normal breath sounds. No wheezing or rales.  Chest:     Chest wall: No tenderness.  Abdominal:     General: Bowel sounds are normal. There is no distension.     Palpations: Abdomen is soft. There is no mass.     Tenderness: There is no abdominal tenderness.  Musculoskeletal: Normal range of motion.  Neurological:     Mental Status: She is alert and oriented to person, place, and time.     CMP Latest Ref Rng & Units 05/18/2018 12/11/2017 12/02/2017  Glucose 65 - 99 mg/dL 92 76 86  BUN 6 - 24 mg/dL 15 12 11   Creatinine 0.57 - 1.00 mg/dL 0.91 1.02(H) 0.95  Sodium 134 - 144 mmol/L 143 143 141  Potassium 3.5 - 5.2 mmol/L 3.8 3.6 3.1(L)  Chloride 96 - 106 mmol/L 104 105 105  CO2 20 - 29 mmol/L 25 23 24   Calcium 8.7 - 10.2 mg/dL 9.2 9.2 9.6  Total Protein 6.0 - 8.5 g/dL 6.7 6.6 -  Total Bilirubin 0.0 - 1.2 mg/dL 0.2 0.3 -  Alkaline Phos 39 - 117 IU/L 63 48 -  AST 0 - 40 IU/L 14 12 -  ALT 0 - 32 IU/L 14 16 -    Lipid Panel       Component Value Date/Time   CHOL 160 12/11/2017 1019   TRIG 58 12/11/2017 1019   HDL 46 12/11/2017 1019   CHOLHDL 3.5 12/11/2017 1019   CHOLHDL 2.6 01/16/2015 1041   VLDL 9 01/16/2015 1041   LDLCALC 102 (H) 12/11/2017 1019    CBC    Component Value Date/Time   WBC 5.4 12/02/2017 1152   RBC 3.81 (L) 12/02/2017 1152   HGB 11.6 (L) 12/02/2017 1152   HGB 13.3 10/13/2017 1346   HCT 34.8 (L) 12/02/2017 1152   HCT 38.5 10/13/2017 1346   PLT 159 12/02/2017 1152   PLT 167 10/13/2017  1346   MCV 91.3 12/02/2017 1152   MCV 90 10/13/2017 1346   MCH 30.4 12/02/2017 1152   MCHC 33.3 12/02/2017 1152   RDW 12.7 12/02/2017 1152   RDW 13.6 10/13/2017 1346   LYMPHSABS 1.2 12/02/2017 1152   LYMPHSABS 1.1 05/07/2017 1120   MONOABS 0.3 12/02/2017 1152   EOSABS 0.3 12/02/2017 1152   EOSABS 0.2 05/07/2017 1120   BASOSABS 0.0 12/02/2017 1152   BASOSABS 0.0 05/07/2017 1120    Lab Results  Component Value Date   HGBA1C 4.5 05/31/2014    Assessment & Plan:   1. Essential hypertension, benign Slightly elevated above goal No regimen change today Counseled on blood pressure goal of less than 130/80, low-sodium, DASH diet, medication compliance, 150 minutes of moderate intensity exercise per week. Discussed medication compliance, adverse effects. - amLODipine (NORVASC) 10 MG tablet; TAKE 1 TABLET BY MOUTH DAILY. FOR BLOOD PRESSURE.  Dispense: 30 tablet; Refill: 6 - metoprolol tartrate (LOPRESSOR) 25 MG tablet; Take 1 tablet (25 mg total) by mouth 2 (two) times daily.  Dispense: 60 tablet; Refill: 6  2. 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 Subconjunctival hemorrhage of left eye Reassured   Meds ordered this encounter   Medications  . amLODipine (NORVASC) 10 MG tablet    Sig: TAKE 1 TABLET BY MOUTH DAILY. FOR BLOOD PRESSURE.    Dispense:  30 tablet    Refill:  6  . metoprolol tartrate (LOPRESSOR) 25 MG tablet    Sig: Take 1 tablet (25 mg total) by mouth 2 (two) times daily.    Dispense:  60 tablet    Refill:  6    Follow-up: Return in about 3 months (around 01/07/2019) for Follow-up on chronic medical conditions.       Charlott Rakes, MD, FAAFP. [facility] [place], [place]   10/08/2018, 8:09 AM

## 2018-10-26 ENCOUNTER — Telehealth: Payer: Self-pay | Admitting: Family Medicine

## 2018-10-26 NOTE — Telephone Encounter (Signed)
Will route to PCP for review. 

## 2018-10-26 NOTE — Telephone Encounter (Signed)
Pt called in stating she would like a note stating she is a high risk due to her chronic diease and asthma for work she also states she lives with her mom who is 51 years old and doesn't want to take the risk of bringing anything back home to her please follow up

## 2018-10-27 NOTE — Telephone Encounter (Signed)
Will route to PCP for review. 

## 2018-10-27 NOTE — Telephone Encounter (Signed)
Her medical conditions do not put her in the high risk category.

## 2019-01-05 ENCOUNTER — Other Ambulatory Visit: Payer: Self-pay | Admitting: Family Medicine

## 2019-01-05 DIAGNOSIS — E038 Other specified hypothyroidism: Secondary | ICD-10-CM

## 2019-01-11 ENCOUNTER — Telehealth: Payer: Self-pay | Admitting: Family Medicine

## 2019-01-11 ENCOUNTER — Ambulatory Visit: Payer: Self-pay

## 2019-01-11 NOTE — Telephone Encounter (Signed)
Patients call taken.  Patient identified by name and date of birth.  Patient exposed to sister who is positive for COVID-19.    Patient advised to self quarrintine herself and her mother who was also exposed.  Patient has appointment this Wednesday.  Patient informed visit would be a tele visit and Doctor would proceed with testing at that time.  Patient given information about COVID-19 and transmission.  Patient acknowledged understanding of advice.

## 2019-01-11 NOTE — Telephone Encounter (Signed)
Patient called and asked about being tested for Covid, since she was around her sister in the car who just found out she was positive for Covid. The patient says she spoke to her PCP's office and has an appointment on Wednesday. I advised to follow the advice of her PCP and the visit is a telehealth visit, meaning over the phone or computer, she verbalized understanding.

## 2019-01-11 NOTE — Telephone Encounter (Signed)
Patient called stating her sister has the virus and they wore their mask around her but  wanted to get a referral to get tested. States she does not have any symptoms. Please follow up

## 2019-01-12 ENCOUNTER — Telehealth: Payer: Self-pay | Admitting: *Deleted

## 2019-01-12 ENCOUNTER — Telehealth: Payer: Self-pay | Admitting: General Practice

## 2019-01-12 NOTE — Telephone Encounter (Addendum)
Attempted to contact pt to schedule COVID testing; left message on voicemail.  ----- Message from Charlott Rakes, MD sent at 01/12/2019 12:58 PM EDT ----- Patient exposed to sister who is positive for COVID-19.  Asymptomatic, requests COVID-19 test   Primary Visit Coverage   Payer Plan Sponsor Code Group Number Group Name BLUE CROSS [place] OTHER  [id number] [place] Exchange Primary Visit Coverage Subscriber   ID Name [facility] Address [id number] [facility] [id number]    [place], [place] [address] Secondary Visit Coverage   Payer Plan Sponsor Code Group Number Group Name MEDICAID [facility] MEDICAID [place]-FAMILY PLANNING    Secondary Visit Coverage Subscriber   ID Name [facility] Address [id number]    [place], [place] [address]     Thank you, Dr [name].

## 2019-01-12 NOTE — Telephone Encounter (Signed)
-----   Message from Charlott Rakes, MD sent at 01/12/2019 12:58 PM EDT ----- Patient exposed to sister who is positive for COVID-19.  Asymptomatic, requests COVID-19 test   Primary Visit Coverage   Payer Plan Sponsor Code Group Number Group Name BLUE CROSS [place] OTHER  [id number] [place] Exchange Primary Visit Coverage Subscriber   ID Name [facility] Address [id number] [facility] [id number]    [place], [place] [address] Secondary Visit Coverage   Payer Plan Sponsor Code Group Number Group Name MEDICAID [facility] MEDICAID [place]-FAMILY PLANNING    Secondary Visit Coverage Subscriber   ID Name [facility] Address [id number] [id number]    [place], [place] [address]     Thank you, Dr [name].

## 2019-01-12 NOTE — Telephone Encounter (Signed)
Attempted to call pt to schedule COVID-19 test as requested by Dr. [name].   Left message for her to call us back at [phone] any nurse could get her scheduled.

## 2019-01-12 NOTE — Telephone Encounter (Signed)
Called pt, lvm to return call to schedule Covid testing.

## 2019-01-12 NOTE — Telephone Encounter (Signed)
Test ordered; [facility] will be in touch. She needs to keep her appointment with me.

## 2019-01-13 ENCOUNTER — Other Ambulatory Visit: Payer: BLUE CROSS/BLUE SHIELD

## 2019-01-13 ENCOUNTER — Telehealth: Payer: Self-pay | Admitting: *Deleted

## 2019-01-13 ENCOUNTER — Encounter: Payer: Self-pay | Admitting: Family Medicine

## 2019-01-13 ENCOUNTER — Other Ambulatory Visit: Payer: Self-pay

## 2019-01-13 ENCOUNTER — Ambulatory Visit: Payer: BLUE CROSS/BLUE SHIELD | Attending: Family Medicine | Admitting: Family Medicine

## 2019-01-13 DIAGNOSIS — M25551 Pain in right hip: Secondary | ICD-10-CM | POA: Diagnosis not present

## 2019-01-13 DIAGNOSIS — I10 Essential (primary) hypertension: Secondary | ICD-10-CM

## 2019-01-13 DIAGNOSIS — Z20822 Contact with and (suspected) exposure to covid-19: Secondary | ICD-10-CM

## 2019-01-13 DIAGNOSIS — M25562 Pain in left knee: Secondary | ICD-10-CM | POA: Diagnosis not present

## 2019-01-13 DIAGNOSIS — G8929 Other chronic pain: Secondary | ICD-10-CM

## 2019-01-13 DIAGNOSIS — M545 Low back pain: Secondary | ICD-10-CM

## 2019-01-13 DIAGNOSIS — H359 Unspecified retinal disorder: Secondary | ICD-10-CM | POA: Diagnosis not present

## 2019-01-13 MED ORDER — METHOCARBAMOL 500 MG PO TABS: 500.0000 mg | ORAL_TABLET | Freq: Three times a day (TID) | ORAL | 1 refills | Status: DC | PRN

## 2019-01-13 NOTE — Progress Notes (Signed)
Patient has been called and DOB has been verified. Patient has been screened and transferred to PCP to start phone visit.   Patient is having knee, back and hip pain.

## 2019-01-13 NOTE — Telephone Encounter (Signed)
Nurse called the patient's home phone number but received no answer and message was left on the voicemail for the patient to call back.  Return phone number given. 

## 2019-01-13 NOTE — Telephone Encounter (Signed)
Pt scheduled for covid -19 testing at [facility] site today at 1200.  Requested by Dr. [name]; possible exposure  Reviewed testing process; stay in car, wear mask as well as any passengers in vehicle, pt verbalizes understanding.

## 2019-01-13 NOTE — Progress Notes (Signed)
Virtual Visit via Telephone Note  I connected with Heidi Barrett, on 01/13/2019 at 4:05 PM by telephone due to the COVID-19 pandemic and verified that I am speaking with the correct person using two identifiers.   Consent: I discussed the limitations, risks, security and privacy concerns of performing an evaluation and management service by telephone and the availability of in person appointments. I also discussed with the patient that there may be a patient responsible charge related to this service. The patient expressed understanding and agreed to proceed.   Location of Patient: Home  Location of Provider: Clinic   Persons participating in Telemedicine visit: Heidi Barrett-CMA Dr. Felecia Shelling     History of Present Illness: Heidi Barrett is a 53 year old female with a history of hypertension, menorrhagia secondary to fibroids, hypothyroidism who presents today for follow-up visit. She has had worsening low back pain, left knee pain and right hip pain over the last couple of weeks.  She has been doing heavy lifting at her job and symptoms improved slightly with ibuprofen but then returned when the medication wears off.  Her back pain does not radiate and is described as moderate. She also endorses recent weight gain. She has been compliant with her antihypertensive and her blood pressure at home checked this morning was 130/70.  She is awaiting results of a COVID-19 test as she had been exposed to her sister who tested positive for COVID-19 3 days ago.  She was in the same car with her sister 3 days ago but states she had a mask on.  She denies myalgias, fever, dyspnea, cough, abnormal taste in her mouth and has been in self quarantine.   Past Medical History:  Diagnosis Date  . Anemia   . Hypertension   . Thyroid disease    Allergies  Allergen Reactions  . Hctz [Hydrochlorothiazide] Shortness Of Breath and Other (See Comments)    wheezing  .  Tramadol Anaphylaxis and Swelling  . Hydrocodone Nausea And Vomiting    dizzines  . Percocet [Oxycodone-Acetaminophen] Nausea And Vomiting and Other (See Comments)    weakness    Current Outpatient Medications on File Prior to Visit  Medication Sig Dispense Refill  . acetaminophen (TYLENOL) 325 MG tablet Take 650 mg by mouth every 6 (six) hours as needed for mild pain.    Marland Kitchen albuterol (PROVENTIL HFA) 108 (90 Base) MCG/ACT inhaler Inhale 2 puffs into the lungs every 4 (four) hours as needed for wheezing or shortness of breath. 6.7 g 2  . amLODipine (NORVASC) 10 MG tablet TAKE 1 TABLET BY MOUTH DAILY. FOR BLOOD PRESSURE. 30 tablet 6  . calcium carbonate (CALCIUM 600) 600 MG TABS tablet Take 1 tablet (600 mg total) by mouth daily with breakfast. 60 tablet 3  . fluticasone (FLONASE) 50 MCG/ACT nasal spray Place 2 sprays into both nostrils daily. 16 g 12  . ibuprofen (ADVIL,MOTRIN) 600 MG tablet Take 1 tablet (600 mg total) by mouth every 8 (eight) hours as needed. 60 tablet 1  . levothyroxine (SYNTHROID) 150 MCG tablet TAKE 1 TABLET (150 MCG TOTAL) BY MOUTH DAILY BEFORE BREAKFAST. 30 tablet 2  . metoprolol tartrate (LOPRESSOR) 25 MG tablet Take 1 tablet (25 mg total) by mouth 2 (two) times daily. 60 tablet 6  . Multiple Vitamin (MULTIVITAMIN WITH MINERALS) TABS tablet Take 1 tablet by mouth daily. 90 tablet 3  . cefdinir (OMNICEF) 300 MG capsule Take 2 capsules (600 mg total) by mouth daily. (Patient not taking: Reported on 10/07/2018)  20 capsule 0  . cholecalciferol (VITAMIN D) 1000 UNITS tablet Take 1 tablet (1,000 Units total) by mouth daily. (Patient not taking: Reported on 10/07/2018) 90 tablet 3  . predniSONE (DELTASONE) 50 MG tablet One daily (Patient not taking: Reported on 10/07/2018) 4 tablet 0   No current facility-administered medications on file prior to visit.     Observations/Objective: Awake, alert, oriented x3 Not in acute distress  Assessment and Plan: 1. Benign essential  HTN Controlled as per home blood pressure log Continue current regimen  2. Retina disorder - Ambulatory referral to Ophthalmology  3. Chronic pain of left knee Advised to use knee brace Continue ibuprofen Weight loss  4. Right hip pain History of right piriformis syndrome See #3 above PT will be beneficial  5. Chronic midline low back pain without sciatica Advised on weight loss Continue ibuprofen and Robaxin added to regimen - methocarbamol (ROBAXIN) 500 MG tablet; Take 1 tablet (500 mg total) by mouth every 8 (eight) hours as needed for muscle spasms.  Dispense: 90 tablet; Refill: 1 - Ambulatory referral to Physical Therapy  Advised to self quarantine until she receives a negative result from her COVID-19 screen.  Also to call back in the event that she becomes symptomatic.  Healthcare maintenance-due for mammogram and colonoscopy and will order this at her next in person visit.  Follow Up Instructions: 6 weeks   I discussed the assessment and treatment plan with the patient. The patient was provided an opportunity to ask questions and all were answered. The patient agreed with the plan and demonstrated an understanding of the instructions.   The patient was advised to call back or seek an in-person evaluation if the symptoms worsen or if the condition fails to improve as anticipated.     I provided 26 minutes total of non-face-to-face time during this encounter including median intraservice time, reviewing previous notes, labs, imaging, medications, management and patient verbalized understanding.     Charlott Rakes, MD, FAAFP. [facility], [place]   01/13/2019, 4:05 PM

## 2019-01-14 ENCOUNTER — Telehealth: Payer: Self-pay | Admitting: Physical Therapy

## 2019-01-14 NOTE — Telephone Encounter (Signed)
Left message for patient advising that we have received her referral for PT but we have to wait and schedule until after COVID-19 results are returned. Left call back number and encouraged her to contact us with any questions.  Heidi Barrett C. Chrissi Crow PT, DPT 01/14/19 10:32 AM

## 2019-01-15 LAB — NOVEL CORONAVIRUS, NAA: SARS-CoV-2, NAA: NOT DETECTED

## 2019-01-20 ENCOUNTER — Telehealth: Payer: Self-pay

## 2019-01-20 NOTE — Telephone Encounter (Signed)
Pt. Returned call and given negative COVID 19 results.

## 2019-02-15 ENCOUNTER — Encounter (HOSPITAL_COMMUNITY): Payer: Self-pay

## 2019-02-15 ENCOUNTER — Other Ambulatory Visit: Payer: Self-pay

## 2019-02-15 ENCOUNTER — Ambulatory Visit (HOSPITAL_COMMUNITY)
Admission: EM | Admit: 2019-02-15 | Discharge: 2019-02-15 | Disposition: A | Discharge status: Home / Self Care | Payer: BLUE CROSS/BLUE SHIELD | Attending: Emergency Medicine | Admitting: Emergency Medicine

## 2019-02-15 DIAGNOSIS — I10 Essential (primary) hypertension: Secondary | ICD-10-CM | POA: Diagnosis not present

## 2019-02-15 DIAGNOSIS — T7840XA Allergy, unspecified, initial encounter: Secondary | ICD-10-CM

## 2019-02-15 MED ORDER — METHYLPREDNISOLONE SODIUM SUCC 125 MG IJ SOLR
INTRAMUSCULAR | Status: AC
  Filled 2019-02-15: qty 2

## 2019-02-15 MED ORDER — METHYLPREDNISOLONE SODIUM SUCC 125 MG IJ SOLR
125.0000 mg | Freq: Once | INTRAMUSCULAR | Status: AC
  Administered 2019-02-15: 125 mg via INTRAMUSCULAR

## 2019-02-15 NOTE — Discharge Instructions (Signed)
You were given a steroid shot this evening called Solu-Medrol.  Take Benadryl 1 to 2 tablets at bedtime.  You can take Benadryl every 6 hours as needed.   Stop taking the prescribed medication, muscle relaxer.    Call 911 or go to the emergency department if you develop difficulty swallowing or breathing.

## 2019-02-15 NOTE — ED Triage Notes (Signed)
Patient presents to Urgent Care with complaints of allergic reaction to some new medicine she was just given. Patient reports her throat feels like it is getting smaller and her tongue feels strange, pt states this started this morning, has had allergic reactions in the past and had to be given epi.

## 2019-02-15 NOTE — ED Provider Notes (Signed)
[place]    CSN: [id number] Arrival date & time: 02/15/19  1949     History   Chief Complaint Chief Complaint  Patient presents with  . Allergic Reaction    HPI Heidi Barrett is a 53 y.o. female.   Patient presents with "allergic reaction" to newly prescribed muscle relaxer; last dose taken this morning.  She states she has taken the new muscle relaxer x2 days.  She reports feeling like her tongue is swollen, her throat is "closing", and she is having difficulty swallowing.  She states she has a history of allergic reactions to various medications.  She denies shortness of breath or difficulty breathing.  The history is provided by the patient.    Past Medical History:  Diagnosis Date  . Anemia   . Hypertension   . Thyroid disease     Patient Active Problem List   Diagnosis Date Noted  . Palpitation 09/10/2017  . Atypical chest pain 07/12/2016  . Generalized anxiety disorder 07/04/2016  . Right lower quadrant abdominal pain 02/08/2016  . Eye muscle twitches 02/08/2016  . Fibroid, uterine 02/08/2016  . Piriformis syndrome of right side 01/31/2015  . Allergic reaction caused by a drug 01/27/2015  . Heel spur 01/16/2015  . Essential hypertension, benign 01/16/2015  . Benign essential HTN 05/31/2014  . Plantar fasciitis 05/31/2014  . Other specified hypothyroidism 05/31/2014  . Intramural leiomyoma of uterus 05/31/2014  . Menorrhagia 01/04/2013  . Fibroids 01/04/2013    Past Surgical History:  Procedure Laterality Date  . IR GENERIC HISTORICAL  05/28/2016   IR RADIOLOGIST EVAL & MGMT 05/28/2016 Markus Daft, MD [facility] INTERV RAD  . IR RADIOLOGIST EVAL & MGMT  03/06/2017  . IR RADIOLOGIST EVAL & MGMT  02/17/2018    OB History    Gravida  0   Para      Term      Preterm      AB      Living        SAB      TAB      Ectopic      Multiple      Live Births               Home Medications    Prior to Admission medications    Medication Sig Start Date End Date Taking? Authorizing Provider  acetaminophen (TYLENOL) 325 MG tablet Take 650 mg by mouth every 6 (six) hours as needed for mild pain.    [provider]  albuterol (PROVENTIL HFA) 108 (90 Base) MCG/ACT inhaler Inhale 2 puffs into the lungs every 4 (four) hours as needed for wheezing or shortness of breath. 10/07/18   Charlott Rakes, MD  amLODipine (NORVASC) 10 MG tablet TAKE 1 TABLET BY MOUTH DAILY. FOR BLOOD PRESSURE. 10/07/18   Charlott Rakes, MD  calcium carbonate (CALCIUM 600) 600 MG TABS tablet Take 1 tablet (600 mg total) by mouth daily with breakfast. 05/31/14   [name], MD  cefdinir (OMNICEF) 300 MG capsule Take 2 capsules (600 mg total) by mouth daily. Patient not taking: Reported on 10/07/2018 08/26/18   Robyn Haber, MD  cholecalciferol (VITAMIN D) 1000 UNITS tablet Take 1 tablet (1,000 Units total) by mouth daily. Patient not taking: Reported on 10/07/2018 05/31/14   [name], MD  fluticasone (FLONASE) 50 MCG/ACT nasal spray Place 2 sprays into both nostrils daily. 08/26/18   Robyn Haber, MD  ibuprofen (ADVIL,MOTRIN) 600 MG tablet Take 1 tablet (600  mg total) by mouth every 8 (eight) hours as needed. 12/10/17   Charlott Rakes, MD  levothyroxine (SYNTHROID) 150 MCG tablet TAKE 1 TABLET (150 MCG TOTAL) BY MOUTH DAILY BEFORE BREAKFAST. 01/05/19   Charlott Rakes, MD  methocarbamol (ROBAXIN) 500 MG tablet Take 1 tablet (500 mg total) by mouth every 8 (eight) hours as needed for muscle spasms. 01/13/19   Charlott Rakes, MD  metoprolol tartrate (LOPRESSOR) 25 MG tablet Take 1 tablet (25 mg total) by mouth 2 (two) times daily. 10/07/18   Charlott Rakes, MD  Multiple Vitamin (MULTIVITAMIN WITH MINERALS) TABS tablet Take 1 tablet by mouth daily. 05/31/14   [name], MD  predniSONE (DELTASONE) 50 MG tablet One daily Patient not taking: Reported on 10/07/2018 08/26/18   Robyn Haber, MD    Family History Family  History  Problem Relation Age of Onset  . Heart disease Mother   . Stroke Mother   . Cancer Father        prostate    Social History Social History   Tobacco Use  . Smoking status: Never Smoker  . Smokeless tobacco: Never Used  Substance Use Topics  . Alcohol use: No  . Drug use: No     Allergies   Hctz [hydrochlorothiazide], Tramadol, Hydrocodone, and Percocet [oxycodone-acetaminophen]   Review of Systems Review of Systems  Constitutional: Negative for chills and fever.  HENT: Positive for trouble swallowing. Negative for ear pain and sore throat.   Eyes: Negative for pain and visual disturbance.  Respiratory: Negative for cough, shortness of breath, wheezing and stridor.   Cardiovascular: Negative for chest pain and palpitations.  Gastrointestinal: Negative for abdominal pain and vomiting.  Genitourinary: Negative for dysuria and hematuria.  Musculoskeletal: Negative for arthralgias and back pain.  Skin: Negative for color change and rash.  Neurological: Negative for seizures and syncope.  All other systems reviewed and are negative.    Physical Exam Triage Vital Signs ED Triage Vitals  Enc Vitals Group     BP 02/15/19 2028 (!) 147/97     Pulse Rate 02/15/19 2028 73     Resp 02/15/19 2028 17     Temp 02/15/19 2028 98.3 F (36.8 C)     Temp Source 02/15/19 2028 Oral     SpO2 02/15/19 2028 100 %     Weight --      Height --      Head Circumference --      Peak Flow --      Pain Score 02/15/19 2027 0     Pain Loc --      Pain Edu? --      Excl. in [place]? --    No data found.  Updated Vital Signs BP (!) 147/97 (BP Location: Right Arm)   Pulse 73   Temp 98.3 F (36.8 C) (Oral)   Resp 17   SpO2 100%   Visual Acuity Right Eye Distance:   Left Eye Distance:   Bilateral Distance:    Right Eye Near:   Left Eye Near:    Bilateral Near:     Physical Exam Vitals signs and nursing note reviewed.  Constitutional:      General: She is not in acute  distress.    Appearance: She is well-developed.     Comments: Well-appearing and in no distress.  HENT:     Head: Normocephalic and atraumatic.     Nose: Nose normal.     Mouth/Throat:     Mouth: Mucous membranes  are moist.     Pharynx: Oropharynx is clear.     Comments: No edema to tongue or throat. No difficulty swallowing.  Eyes:     Conjunctiva/sclera: Conjunctivae normal.  Neck:     Musculoskeletal: Neck supple.  Cardiovascular:     Rate and Rhythm: Normal rate and regular rhythm.     Heart sounds: No murmur.  Pulmonary:     Effort: Pulmonary effort is normal. No respiratory distress.     Breath sounds: Normal breath sounds. No stridor. No wheezing.     Comments: Moving good air. No wheezes or stridor.  Abdominal:     Palpations: Abdomen is soft.     Tenderness: There is no abdominal tenderness.  Skin:    General: Skin is warm and dry.  Neurological:     Mental Status: She is alert.      UC Treatments / Results  Labs (all labs ordered are listed, but only abnormal results are displayed) Labs Reviewed - No data to display  EKG   Radiology No results found.  Procedures Procedures (including critical care time)  Medications Ordered in UC Medications  methylPREDNISolone sodium succinate (SOLU-MEDROL) 125 mg/2 mL injection 125 mg (125 mg Intramuscular Given 02/15/19 2050)  methylPREDNISolone sodium succinate (SOLU-MEDROL) 125 mg/2 mL injection (has no administration in time range)    Initial Impression / Assessment and Plan / UC Course  I have reviewed the triage vital signs and the nursing notes.  Pertinent labs & imaging results that were available during my care of the patient were reviewed by me and considered in my medical decision making (see chart for details).   Allergic reaction to newly prescribed muscle relaxer.  Given Solu-Medrol here.  Instructed patient to take Benadryl 2 tablets at bedtime and then every 6 hours as needed.  Instructed patient to  stop taking the newly prescribed medication.  Strict instructions given to patient to call 911 if she develops difficulty swallowing or breathing.  Discussed case and plan of care with Dr. Lanny Cramp; he agrees.    Final Clinical Impressions(s) / UC Diagnoses   Final diagnoses:  Allergic reaction, initial encounter     Discharge Instructions     You were given a steroid shot this evening called Solu-Medrol.  Take Benadryl 1 to 2 tablets at bedtime.  You can take Benadryl every 6 hours as needed.   Stop taking the prescribed medication, muscle relaxer.    Call 911 or go to the emergency department if you develop difficulty swallowing or breathing.       ED Prescriptions    None     Controlled Substance Prescriptions [place] Controlled Substance Registry consulted? Not Applicable   Sharion Balloon, NP 02/15/19 2054

## 2019-02-17 ENCOUNTER — Telehealth: Payer: Self-pay | Admitting: Family Medicine

## 2019-02-17 NOTE — Telephone Encounter (Signed)
Patient called stating that she believes she had a allergic reaction and had to go to the UC. The medication she believes that cause the issue is methocarbamol.patient states that she believes something is causing her BP to get high. And has discontinued the medication.patient would like to know what she is to do now in regards to this.  Patient would like to know if its okay to increase her metoprolol.  Advised to go to the UC or ED if her symptoms worsen and she needs to seek immediate attention   Please follow up.

## 2019-02-18 NOTE — Telephone Encounter (Signed)
Nurse called the patient's home phone number but received no answer and message was left on the voicemail for the patient to call back.  Return phone number given. 

## 2019-02-19 NOTE — Telephone Encounter (Signed)
Patients call returned.  Patient identified by name and date of birth.  Patient states's she had and allergic reaction to her muscle relaxer that made her throat swell and that she had shortness of breath.  Patient went to UC who told her to stop medication.  Patient concerned as BP became elevated at 130/90. UC advised her not to that any other medication prior to consulting PCP.  This advised was reinforced and explained.   Patient advised to take medication as normal and monitor blood pressure.  If blood pressure rose too high to go to Emergency Room for evaluation.    Patient advised to keep appointment on 7/23.  Patient acknowledged understanding of advice.

## 2019-02-25 ENCOUNTER — Ambulatory Visit: Payer: BLUE CROSS/BLUE SHIELD | Admitting: Family Medicine

## 2019-02-25 ENCOUNTER — Ambulatory Visit: Payer: BLUE CROSS/BLUE SHIELD | Attending: Family Medicine | Admitting: Family Medicine

## 2019-02-25 ENCOUNTER — Encounter: Payer: Self-pay | Admitting: Family Medicine

## 2019-02-25 ENCOUNTER — Other Ambulatory Visit: Payer: Self-pay

## 2019-02-25 VITALS — BP 134/84 | HR 70 | Temp 98.7°F | Ht 68.0 in | Wt 216.0 lb

## 2019-02-25 DIAGNOSIS — Z09 Encounter for follow-up examination after completed treatment for conditions other than malignant neoplasm: Secondary | ICD-10-CM | POA: Diagnosis not present

## 2019-02-25 DIAGNOSIS — T7840XA Allergy, unspecified, initial encounter: Secondary | ICD-10-CM | POA: Diagnosis not present

## 2019-02-25 DIAGNOSIS — J309 Allergic rhinitis, unspecified: Secondary | ICD-10-CM | POA: Diagnosis not present

## 2019-02-25 MED ORDER — EPINEPHRINE 0.3 MG/0.3ML IJ SOAJ: 0.3000 mg | INTRAMUSCULAR | 99 refills | Status: DC | PRN

## 2019-02-25 NOTE — Progress Notes (Signed)
Established Patient Office Visit  Subjective:  Patient ID: Heidi Barrett, female    DOB: 09-30-1965  Age: 53 y.o. MRN: [id number]  CC:  Chief Complaint  Patient presents with  . Hypertension    HPI Heidi Barrett presents for follow-up after emergency department visit on 02/15/2019 due to possible. allergic reaction to a newly prescribed muscle relaxer.  Patient reported feeling as if her tongue was swollen and that her throat was closing and difficulty with swallowing.  Physical examination in the emergency department did not reveal any swelling of the tongue, throat, lips or mouth per ED notes  She was treated with Solu-Medrol 125 mg injection and told to discontinue use of the medication and take Benadryl at bedtime and then if needed. She reports that she did try the benadryl but this made her feel funny.   Past Medical History:  Diagnosis Date  . Anemia   . Hypertension   . Thyroid disease     Past Surgical History:  Procedure Laterality Date  . IR GENERIC HISTORICAL  05/28/2016   IR RADIOLOGIST EVAL & MGMT 05/28/2016 Markus Daft, MD [facility] INTERV RAD  . IR RADIOLOGIST EVAL & MGMT  03/06/2017  . IR RADIOLOGIST EVAL & MGMT  02/17/2018    Family History  Problem Relation Age of Onset  . Heart disease Mother   . Stroke Mother   . Cancer Father        prostate    Social History   Socioeconomic History  . Marital status: Single    Spouse name: Not on file  . Number of children: Not on file  . Years of education: Not on file  . Highest education level: Not on file  Occupational History  . Not on file  Social Needs  . Financial resource strain: Not on file  . Food insecurity    Worry: Not on file    Inability: Not on file  . Transportation needs    Medical: Not on file    Non-medical: Not on file  Tobacco Use  . Smoking status: Never Smoker  . Smokeless tobacco: Never Used  Substance and Sexual Activity  . Alcohol use: No  . Drug use: No  . Sexual  activity: Never    Birth control/protection: None  Lifestyle  . Physical activity    Days per week: Not on file    Minutes per session: Not on file  . Stress: Not on file  Relationships  . Social Herbalist on phone: Not on file    Gets together: Not on file    Attends religious service: Not on file    Active member of club or organization: Not on file    Attends meetings of clubs or organizations: Not on file    Relationship status: Not on file  . Intimate partner violence    Fear of current or ex partner: Not on file    Emotionally abused: Not on file    Physically abused: Not on file    Forced sexual activity: Not on file  Other Topics Concern  . Not on file  Social History Narrative  . Not on file    Outpatient Medications Prior to Visit  Medication Sig Dispense Refill  . acetaminophen (TYLENOL) 325 MG tablet Take 650 mg by mouth every 6 (six) hours as needed for mild pain.    Marland Kitchen albuterol (PROVENTIL HFA) 108 (90 Base) MCG/ACT inhaler Inhale 2 puffs into the lungs every 4 (four)  hours as needed for wheezing or shortness of breath. 6.7 g 2  . amLODipine (NORVASC) 10 MG tablet TAKE 1 TABLET BY MOUTH DAILY. FOR BLOOD PRESSURE. 30 tablet 6  . calcium carbonate (CALCIUM 600) 600 MG TABS tablet Take 1 tablet (600 mg total) by mouth daily with breakfast. 60 tablet 3  . fluticasone (FLONASE) 50 MCG/ACT nasal spray Place 2 sprays into both nostrils daily. 16 g 12  . ibuprofen (ADVIL,MOTRIN) 600 MG tablet Take 1 tablet (600 mg total) by mouth every 8 (eight) hours as needed. 60 tablet 1  . levothyroxine (SYNTHROID) 150 MCG tablet TAKE 1 TABLET (150 MCG TOTAL) BY MOUTH DAILY BEFORE BREAKFAST. 30 tablet 2  . metoprolol tartrate (LOPRESSOR) 25 MG tablet Take 1 tablet (25 mg total) by mouth 2 (two) times daily. 60 tablet 6  . Multiple Vitamin (MULTIVITAMIN WITH MINERALS) TABS tablet Take 1 tablet by mouth daily. 90 tablet 3  . cefdinir (OMNICEF) 300 MG capsule Take 2 capsules (600  mg total) by mouth daily. (Patient not taking: Reported on 10/07/2018) 20 capsule 0  . cholecalciferol (VITAMIN D) 1000 UNITS tablet Take 1 tablet (1,000 Units total) by mouth daily. (Patient not taking: Reported on 10/07/2018) 90 tablet 3  . methocarbamol (ROBAXIN) 500 MG tablet Take 1 tablet (500 mg total) by mouth every 8 (eight) hours as needed for muscle spasms. (Patient not taking: Reported on 02/25/2019) 90 tablet 1  . predniSONE (DELTASONE) 50 MG tablet One daily (Patient not taking: Reported on 10/07/2018) 4 tablet 0   No facility-administered medications prior to visit.     Allergies  Allergen Reactions  . Hctz [Hydrochlorothiazide] Shortness Of Breath and Other (See Comments)    wheezing  . Tramadol Anaphylaxis and Swelling  . Hydrocodone Nausea And Vomiting    dizzines  . Percocet [Oxycodone-Acetaminophen] Nausea And Vomiting and Other (See Comments)    weakness    ROS Review of Systems  Constitutional: Positive for fatigue. Negative for chills and fever.  HENT: Positive for congestion. Negative for sore throat and trouble swallowing.   Eyes: Negative for photophobia and visual disturbance.  Respiratory: Negative for cough and shortness of breath.   Gastrointestinal: Negative for abdominal pain, constipation, diarrhea and nausea.  Endocrine: Negative for polydipsia, polyphagia and polyuria.  Genitourinary: Negative for dysuria and frequency.  Musculoskeletal: Negative for arthralgias and back pain.  Neurological: Negative for dizziness and headaches.  Hematological: Negative for adenopathy. Does not bruise/bleed easily.      Objective:    Physical Exam  Constitutional: She is oriented to person, place, and time. She appears well-developed and well-nourished.  HENT:  Right Ear: Hearing and ear canal normal.  Left Ear: Hearing, external ear and ear canal normal.  Nose: Mucosal edema and rhinorrhea present.  Mouth/Throat: Oropharynx is clear and moist and mucous membranes  are normal.  TM's dull  Neck: Normal range of motion. Neck supple.  Cardiovascular: Normal rate and regular rhythm.  Pulmonary/Chest: Effort normal and breath sounds normal.  Abdominal: Soft. There is no abdominal tenderness. There is no rebound and no guarding.  Musculoskeletal:        General: No tenderness or edema.  Lymphadenopathy:    She has no cervical adenopathy.  Neurological: She is alert and oriented to person, place, and time.  Skin: Skin is warm and dry.  Psychiatric: She has a normal mood and affect. Her behavior is normal.  Nursing note and vitals reviewed.   BP 134/84   Pulse 70  Temp 98.7 F (37.1 C) (Oral)   Ht 5\' 8"  (1.727 m)   Wt 216 lb (98 kg)   SpO2 100%   BMI 32.84 kg/m  Wt Readings from Last 3 Encounters:  02/25/19 216 lb (98 kg)  10/07/18 213 lb 3.2 oz (96.7 kg)  09/03/18 211 lb (95.7 kg)     Health Maintenance Due  Topic Date Due  . MAMMOGRAM  01/11/2018  . COLONOSCOPY  01/11/2018    There are no preventive care reminders to display for this patient.  Lab Results  Component Value Date   TSH 3.560 10/07/2018   Lab Results  Component Value Date   WBC 5.4 12/02/2017   HGB 11.6 (L) 12/02/2017   HCT 34.8 (L) 12/02/2017   MCV 91.3 12/02/2017   PLT 159 12/02/2017   Lab Results  Component Value Date   NA 143 05/18/2018   K 3.8 05/18/2018   CO2 25 05/18/2018   GLUCOSE 92 05/18/2018   BUN 15 05/18/2018   CREATININE 0.91 05/18/2018   BILITOT 0.2 05/18/2018   ALKPHOS 63 05/18/2018   AST 14 05/18/2018   ALT 14 05/18/2018   PROT 6.7 05/18/2018   ALBUMIN 4.2 05/18/2018   CALCIUM 9.2 05/18/2018   ANIONGAP 12 12/02/2017   Lab Results  Component Value Date   CHOL 160 12/11/2017   Lab Results  Component Value Date   HDL 46 12/11/2017   Lab Results  Component Value Date   LDLCALC 102 (H) 12/11/2017   Lab Results  Component Value Date   TRIG 58 12/11/2017   Lab Results  Component Value Date   CHOLHDL 3.5 12/11/2017   Lab  Results  Component Value Date   HGBA1C 4.5 05/31/2014      Assessment & Plan:  1. Allergic reaction to drug, initial encounter; encounter for examination following treatment at a hospital She reports that she has had multiple reactions to different medications including her most recent reaction to Robaxin for which she was seen at the emergency department.  Patient's emergency department note was reviewed and Robaxin added to patient's allergies in chart.  Prescription provided for EpiPen in case of future severe allergic reaction. - EPINEPHrine 0.3 mg/0.3 mL IJ SOAJ injection; Inject 0.3 mLs (0.3 mg total) into the muscle as needed for anaphylaxis.  Dispense: 1 each; Refill: prn  2. Allergic rhinitis, unspecified seasonality, unspecified trigger Patient with evidence of allergic rhinitis on examination and patient complains of sensation of ear pressure at times.  Patient reports that she recently tried Benadryl after an allergic reaction but states that this caused her to feel funny.  Patient may wish to try over-the-counter Claritin/loratadine as she believes that she has taken this medication in the past without any issues.  An After Visit Summary was printed and given to the patient.   Follow-up: Return in about 3 months (around 05/28/2019) for chronic issues with Dr. [name]; 2-3 month follow-up.   Antony Blackbird, MD

## 2019-02-25 NOTE — Patient Instructions (Addendum)
You can obtain Debrox solution at a local store or pharmacy to help with ear wax build-up. Use per package instructions. If you have had no past issues with Claritin/loratadine then you may want to take this medication to help with your nasal congestion. A prescription will be sent to your pharmacy for an Epi-pen in case of a future severe allergic reaction. Always follow-up at the Emergency Department after any severe allergic reactions or use of an Epi-pen.  How to Use an Auto-Injector Pen An auto-injector pen (pre-filled automatic epinephrine injection device) is a device that is used to deliver epinephrine to the body. Epinephrine is a medicine that is given as a shot (injection). It works by relaxing the muscles in the airways and tightening the blood vessels. It is used to treat:  A life-threatening allergic reaction (anaphylaxis).  Serious breathing problems, such as severe asthma attacks, some lung problems, and other emergency conditions. An epinephrine injection can save your life. You should always carry an auto-injector pen with you if you are at risk for severe asthma attacks or anaphylaxis. You may hear other names for an auto-injector pen. They are epinephrine injection, epinephrine auto-injector pen, epinephrine pen, and automatic injection device. What are the risks? Using the auto-injector pen is safe. However, problems may arise, including:  Damage to bone or tissue. Make sure that you correctly place the needle in the muscle of your outer thigh as told by your health care provider. When should I use my auto-injector pen? Use your auto-injector pen as soon as you think you are experiencing anaphylaxis or a severe asthma attack. Anaphylaxis is very dangerous if it is not treated right away. Signs and symptoms of anaphylaxis may include:  Feeling warm in the face (flushed). This may include redness.  Itchy, red, swollen areas of skin (hives).  Swelling of the eyes, lips, face,  mouth, tongue, or throat.  Difficulty breathing, speaking, or swallowing.  Noisy breathing (wheezing).  Dizziness or light-headedness.  Fainting.  Pain or cramping in the abdomen.  Vomiting.  Diarrhea. These symptoms may represent a serious problem that is an emergency. Do not wait to see if the symptoms will go away. Use your auto-injector pen as you have been instructed, and get medical help right away. Call your local emergency services (911 in the [place] Do not drive yourself to the hospital. General tips for using an auto-injector pen   Use epinephrine exactly as told by your health care provider. Do not inject it more often or in greater or smaller doses than your health care provider told you. Most auto-injector pens contain one dose of epinephrine. Some contain two doses.  Use the auto-injector pen to give yourself an injection under your skin or into your muscle on the outer side of your thigh. Do not give yourself an injection into your buttocks or any other part of your body. ? In an emergency, you can use your auto-injector pen through your clothing. ? After you inject a dose of epinephrine, some liquid may remain in your auto-injector pen. This is normal.  If you need to give yourself a second dose of epinephrine, give the second injection in another area on your outer thigh. Do not give two injections in exactly the same place on your body. This can lead to tissue damage.  From time to time: ? Check the expiration date on your auto-injector pen. ? Check the solution to ensure that it is not cloudy and that there are no particles floating  in it. If your auto-injector pen is expired or if the solution is cloudy or has particles floating in it, throw it away and get a new one.  Ask your health care provider how to safely get rid of used or expired auto-injector pens.  Talk with your pharmacist or health care provider if you have questions about how to inject epinephrine  correctly. Get help right away if:  You inject epinephrine. You may need additional medical care, and you may need to be monitored for the side effects of epinephrine. The side effects include: ? Difficulty breathing. ? Fast or irregular heartbeat. ? Nausea or vomiting. ? Sweating. ? Dizziness. ? Nervousness or anxiety. ? Weakness. ? Pale skin. ? Headache. ? Shaking that does not stop. Summary  An auto-injector pen (pre-filled automatic epinephrine injection device) is a device that is used to deliver epinephrine to the body.  An auto-injector pen is used to treat a life-threatening allergic reaction (anaphylaxis), asthma attack, or other emergency conditions.  You should always carry an auto-injector pen with you if you are at risk for anaphylaxis or severe asthma attacks.  Use of this device is safe. However, bone or tissue damage can occur if you do not follow instructions for injecting the medicine.  Talk with your pharmacist or health care provider if you have questions about how to inject epinephrine correctly. This information is not intended to replace advice given to you by your health care provider. Make sure you discuss any questions you have with your health care provider. Document Released: 07/19/2000 Document Revised: 09/02/2018 Document Reviewed: 09/02/2018 Elsevier Patient Education  2020 [employer].  Allergic Rhinitis, Adult Allergic rhinitis is a reaction to allergens in the air. Allergens are tiny specks (particles) in the air that cause your body to have an allergic reaction. This condition cannot be passed from person to person (is not contagious). Allergic rhinitis cannot be cured, but it can be controlled. There are two types of allergic rhinitis:  Seasonal. This type is also called hay fever. It happens only during certain times of the year.  Perennial. This type can happen at any time of the year. What are the causes? This condition may be caused  by:  Pollen from grasses, trees, and weeds.  House dust mites.  Pet dander.  Mold. What are the signs or symptoms? Symptoms of this condition include:  Sneezing.  Runny or stuffy nose (nasal congestion).  A lot of mucus in the back of the throat (postnasal drip).  Itchy nose.  Tearing of the eyes.  Trouble sleeping.  Being sleepy during day. How is this treated? There is no cure for this condition. You should avoid things that trigger your symptoms (allergens). Treatment can help to relieve symptoms. This may include:  Medicines that block allergy symptoms, such as antihistamines. These may be given as a shot, nasal spray, or pill.  Shots that are given until your body becomes less sensitive to the allergen (desensitization).  Stronger medicines, if all other treatments have not worked. Follow these instructions at home: Avoiding allergens   Find out what you are allergic to. Common allergens include smoke, dust, and pollen.  Avoid them if you can. These are some of the things that you can do to avoid allergens: ? Replace carpet with wood, tile, or vinyl flooring. Carpet can trap dander and dust. ? Clean any mold found in the home. ? Do not smoke. Do not allow smoking in your home. ? Change your heating  and air conditioning filter at least once a month. ? During allergy season:  Keep windows closed as much as you can. If possible, use air conditioning when there is a lot of pollen in the air.  Use a special filter for allergies with your furnace and air conditioner.  Plan outdoor activities when pollen counts are lowest. This is usually during the early morning or evening hours.  If you do go outdoors when pollen count is high, wear a special mask for people with allergies.  When you come indoors, take a shower and change your clothes before sitting on furniture or bedding. General instructions  Do not use fans in your home.  Do not hang clothes outside to  dry.  Wear sunglasses to keep pollen out of your eyes.  Wash your hands right away after you touch household pets.  Take over-the-counter and prescription medicines only as told by your doctor.  Keep all follow-up visits as told by your doctor. This is important. Contact a doctor if:  You have a fever.  You have a cough that does not go away (is persistent).  You start to make whistling sounds when you breathe (wheeze).  Your symptoms do not get better with treatment.  You have thick fluid coming from your nose.  You start to have nosebleeds. Get help right away if:  Your tongue or your lips are swollen.  You have trouble breathing.  You feel dizzy or you feel like you are going to pass out (faint).  You have cold sweats. Summary  Allergic rhinitis is a reaction to allergens in the air.  This condition may be caused by allergens. These include pollen, dust mites, pet dander, and mold.  Symptoms include a runny, itchy nose, sneezing, or tearing eyes. You may also have trouble sleeping or feel sleepy during the day.  Treatment includes taking medicines and avoiding allergens. You may also get shots or take stronger medicines.  Get help if you have a fever or a cough that does not stop. Get help right away if you are short of breath. This information is not intended to replace advice given to you by your health care provider. Make sure you discuss any questions you have with your health care provider. Document Released: 11/21/2010 Document Revised: 11/10/2018 Document Reviewed: 02/10/2018 Elsevier Patient Education  2020 [employer].

## 2019-02-27 ENCOUNTER — Encounter: Payer: Self-pay | Admitting: Family Medicine

## 2019-04-06 ENCOUNTER — Other Ambulatory Visit: Payer: Self-pay

## 2019-04-06 ENCOUNTER — Ambulatory Visit: Payer: BLUE CROSS/BLUE SHIELD | Attending: Family Medicine | Admitting: Physical Therapy

## 2019-04-06 ENCOUNTER — Encounter: Payer: Self-pay | Admitting: Physical Therapy

## 2019-04-06 DIAGNOSIS — M5441 Lumbago with sciatica, right side: Secondary | ICD-10-CM | POA: Insufficient documentation

## 2019-04-06 DIAGNOSIS — R262 Difficulty in walking, not elsewhere classified: Secondary | ICD-10-CM | POA: Insufficient documentation

## 2019-04-06 DIAGNOSIS — M25562 Pain in left knee: Secondary | ICD-10-CM | POA: Diagnosis present

## 2019-04-06 DIAGNOSIS — G8929 Other chronic pain: Secondary | ICD-10-CM | POA: Diagnosis present

## 2019-04-06 DIAGNOSIS — M25551 Pain in right hip: Secondary | ICD-10-CM | POA: Diagnosis present

## 2019-04-06 NOTE — Patient Instructions (Addendum)
TENS UNIT: This is helpful for muscle pain and spasm.   Search and Purchase a TENS 7000 2nd edition at www.tenspros.com. It should be less than $30.     TENS unit instructions: Do not shower or bathe with the unit on Turn the unit off before removing electrodes or batteries If the electrodes lose stickiness add a drop of water to the electrodes after they are disconnected from the unit and place on plastic sheet. If you continued to have difficulty, call the TENS unit company to purchase more electrodes. Do not apply lotion on the skin area prior to use. Make sure the skin is clean and dry as this will help prolong the life of the electrodes. After use, always check skin for unusual red areas, rash or other skin difficulties. If there are any skin problems, does not apply electrodes to the same area. Never remove the electrodes from the unit by pulling the wires. Do not use the TENS unit or electrodes other than as directed. Do not change electrode placement without consultating your therapist or physician. Keep 2 fingers with between each electrode. Wear time ratio is 2:1, on to off times.    For example on for 30 minutes off for 15 minutes and then on for 30 minutes off for 15 minutes    Access Code: [email or web address]  URL: [email or web address]  Date: 04/06/2019  Prepared by: Elsie Ra   Exercises  Supine Piriformis Stretch - 3 reps - 1 sets - 30 hold - 2x daily - 6x weekly  Supine Lower Trunk Rotation - 2-3 reps - 1 sets - 10 hold - 2x daily - 6x weekly  Lying Prone with 1 Pillow - 1 reps - 1 sets - 3-5 lminutes hold - 2x daily - 6x weekly  Slump Stretch - 10 reps - 1-2 sets - 3 hold - 2x daily - 6x weekly

## 2019-04-06 NOTE — Therapy (Addendum)
[place], [place], [address] Phone: [phone]   Fax:  [phone]  Physical Therapy Evaluation  Patient Details  Name: Heidi Barrett MRN: [id number] Date of Birth: Aug 23, 1965 Referring Provider (PT): Charlott Rakes, MD   Encounter Date: 04/06/2019  PT End of Session - 04/06/19 1730    Visit Number  1    Number of Visits  12    Date for PT Re-Evaluation  05/18/19    Authorization Type  BCBS    PT Start Time  1620    PT Stop Time  1705    PT Time Calculation (min)  45 min    Activity Tolerance  Patient tolerated treatment well       Past Medical History:  Diagnosis Date  . Anemia   . Hypertension   . Thyroid disease     Past Surgical History:  Procedure Laterality Date  . IR GENERIC HISTORICAL  05/28/2016   IR RADIOLOGIST EVAL & MGMT 05/28/2016 Markus Daft, MD [facility] INTERV RAD  . IR RADIOLOGIST EVAL & MGMT  03/06/2017  . IR RADIOLOGIST EVAL & MGMT  02/17/2018    There were no vitals filed for this visit.   Subjective Assessment - 04/06/19 1625    Subjective  Pt relays she works in [employer] and she ran her hip into something back in March 2020 but never saw anyone for this due to Covid 19. Now she has been having Rt hip and back pain along with Lt knee pain that is getting worse. She is now using SPC to help offload her Rt side with walking. She has had PT in past (2016) and says that the TENS and IONTO really helped then.    Pertinent History  PMH: HTN, thyroid disease    How long can you sit comfortably?  19min    How long can you stand comfortably?  15 min    How long can you walk comfortably?  now having to use SPC to make it through the grocery store    Diagnostic tests  no lumbar or hip imaging    Currently in Pain?  Yes    Pain Score  9     Pain Location  Hip   and back   Pain Orientation  Right    Pain Descriptors / Indicators  Aching;Sharp;Dull    Pain Type  Chronic pain    Pain Radiating Towards  some numbness and burning in the Right leg    Pain Onset  More than a month ago    Pain Frequency  Constant    Aggravating Factors   sitting or standing too long, bending over or sidebending    Pain Relieving Factors  meds, laying down with pillow between legs         [facility] PT Assessment - 04/06/19 0001      Assessment   Medical Diagnosis  Chronic LBP, Rt hip pain/piriformis syndrome, Lt knee pain    Referring Provider (PT)  Charlott Rakes, MD    Onset Date/Surgical Date  --   new onset of pain since march 2020   Next MD Visit  some time this month    Prior Therapy  PT in 2016 for her Rt hip      Precautions   Precautions  None      Balance Screen   Has the patient fallen in the past 6 months  No      Home Environment   Living  Environment  Private residence    Additional Comments  3 steps to enter which are painful      Prior Function   Level of Independence  Independent    Vocation  --   leave of absence due to pain   Vocation Requirements  cafeteria work    Leisure  travel      Cognition   Overall Cognitive Status  Within Functional Limits for tasks assessed      Observation/Other Assessments   Focus on Therapeutic Outcomes (FOTO)   not done multiple body parts      ROM / Strength   AROM / PROM / Strength  AROM;Strength      AROM   Overall AROM Comments  Rt hip can flex to 100 deg only due to pain, Lt hip flexion WNL    AROM Assessment Site  Lumbar    Lumbar Flexion  50%    Lumbar Extension  10%    Lumbar - Right Side Bend  50%    Lumbar - Left Side Bend  50%    Lumbar - Right Rotation  50%    Lumbar - Left Rotation  50%      Strength   Overall Strength Comments  gross assessment tested in sitting, Rt hip overall 3/5 MMT limited by pain, Rt knee 4/5 MMT, Lt hip 4/5 overall, Lt knee 4+/5 MMT overall      Palpation   Palpation comment  very TTP in Rt lumbar, glutes, piriformis      Special Tests   Other special tests  + SLR and +  slump test      Transfers   Transfers  Independent with all Transfers   but needs extra time due to pain     Ambulation/Gait   Gait Comments  antalgic gait and uses cane in Rt hand for Rt sided hip pain (consider trying in other hand and show reciprocal step pattern next visti)                Objective measurements completed on examination: See above findings.      [place] Adult PT Treatment/Exercise - 04/06/19 0001      Exercises   Exercises  Other Exercises    Other Exercises   prone lying and prone lying over one pillow decreased pain some      Modalities   Modalities  Cryotherapy;Electrical Stimulation;Iontophoresis      Cryotherapy   Number Minutes Cryotherapy  10 Minutes    Cryotherapy Location  Lumbar Spine    Type of Cryotherapy  Ice pack      Electrical Stimulation   Electrical Stimulation Location  Rt lumbar and glutes    Electrical Stimulation Action  IFC pt prone over pillow    Electrical Stimulation Parameters  to tolerance    Electrical Stimulation Goals  Pain      Iontophoresis   Type of Iontophoresis  Dexamethasone    Location  Rt lumbar    Dose  1.0 CC dex    Time  wear home patch             PT Education - 04/06/19 1727    Education Details  HEP, POC, exam findings, TENS    Person(s) Educated  Patient    Methods  Explanation;Demonstration;Verbal cues;Handout    Comprehension  Verbalized understanding;Need further instruction          PT Long Term Goals - 04/06/19 1759      PT LONG TERM  GOAL #1   Title  Pt will be I and compliant with HEP. (Target goal for all goals 6 weeks 05/18/19/20)    Status  New      PT LONG TERM GOAL #2   Title  She will report less than 2-3/10 standing 60 min or more.    Baseline  15 min    Status  New      PT LONG TERM GOAL #3   Title  She will increaese lumbar ROM to [facility].    Status  New      PT LONG TERM GOAL #4   Title  she will increase Rt leg strength to at least 4+/5 overall    Status   New             Plan - 04/06/19 1733    Clinical Impression Statement  Pt presents with signs and symptoms consistent of Chronic Rt sided LBP with radiculopathy, and Rt hip pain, piriformis syndrome, and possible disc involvement with special testing today. She has overall decreased lumbar ROM, decreased leg and core strength, decreased activity tolerance particularly with standing or walking, and increased pain limiting her functional abilities. She will benefit from skilled PT to address her deficits.    Personal Factors and Comorbidities  Comorbidity 1;Comorbidity 2;Comorbidity 3+;Past/Current Experience;Fitness    Comorbidities  chronic LBP, PMH: HTN, thyroid disease, obesity    Examination-Activity Limitations  Bend;Caring for Others;Lift;Stand;Stairs;Squat    Examination-Participation Restrictions  Cleaning;Community Activity;Laundry;Meal Prep    Stability/Clinical Decision Making  Evolving/Moderate complexity    Clinical Decision Making  Moderate    Rehab Potential  Good    PT Frequency  2x / week    PT Duration  6 weeks    PT Treatment/Interventions  Aquatic Therapy;Cryotherapy;Electrical Stimulation;Iontophoresis 4mg /ml Dexamethasone;Moist Heat;Traction;Ultrasound;Gait training;Stair training;Therapeutic activities;Therapeutic exercise;Balance training;Neuromuscular re-education;Manual techniques;Passive range of motion;Taping;Spinal Manipulations;Joint Manipulations;Dry needling    PT Next Visit Plan  how was HEP?, she may disc pathology but has not had any imaging,  have favored extension with prone lying but hard to tell as has high irritability of symptoms    PT Home Exercise Plan  prone lying, piriformis stretch, slump stretch,LTR       Patient will benefit from skilled therapeutic intervention in order to improve the following deficits and impairments:  Abnormal gait, Decreased activity tolerance, Decreased strength, Decreased range of motion, Difficulty walking, Increased  muscle spasms, Obesity, Pain  Visit Diagnosis: Chronic right-sided low back pain with right-sided sciatica  Pain in right hip  Acute pain of left knee  Difficulty in walking, not elsewhere classified     Problem List Patient Active Problem List   Diagnosis Date Noted  . Palpitation 09/10/2017  . Atypical chest pain 07/12/2016  . Generalized anxiety disorder 07/04/2016  . Right lower quadrant abdominal pain 02/08/2016  . Eye muscle twitches 02/08/2016  . Fibroid, uterine 02/08/2016  . Piriformis syndrome of right side 01/31/2015  . Allergic reaction caused by a drug 01/27/2015  . Heel spur 01/16/2015  . Essential hypertension, benign 01/16/2015  . Benign essential HTN 05/31/2014  . Plantar fasciitis 05/31/2014  . Other specified hypothyroidism 05/31/2014  . Intramural leiomyoma of uterus 05/31/2014  . Menorrhagia 01/04/2013  . Fibroids 01/04/2013    [name] 04/06/2019, 6:02 PM  [facility] [address] [place], [place], [address] Phone: [phone]   Fax:  [phone]  Name: Kelsee Skeete MRN: [id number] Date of Birth: 05-31-1966

## 2019-04-16 ENCOUNTER — Other Ambulatory Visit: Payer: Self-pay | Admitting: Family Medicine

## 2019-04-16 DIAGNOSIS — E038 Other specified hypothyroidism: Secondary | ICD-10-CM

## 2019-04-20 ENCOUNTER — Other Ambulatory Visit: Payer: Self-pay

## 2019-04-20 ENCOUNTER — Ambulatory Visit: Payer: BLUE CROSS/BLUE SHIELD | Admitting: Physical Therapy

## 2019-04-20 DIAGNOSIS — M25551 Pain in right hip: Secondary | ICD-10-CM

## 2019-04-20 DIAGNOSIS — R262 Difficulty in walking, not elsewhere classified: Secondary | ICD-10-CM

## 2019-04-20 DIAGNOSIS — M25562 Pain in left knee: Secondary | ICD-10-CM

## 2019-04-20 DIAGNOSIS — G8929 Other chronic pain: Secondary | ICD-10-CM

## 2019-04-20 DIAGNOSIS — M5441 Lumbago with sciatica, right side: Secondary | ICD-10-CM | POA: Diagnosis not present

## 2019-04-20 NOTE — Therapy (Signed)
[place], [place], [address] Phone: [phone]   Fax:  [phone]  Physical Therapy Treatment  Patient Details  Name: Heidi Barrett MRN: [id number] Date of Birth: 10/27/1965 Referring Provider (PT): Charlott Rakes, MD   Encounter Date: 04/20/2019  PT End of Session - 04/20/19 1617    Visit Number  2    Number of Visits  12    Date for PT Re-Evaluation  05/18/19    Authorization Type  BCBS    PT Start Time  [id number]    PT Stop Time  1620    PT Time Calculation (min)  45 min    Activity Tolerance  Patient tolerated treatment well       Past Medical History:  Diagnosis Date  . Anemia   . Hypertension   . Thyroid disease     Past Surgical History:  Procedure Laterality Date  . IR GENERIC HISTORICAL  05/28/2016   IR RADIOLOGIST EVAL & MGMT 05/28/2016 Markus Daft, MD [facility] INTERV RAD  . IR RADIOLOGIST EVAL & MGMT  03/06/2017  . IR RADIOLOGIST EVAL & MGMT  02/17/2018    There were no vitals filed for this visit.  Subjective Assessment - 04/20/19 1605    Subjective  Pt relays she got a TENS unit and it is helping, the pain is much better in her lower back and Rt hip but is having new pain in her mid back on Rt side and her Rt scapula    Currently in Pain?  Yes    Pain Score  8     Pain Location  Thoracic    Pain Orientation  Right    Pain Descriptors / Indicators  Aching    Pain Type  Acute pain                       [facility] Adult PT Treatment/Exercise - 04/20/19 0001      Exercises   Other Exercises   prone lying over one pillow X 3 min, then SL lumbar-thoracic rotations 5 sec X 10 each side, standing rows and extensions red X 20 each, finished with nu step L3 for 5 min UE/LE       Modalities   Modalities  Electrical Stimulation;Moist Heat      Electrical Stimulation   Electrical Stimulation Location  Rt lumbar and thoracic, 15 minutes with 5 additional minutes for education  and setup for her home unit    Electrical Stimulation Action  IFC    Electrical Stimulation Parameters  tolerance    Electrical Stimulation Goals  Pain      Iontophoresis   Type of Iontophoresis  Dexamethasone    Location  Rt thoracic    Dose  1.0 CC dex    Time  wear home patch             PT Education - 04/20/19 1616    Education Details  TENS education and set up for her home unit    Person(s) Educated  Patient    Methods  Explanation;Demonstration;Verbal cues    Comprehension  Verbalized understanding;Returned demonstration          PT Long Term Goals - 04/06/19 1759      PT LONG TERM GOAL #1   Title  Pt will be I and compliant with HEP. (Target goal for all goals 6 weeks 05/18/19/20)    Status  New      PT LONG  TERM GOAL #2   Title  She will report less than 2-3/10 standing 60 min or more.    Baseline  15 min    Status  New      PT LONG TERM GOAL #3   Title  She will increaese lumbar ROM to [facility].    Status  New      PT LONG TERM GOAL #4   Title  she will increase Rt leg strength to at least 4+/5 overall    Status  New            Plan - 04/20/19 1617    Clinical Impression Statement  Pt had overall less pain in her back and relays compliance with HEP and bought a home TENS unit. She was shown how to set up and use her TENS unit today. This was followed by stretching and light exercise for her back and she finished up on the bike. Ionto applied for thoracic back pain she was having today.    Personal Factors and Comorbidities  Comorbidity 1;Comorbidity 2;Comorbidity 3+;Past/Current Experience;Fitness    Comorbidities  chronic LBP, PMH: HTN, thyroid disease, obesity    Examination-Activity Limitations  Bend;Caring for Others;Lift;Stand;Stairs;Squat    Examination-Participation Restrictions  Cleaning;Community Activity;Laundry;Meal Prep    Stability/Clinical Decision Making  Evolving/Moderate complexity    Rehab Potential  Good    PT Frequency  2x /  week    PT Duration  6 weeks    PT Treatment/Interventions  Aquatic Therapy;Cryotherapy;Electrical Stimulation;Iontophoresis 4mg /ml Dexamethasone;Moist Heat;Traction;Ultrasound;Gait training;Stair training;Therapeutic activities;Therapeutic exercise;Balance training;Neuromuscular re-education;Manual techniques;Passive range of motion;Taping;Spinal Manipulations;Joint Manipulations;Dry needling    PT Next Visit Plan  how was HEP?, she may disc pathology but has not had any imaging,  have favored extension with prone lying but hard to tell as has high irritability of symptoms    PT Home Exercise Plan  prone lying, piriformis stretch, slump stretch,LTR       Patient will benefit from skilled therapeutic intervention in order to improve the following deficits and impairments:  Abnormal gait, Decreased activity tolerance, Decreased strength, Decreased range of motion, Difficulty walking, Increased muscle spasms, Obesity, Pain  Visit Diagnosis: Chronic right-sided low back pain with right-sided sciatica  Pain in right hip  Acute pain of left knee  Difficulty in walking, not elsewhere classified     Problem List Patient Active Problem List   Diagnosis Date Noted  . Palpitation 09/10/2017  . Atypical chest pain 07/12/2016  . Generalized anxiety disorder 07/04/2016  . Right lower quadrant abdominal pain 02/08/2016  . Eye muscle twitches 02/08/2016  . Fibroid, uterine 02/08/2016  . Piriformis syndrome of right side 01/31/2015  . Allergic reaction caused by a drug 01/27/2015  . Heel spur 01/16/2015  . Essential hypertension, benign 01/16/2015  . Benign essential HTN 05/31/2014  . Plantar fasciitis 05/31/2014  . Other specified hypothyroidism 05/31/2014  . Intramural leiomyoma of uterus 05/31/2014  . Menorrhagia 01/04/2013  . Fibroids 01/04/2013    [name] 04/20/2019, 4:28 PM  [facility] [address] [place],  [place], [address] Phone: [phone]   Fax:  [phone]  Name: Heidi Barrett MRN: [id number] Date of Birth: [birth year]

## 2019-04-22 ENCOUNTER — Ambulatory Visit: Payer: BLUE CROSS/BLUE SHIELD | Admitting: Physical Therapy

## 2019-04-22 ENCOUNTER — Other Ambulatory Visit: Payer: Self-pay

## 2019-04-22 DIAGNOSIS — M25551 Pain in right hip: Secondary | ICD-10-CM

## 2019-04-22 DIAGNOSIS — G8929 Other chronic pain: Secondary | ICD-10-CM

## 2019-04-22 DIAGNOSIS — M5441 Lumbago with sciatica, right side: Secondary | ICD-10-CM | POA: Diagnosis not present

## 2019-04-22 DIAGNOSIS — M25562 Pain in left knee: Secondary | ICD-10-CM

## 2019-04-22 DIAGNOSIS — R262 Difficulty in walking, not elsewhere classified: Secondary | ICD-10-CM

## 2019-04-23 NOTE — Therapy (Signed)
[place], [place], [address] Phone: [phone]   Fax:  [phone]  Physical Therapy Treatment  Patient Details  Name: Heidi Barrett MRN: [id number] Date of Birth: 04-24-1966 Referring Provider (PT): Charlott Rakes, MD   Encounter Date: 04/22/2019  PT End of Session - 04/23/19 0842    Visit Number  2    Number of Visits  12    Date for PT Re-Evaluation  05/18/19    Authorization Type  BCBS    PT Start Time  1615    PT Stop Time  1700    PT Time Calculation (min)  45 min    Activity Tolerance  Patient tolerated treatment well       Past Medical History:  Diagnosis Date  . Anemia   . Hypertension   . Thyroid disease     Past Surgical History:  Procedure Laterality Date  . IR GENERIC HISTORICAL  05/28/2016   IR RADIOLOGIST EVAL & MGMT 05/28/2016 Markus Daft, MD [facility] INTERV RAD  . IR RADIOLOGIST EVAL & MGMT  03/06/2017  . IR RADIOLOGIST EVAL & MGMT  02/17/2018    There were no vitals filed for this visit.  Subjective Assessment - 04/23/19 0840    Subjective  She relays the pain is better and no longer in her thoracic today, but still having pain in lower Rt lumbar.    Pertinent History  PMH: HTN, thyroid disease    How long can you sit comfortably?  41min    How long can you stand comfortably?  15 min    How long can you walk comfortably?  now having to use SPC to make it through the grocery store    Diagnostic tests  no lumbar or hip imaging    Currently in Pain?  Yes    Pain Score  7     Pain Location  Back    Pain Orientation  Right;Lower    Pain Descriptors / Indicators  Aching    Pain Type  Chronic pain    Pain Onset  More than a month ago                       [facility] Adult PT Treatment/Exercise - 04/23/19 0001      Exercises   Other Exercises   prone lying over one pillow X 4 min, then SL lumbar-thoracic rotations 5 sec X 10 each side, standing rows and  extensions red X 20 each, finished with nu step L3 for 5 min UE/LE       Modalities   Modalities  Electrical Stimulation;Moist Heat      Electrical Stimulation   Electrical Stimulation Location  Rt lumbar and thoracic, 15 minutes with 5 additional minutes for education and setup for her home unit    Electrical Stimulation Action  IFC    Electrical Stimulation Parameters  tolerance    Electrical Stimulation Goals  Pain      Iontophoresis   Type of Iontophoresis  Dexamethasone    Location  Rt thoracic    Dose  1.0 CC dex    Time  wear home patch                  PT Long Term Goals - 04/06/19 1759      PT LONG TERM GOAL #1   Title  Pt will be I and compliant with HEP. (Target goal for all goals 6 weeks 05/18/19/20)  Status  New      PT LONG TERM GOAL #2   Title  She will report less than 2-3/10 standing 60 min or more.    Baseline  15 min    Status  New      PT LONG TERM GOAL #3   Title  She will increaese lumbar ROM to [facility].    Status  New      PT LONG TERM GOAL #4   Title  she will increase Rt leg strength to at least 4+/5 overall    Status  New            Plan - 04/23/19 [id number]    Clinical Impression Statement  Pt had overall less pain again today, relays improvement with TENS and IONTO and stretching program so this was continued today with good toelrance. PT will continue to progress as able toward her goals.    Personal Factors and Comorbidities  Comorbidity 1;Comorbidity 2;Comorbidity 3+;Past/Current Experience;Fitness    Comorbidities  chronic LBP, PMH: HTN, thyroid disease, obesity    Examination-Activity Limitations  Bend;Caring for Others;Lift;Stand;Stairs;Squat    Examination-Participation Restrictions  Cleaning;Community Activity;Laundry;Meal Prep    Stability/Clinical Decision Making  Evolving/Moderate complexity    Rehab Potential  Good    PT Frequency  2x / week    PT Duration  6 weeks    PT Treatment/Interventions  Aquatic  Therapy;Cryotherapy;Electrical Stimulation;Iontophoresis 4mg /ml Dexamethasone;Moist Heat;Traction;Ultrasound;Gait training;Stair training;Therapeutic activities;Therapeutic exercise;Balance training;Neuromuscular re-education;Manual techniques;Passive range of motion;Taping;Spinal Manipulations;Joint Manipulations;Dry needling    PT Next Visit Plan  how was HEP?, she may disc pathology but has not had any imaging,  have favored extension with prone lying but hard to tell as has high irritability of symptoms    PT Home Exercise Plan  prone lying, piriformis stretch, slump stretch,LTR       Patient will benefit from skilled therapeutic intervention in order to improve the following deficits and impairments:  Abnormal gait, Decreased activity tolerance, Decreased strength, Decreased range of motion, Difficulty walking, Increased muscle spasms, Obesity, Pain  Visit Diagnosis: Chronic right-sided low back pain with right-sided sciatica  Pain in right hip  Acute pain of left knee  Difficulty in walking, not elsewhere classified     Problem List Patient Active Problem List   Diagnosis Date Noted  . Palpitation 09/10/2017  . Atypical chest pain 07/12/2016  . Generalized anxiety disorder 07/04/2016  . Right lower quadrant abdominal pain 02/08/2016  . Eye muscle twitches 02/08/2016  . Fibroid, uterine 02/08/2016  . Piriformis syndrome of right side 01/31/2015  . Allergic reaction caused by a drug 01/27/2015  . Heel spur 01/16/2015  . Essential hypertension, benign 01/16/2015  . Benign essential HTN 05/31/2014  . Plantar fasciitis 05/31/2014  . Other specified hypothyroidism 05/31/2014  . Intramural leiomyoma of uterus 05/31/2014  . Menorrhagia 01/04/2013  . Fibroids 01/04/2013    [name] 04/23/2019, 8:45 AM  [facility] [address] [place], [place], [address] Phone: [phone]   Fax:  [phone]  Name: Emberlyn Ouk MRN: [id number] Date of Birth: [birth year]

## 2019-04-27 ENCOUNTER — Ambulatory Visit: Payer: BLUE CROSS/BLUE SHIELD | Admitting: Physical Therapy

## 2019-04-27 ENCOUNTER — Ambulatory Visit: Payer: BLUE CROSS/BLUE SHIELD | Attending: Family Medicine | Admitting: Family Medicine

## 2019-04-27 ENCOUNTER — Other Ambulatory Visit: Payer: Self-pay

## 2019-04-27 ENCOUNTER — Encounter: Payer: Self-pay | Admitting: Family Medicine

## 2019-04-27 DIAGNOSIS — M5441 Lumbago with sciatica, right side: Secondary | ICD-10-CM

## 2019-04-27 DIAGNOSIS — E038 Other specified hypothyroidism: Secondary | ICD-10-CM | POA: Diagnosis not present

## 2019-04-27 DIAGNOSIS — I10 Essential (primary) hypertension: Secondary | ICD-10-CM

## 2019-04-27 DIAGNOSIS — G8929 Other chronic pain: Secondary | ICD-10-CM

## 2019-04-27 DIAGNOSIS — M5442 Lumbago with sciatica, left side: Secondary | ICD-10-CM | POA: Diagnosis not present

## 2019-04-27 DIAGNOSIS — M25551 Pain in right hip: Secondary | ICD-10-CM

## 2019-04-27 DIAGNOSIS — M25562 Pain in left knee: Secondary | ICD-10-CM

## 2019-04-27 DIAGNOSIS — R262 Difficulty in walking, not elsewhere classified: Secondary | ICD-10-CM

## 2019-04-27 MED ORDER — METOPROLOL TARTRATE 25 MG PO TABS: 25.0000 mg | ORAL_TABLET | Freq: Two times a day (BID) | ORAL | 6 refills | Status: DC

## 2019-04-27 MED ORDER — CYCLOBENZAPRINE HCL 10 MG PO TABS: 10.0000 mg | ORAL_TABLET | Freq: Two times a day (BID) | ORAL | 1 refills | Status: DC | PRN

## 2019-04-27 MED ORDER — AMLODIPINE BESYLATE 10 MG PO TABS: ORAL_TABLET | ORAL | 6 refills | Status: DC

## 2019-04-27 NOTE — Therapy (Signed)
[place], [place], [address] Phone: [phone]   Fax:  [phone]  Physical Therapy Treatment  Patient Details  Name: Heidi Barrett MRN: [id number] Date of Birth: [birth year] Referring Provider (PT): Charlott Rakes, MD   Encounter Date: 04/27/2019  PT End of Session - 04/27/19 [id number]    Visit Number  4   last visit was not counted by mistake   Number of Visits  12    Date for PT Re-Evaluation  05/18/19    Authorization Type  BCBS    PT Start Time  1530    PT Stop Time  1615    PT Time Calculation (min)  45 min    Activity Tolerance  Patient tolerated treatment well       Past Medical History:  Diagnosis Date  . Anemia   . Hypertension   . Thyroid disease     Past Surgical History:  Procedure Laterality Date  . IR GENERIC HISTORICAL  05/28/2016   IR RADIOLOGIST EVAL & MGMT 05/28/2016 Markus Daft, MD [facility] INTERV RAD  . IR RADIOLOGIST EVAL & MGMT  03/06/2017  . IR RADIOLOGIST EVAL & MGMT  02/17/2018    There were no vitals filed for this visit.  Subjective Assessment - 04/27/19 1600    Subjective  Overall pain is better but then she had to move the washer at home and it may have caused her to have 9/10 back pain last night but the TENS and ice helped it some.    Pertinent History  PMH: HTN, thyroid disease    How long can you sit comfortably?  66min    How long can you stand comfortably?  15 min    How long can you walk comfortably?  now having to use SPC to make it through the grocery store    Diagnostic tests  no lumbar or hip imaging    Pain Onset  More than a month ago                       [facility] Adult PT Treatment/Exercise - 04/27/19 0001      Exercises   Exercises  Lumbar      Lumbar Exercises: Machines for Strengthening   Cybex Knee Extension  10 lbs X 15    Cybex Knee Flexion  25 lbs X 15    Leg Press  45 lbs X 15    Other Lumbar Machine Exercise  lat pull 15  lbs X 15      Lumbar Exercises: Standing   Row  20 reps    Theraband Level (Row)  Level 3 (Green)    Shoulder Extension  20 reps    Theraband Level (Shoulder Extension)  Level 3 (Green)      Lumbar Exercises: Prone   Other Prone Lumbar Exercises  prone lying 3 min      Electrical Stimulation   Electrical Stimulation Location  lumbar pt in prone over one pillow    Electrical Stimulation Action  IFC    Electrical Stimulation Parameters  tolerance    Electrical Stimulation Goals  Pain                  PT Long Term Goals - 04/06/19 1759      PT LONG TERM GOAL #1   Title  Pt will be I and compliant with HEP. (Target goal for all goals 6 weeks 05/18/19/20)    Status  New      PT LONG TERM GOAL #2   Title  She will report less than 2-3/10 standing 60 min or more.    Baseline  15 min    Status  New      PT LONG TERM GOAL #3   Title  She will increaese lumbar ROM to [facility].    Status  New      PT LONG TERM GOAL #4   Title  she will increase Rt leg strength to at least 4+/5 overall    Status  New            Plan - 04/27/19 1605    Clinical Impression Statement  Progressed her overall strengthening and activity tolerance today, added in weight machines for general strength and function with good tolerance. Ended with cold and TENS to reduce pain and soreness.    Personal Factors and Comorbidities  Comorbidity 1;Comorbidity 2;Comorbidity 3+;Past/Current Experience;Fitness    Comorbidities  chronic LBP, PMH: HTN, thyroid disease, obesity    Examination-Activity Limitations  Bend;Caring for Others;Lift;Stand;Stairs;Squat    Examination-Participation Restrictions  Cleaning;Community Activity;Laundry;Meal Prep    Stability/Clinical Decision Making  Evolving/Moderate complexity    Rehab Potential  Good    PT Frequency  2x / week    PT Duration  6 weeks    PT Treatment/Interventions  Aquatic Therapy;Cryotherapy;Electrical Stimulation;Iontophoresis 4mg /ml  Dexamethasone;Moist Heat;Traction;Ultrasound;Gait training;Stair training;Therapeutic activities;Therapeutic exercise;Balance training;Neuromuscular re-education;Manual techniques;Passive range of motion;Taping;Spinal Manipulations;Joint Manipulations;Dry needling    PT Next Visit Plan  how was HEP?, she may disc pathology but has not had any imaging,  have favored extension with prone lying but hard to tell    PT Home Exercise Plan  prone lying, piriformis stretch, slump stretch,LTR       Patient will benefit from skilled therapeutic intervention in order to improve the following deficits and impairments:  Abnormal gait, Decreased activity tolerance, Decreased strength, Decreased range of motion, Difficulty walking, Increased muscle spasms, Obesity, Pain  Visit Diagnosis: Chronic right-sided low back pain with right-sided sciatica  Pain in right hip  Acute pain of left knee  Difficulty in walking, not elsewhere classified     Problem List Patient Active Problem List   Diagnosis Date Noted  . Palpitation 09/10/2017  . Atypical chest pain 07/12/2016  . Generalized anxiety disorder 07/04/2016  . Right lower quadrant abdominal pain 02/08/2016  . Eye muscle twitches 02/08/2016  . Fibroid, uterine 02/08/2016  . Piriformis syndrome of right side 01/31/2015  . Allergic reaction caused by a drug 01/27/2015  . Heel spur 01/16/2015  . Essential hypertension, benign 01/16/2015  . Benign essential HTN 05/31/2014  . Plantar fasciitis 05/31/2014  . Other specified hypothyroidism 05/31/2014  . Intramural leiomyoma of uterus 05/31/2014  . Menorrhagia 01/04/2013  . Fibroids 01/04/2013    [name] 04/27/2019, 5:45 PM  [facility] [address] [place], [place], [address] Phone: [phone]   Fax:  [phone]  Name: Heidi Barrett MRN: [id number] Date of Birth: Mar 27, 1966

## 2019-04-27 NOTE — Progress Notes (Signed)
Virtual Visit via Telephone Note  I connected with Heidi Barrett, on 04/27/2019 at 11:07 AM by telephone due to the COVID-19 pandemic and verified that I am speaking with the correct person using two identifiers.   Consent: I discussed the limitations, risks, security and privacy concerns of performing an evaluation and management service by telephone and the availability of in person appointments. I also discussed with the patient that there may be a patient responsible charge related to this service. The patient expressed understanding and agreed to proceed.   Location of Patient: Home  Location of Provider: Clinic   Persons participating in Telemedicine visit: Itzy Adler Farrington-CMA Dr. Felecia Shelling     History of Present Illness: Heidi Barrett is a 53 year old female with a history of hypertension, menorrhagia secondary to fibroids, hypothyroidism who presents today for follow-up visit.  She continues to have low back pain and is undergoing physical therapy. PT therapist  recommended xrays as pain has not improved, she is also using a TENS unit. She is walking with a cane which she wasn't doing previously. Pain is across lower back , radiates to hip and both legs, moderate to severe ocassional left leg numbness. She took 8 weeks off her work as she is unable to perform the lifting required (60lbs or more). She dates pain back to when her job function was changed at work early in the year. Denies falls, loss of sphincteric function. Uses Ibuprofen with some relief; muscle relaxants have been beneficial however she had an allergic reaction to Robaxin last month.  Compliant with her antihypertensive and levothyroxine which she takes for hypothyroidism.  Past Medical History:  Diagnosis Date  . Anemia   . Hypertension   . Thyroid disease    Allergies  Allergen Reactions  . Hctz [Hydrochlorothiazide] Shortness Of Breath and Other (See Comments)    wheezing   . Robaxin [Methocarbamol] Swelling    Sensation of throat swelling/difficulty swallowing after taking this medication  . Tramadol Anaphylaxis and Swelling  . Hydrocodone Nausea And Vomiting    dizzines  . Percocet [Oxycodone-Acetaminophen] Nausea And Vomiting and Other (See Comments)    weakness    Current Outpatient Medications on File Prior to Visit  Medication Sig Dispense Refill  . albuterol (PROVENTIL HFA) 108 (90 Base) MCG/ACT inhaler Inhale 2 puffs into the lungs every 4 (four) hours as needed for wheezing or shortness of breath. 6.7 g 2  . amLODipine (NORVASC) 10 MG tablet TAKE 1 TABLET BY MOUTH DAILY. FOR BLOOD PRESSURE. 30 tablet 6  . calcium carbonate (CALCIUM 600) 600 MG TABS tablet Take 1 tablet (600 mg total) by mouth daily with breakfast. 60 tablet 3  . EPINEPHrine 0.3 mg/0.3 mL IJ SOAJ injection Inject 0.3 mLs (0.3 mg total) into the muscle as needed for anaphylaxis. 1 each prn  . fluticasone (FLONASE) 50 MCG/ACT nasal spray Place 2 sprays into both nostrils daily. 16 g 12  . ibuprofen (ADVIL,MOTRIN) 600 MG tablet Take 1 tablet (600 mg total) by mouth every 8 (eight) hours as needed. 60 tablet 1  . levothyroxine (SYNTHROID) 150 MCG tablet TAKE 1 TABLET (150 MCG TOTAL) BY MOUTH DAILY BEFORE BREAKFAST. 30 tablet 2  . metoprolol tartrate (LOPRESSOR) 25 MG tablet Take 1 tablet (25 mg total) by mouth 2 (two) times daily. 60 tablet 6  . Multiple Vitamin (MULTIVITAMIN WITH MINERALS) TABS tablet Take 1 tablet by mouth daily. 90 tablet 3  . acetaminophen (TYLENOL) 325 MG tablet Take 650 mg by mouth every  6 (six) hours as needed for mild pain.    . cefdinir (OMNICEF) 300 MG capsule Take 2 capsules (600 mg total) by mouth daily. (Patient not taking: Reported on 10/07/2018) 20 capsule 0  . cholecalciferol (VITAMIN D) 1000 UNITS tablet Take 1 tablet (1,000 Units total) by mouth daily. (Patient not taking: Reported on 10/07/2018) 90 tablet 3  . predniSONE (DELTASONE) 50 MG tablet One daily  (Patient not taking: Reported on 10/07/2018) 4 tablet 0   No current facility-administered medications on file prior to visit.     Observations/Objective: Weak, alert, oriented x3 Not in acute distress  CMP Latest Ref Rng & Units 05/18/2018 12/11/2017 12/02/2017  Glucose 65 - 99 mg/dL 92 76 86  BUN 6 - 24 mg/dL _0 Creatinine 0.57 - 1.00 mg/dL 0.91 1.02(H) 0.95  Sodium 134 - 144 mmol/L 143 143 141  Potassium 3.5 - 5.2 mmol/L 3.8 3.6 3.1(L)  Chloride 96 - 106 mmol/L 104 105 105  CO2 20 - 29 mmol/L _1 Calcium 8.7 - 10.2 mg/dL 9.2 9.2 9.6  Total Protein 6.0 - 8.5 g/dL 6.7 6.6 -  Total Bilirubin 0.0 - 1.2 mg/dL 0.2 0.3 -  Alkaline Phos 39 - 117 IU/L 63 48 -  AST 0 - 40 IU/L 14 12 -  ALT 0 - 32 IU/L 14 16 -    Lipid Panel     Component Value Date/Time   CHOL 160 12/11/2017 1019   TRIG 58 12/11/2017 1019   HDL 46 12/11/2017 1019   CHOLHDL 3.5 12/11/2017 1019   CHOLHDL 2.6 01/16/2015 1041   VLDL 9 01/16/2015 1041   LDLCALC 102 (H) 12/11/2017 1019    Lab Results  Component Value Date   TSH 3.560 10/07/2018    Assessment and Plan: 1. Essential hypertension, benign Controlled Counseled on blood pressure goal of less than 130/80, low-sodium, DASH diet, medication compliance, 150 minutes of moderate intensity exercise per week. Discussed medication compliance, adverse effects. - amLODipine (NORVASC) 10 MG tablet; TAKE 1 TABLET BY MOUTH DAILY. FOR BLOOD PRESSURE.  Dispense: 30 tablet; Refill: 6 - CMP14+EGFR; Future - Lipid panel; Future - metoprolol tartrate (LOPRESSOR) 25 MG tablet; Take 1 tablet (25 mg total) by mouth 2 (two) times daily.  Dispense: 60 tablet; Refill: 6  2. Other specified hypothyroidism Controlled - T4, free; Future - TSH; Future  3. Chronic midline low back pain with bilateral sciatica Uncontrolled We will proceed with back imaging and subsequently MRI if indicated She is requesting a note for work to extend her leave beyond 05/25/2019.   Advised to call the office close to when her leave will be running out. - DG Lumbar Spine Complete; Future - cyclobenzaprine (FLEXERIL) 10 MG tablet; Take 1 tablet (10 mg total) by mouth 2 (two) times daily as needed for muscle spasms.  Dispense: 60 tablet; Refill: 1   Follow Up Instructions: Return in about 1 month (around 05/27/2019) for complete physical; fasting labs tomorrow.    I discussed the assessment and treatment plan with the patient. The patient was provided an opportunity to ask questions and all were answered. The patient agreed with the plan and demonstrated an understanding of the instructions.   The patient was advised to call back or seek an in-person evaluation if the symptoms worsen or if the condition fails to improve as anticipated.     I provided 17 minutes total of non-face-to-face time during this encounter including median intraservice time, reviewing previous notes, labs,  imaging, medications, management and patient verbalized understanding.     Charlott Rakes, MD, FAAFP. [facility] [place], [place]   04/27/2019, 11:07 AM

## 2019-04-27 NOTE — Progress Notes (Signed)
Patient has been called and DOB has been verified. Patient has been screened and transferred to PCP to start phone visit.  Patient needs refill on all medications.

## 2019-04-28 ENCOUNTER — Other Ambulatory Visit: Payer: BLUE CROSS/BLUE SHIELD

## 2019-04-29 ENCOUNTER — Ambulatory Visit: Payer: BLUE CROSS/BLUE SHIELD

## 2019-04-29 ENCOUNTER — Other Ambulatory Visit: Payer: Self-pay

## 2019-04-29 ENCOUNTER — Ambulatory Visit (HOSPITAL_COMMUNITY)
Admission: RE | Admit: 2019-04-29 | Discharge: 2019-04-29 | Disposition: A | Discharge status: Home / Self Care | Payer: BLUE CROSS/BLUE SHIELD | Source: Ambulatory Visit | Attending: Family Medicine | Admitting: Family Medicine

## 2019-04-29 ENCOUNTER — Ambulatory Visit: Payer: BLUE CROSS/BLUE SHIELD | Admitting: Physical Therapy

## 2019-04-29 ENCOUNTER — Encounter: Payer: Self-pay | Admitting: Physical Therapy

## 2019-04-29 DIAGNOSIS — I10 Essential (primary) hypertension: Secondary | ICD-10-CM

## 2019-04-29 DIAGNOSIS — M25562 Pain in left knee: Secondary | ICD-10-CM

## 2019-04-29 DIAGNOSIS — M5441 Lumbago with sciatica, right side: Secondary | ICD-10-CM | POA: Diagnosis not present

## 2019-04-29 DIAGNOSIS — M5442 Lumbago with sciatica, left side: Secondary | ICD-10-CM | POA: Insufficient documentation

## 2019-04-29 DIAGNOSIS — E038 Other specified hypothyroidism: Secondary | ICD-10-CM

## 2019-04-29 DIAGNOSIS — G8929 Other chronic pain: Secondary | ICD-10-CM

## 2019-04-29 DIAGNOSIS — R262 Difficulty in walking, not elsewhere classified: Secondary | ICD-10-CM

## 2019-04-29 DIAGNOSIS — M25551 Pain in right hip: Secondary | ICD-10-CM

## 2019-04-30 ENCOUNTER — Other Ambulatory Visit: Payer: Self-pay | Admitting: Family Medicine

## 2019-04-30 DIAGNOSIS — M5442 Lumbago with sciatica, left side: Secondary | ICD-10-CM

## 2019-04-30 DIAGNOSIS — G8929 Other chronic pain: Secondary | ICD-10-CM

## 2019-04-30 LAB — CMP14+EGFR
ALT: 16 IU/L (ref 0–32)
AST: 14 IU/L (ref 0–40)
Albumin/Globulin Ratio: 1.6 (ref 1.2–2.2)
Albumin: 4 g/dL (ref 3.8–4.9)
Alkaline Phosphatase: 53 IU/L (ref 39–117)
BUN/Creatinine Ratio: 12 (ref 9–23)
BUN: 12 mg/dL (ref 6–24)
Bilirubin Total: 0.5 mg/dL (ref 0.0–1.2)
CO2: 26 mmol/L (ref 20–29)
Calcium: 9.2 mg/dL (ref 8.7–10.2)
Chloride: 104 mmol/L (ref 96–106)
Creatinine, Ser: 1.01 mg/dL — ABNORMAL HIGH (ref 0.57–1.00)
GFR calc Af Amer: 74 mL/min/{1.73_m2} (ref >59)
GFR calc non Af Amer: 65 mL/min/{1.73_m2} (ref >59)
Globulin, Total: 2.5 g/dL (ref 1.5–4.5)
Glucose: 76 mg/dL (ref 65–99)
Potassium: 3.7 mmol/L (ref 3.5–5.2)
Sodium: 141 mmol/L (ref 134–144)
Total Protein: 6.5 g/dL (ref 6.0–8.5)

## 2019-04-30 LAB — LIPID PANEL
Chol/HDL Ratio: 3.3 ratio (ref 0.0–4.4)
Cholesterol, Total: 193 mg/dL (ref 100–199)
HDL: 58 mg/dL (ref >39)
LDL Chol Calc (NIH): 120 mg/dL — ABNORMAL HIGH (ref 0–99)
Triglycerides: 82 mg/dL (ref 0–149)
VLDL Cholesterol Cal: 15 mg/dL (ref 5–40)

## 2019-04-30 LAB — TSH: TSH: 2.13 u[IU]/mL (ref 0.450–4.500)

## 2019-04-30 LAB — T4, FREE: Free T4: 1.87 ng/dL — ABNORMAL HIGH (ref 0.82–1.77)

## 2019-04-30 NOTE — Therapy (Signed)
[place], [place], [address] Phone: [phone]   Fax:  [phone]  Physical Therapy Treatment  Patient Details  Name: Heidi Barrett MRN: [id number] Date of Birth: Jan 28, 1966 Referring Provider (PT): Charlott Rakes, MD   Encounter Date: 04/29/2019  PT End of Session - 04/30/19 0355    Visit Number  5   last visit was not counted by mistake   Number of Visits  12    Date for PT Re-Evaluation  05/18/19    Authorization Type  BCBS    PT Start Time  1615    PT Stop Time  1700    PT Time Calculation (min)  45 min    Activity Tolerance  Patient tolerated treatment well       Past Medical History:  Diagnosis Date  . Anemia   . Hypertension   . Thyroid disease     Past Surgical History:  Procedure Laterality Date  . IR GENERIC HISTORICAL  05/28/2016   IR RADIOLOGIST EVAL & MGMT 05/28/2016 Markus Daft, MD [facility] INTERV RAD  . IR RADIOLOGIST EVAL & MGMT  03/06/2017  . IR RADIOLOGIST EVAL & MGMT  02/17/2018    There were no vitals filed for this visit.  Subjective Assessment - 04/29/19 1654    Subjective  The pain is getting better only 6/10 today in her back    Pertinent History  PMH: HTN, thyroid disease    How long can you sit comfortably?  57min    How long can you stand comfortably?  15 min    How long can you walk comfortably?  now having to use SPC to make it through the grocery store    Diagnostic tests  no lumbar or hip imaging    Pain Onset  More than a month ago                       [facility] Adult PT Treatment/Exercise - 04/29/19 0001      Lumbar Exercises: Aerobic   Nustep  5 min L5 UE/LE      Lumbar Exercises: Machines for Strengthening   Cybex Knee Extension  10 lbs X 15    Cybex Knee Flexion  25 lbs X 15    Leg Press  45 lbs X 15    Other Lumbar Machine Exercise  lat pull and row machine both 15 lbs X 15      Lumbar Exercises: Prone   Other Prone Lumbar  Exercises  prone lying 3 min     Electrical Stimulation      Pt prone IFC tens to lumbar spine 15         minutes for pain             PT Long Term Goals - 04/06/19 1759      PT LONG TERM GOAL #1   Title  Pt will be I and compliant with HEP. (Target goal for all goals 6 weeks 05/18/19/20)    Status  New      PT LONG TERM GOAL #2   Title  She will report less than 2-3/10 standing 60 min or more.    Baseline  15 min    Status  New      PT LONG TERM GOAL #3   Title  She will increaese lumbar ROM to [facility].    Status  New      PT LONG TERM GOAL #4  Title  she will increase Rt leg strength to at least 4+/5 overall    Status  New            Plan - 04/30/19 0356    Clinical Impression Statement  She was again able to progress strength and activity tolerance. Overall is progressing with function but still having pain, continue POC    Personal Factors and Comorbidities  Comorbidity 1;Comorbidity 2;Comorbidity 3+;Past/Current Experience;Fitness    Examination-Activity Limitations  Bend;Caring for Others;Lift;Stand;Stairs;Squat    Examination-Participation Restrictions  Cleaning;Community Activity;Laundry;Meal Prep    Stability/Clinical Decision Making  Evolving/Moderate complexity    Rehab Potential  Good    PT Frequency  2x / week    PT Duration  6 weeks    PT Treatment/Interventions  Aquatic Therapy;Cryotherapy;Electrical Stimulation;Iontophoresis 4mg /ml Dexamethasone;Moist Heat;Traction;Ultrasound;Gait training;Stair training;Therapeutic activities;Therapeutic exercise;Balance training;Neuromuscular re-education;Manual techniques;Passive range of motion;Taping;Spinal Manipulations;Joint Manipulations;Dry needling    PT Next Visit Plan  how was HEP?, she may disc pathology but has not had any imaging,  have favored extension with prone lying but hard to tell    PT Home Exercise Plan  prone lying, piriformis stretch, slump stretch,LTR       Patient will benefit from  skilled therapeutic intervention in order to improve the following deficits and impairments:  Abnormal gait, Decreased activity tolerance, Decreased strength, Decreased range of motion, Difficulty walking, Increased muscle spasms, Obesity, Pain  Visit Diagnosis: Chronic right-sided low back pain with right-sided sciatica  Pain in right hip  Acute pain of left knee  Difficulty in walking, not elsewhere classified     Problem List Patient Active Problem List   Diagnosis Date Noted  . Palpitation 09/10/2017  . Atypical chest pain 07/12/2016  . Generalized anxiety disorder 07/04/2016  . Right lower quadrant abdominal pain 02/08/2016  . Eye muscle twitches 02/08/2016  . Fibroid, uterine 02/08/2016  . Piriformis syndrome of right side 01/31/2015  . Allergic reaction caused by a drug 01/27/2015  . Heel spur 01/16/2015  . Essential hypertension, benign 01/16/2015  . Benign essential HTN 05/31/2014  . Plantar fasciitis 05/31/2014  . Other specified hypothyroidism 05/31/2014  . Intramural leiomyoma of uterus 05/31/2014  . Menorrhagia 01/04/2013  . Fibroids 01/04/2013    [name] 04/30/2019, 3:58 AM  [facility] [address] [place], [place], [address] Phone: [phone]   Fax:  [phone]  Name: Roniqua Marruffo MRN: [id number] Date of Birth: 04-10-1966

## 2019-05-04 ENCOUNTER — Ambulatory Visit: Payer: BLUE CROSS/BLUE SHIELD | Admitting: Physical Therapy

## 2019-05-04 ENCOUNTER — Other Ambulatory Visit: Payer: Self-pay

## 2019-05-04 DIAGNOSIS — M25562 Pain in left knee: Secondary | ICD-10-CM

## 2019-05-04 DIAGNOSIS — G8929 Other chronic pain: Secondary | ICD-10-CM

## 2019-05-04 DIAGNOSIS — M25551 Pain in right hip: Secondary | ICD-10-CM

## 2019-05-04 DIAGNOSIS — R262 Difficulty in walking, not elsewhere classified: Secondary | ICD-10-CM

## 2019-05-04 DIAGNOSIS — M5441 Lumbago with sciatica, right side: Secondary | ICD-10-CM | POA: Diagnosis not present

## 2019-05-05 NOTE — Therapy (Signed)
[place], [place], [address] Phone: [phone]   Fax:  [phone]  Physical Therapy Treatment  Patient Details  Name: Heidi Barrett MRN: [id number] Date of Birth: 1965/12/15 Referring Provider (PT): Charlott Rakes, MD   Encounter Date: 05/04/2019  PT End of Session - 05/05/19 0817    Visit Number  5    Number of Visits  12    Date for PT Re-Evaluation  05/18/19    Authorization Type  BCBS    PT Start Time  1530    PT Stop Time  1615    PT Time Calculation (min)  45 min    Activity Tolerance  Patient tolerated treatment well       Past Medical History:  Diagnosis Date  . Anemia   . Hypertension   . Thyroid disease     Past Surgical History:  Procedure Laterality Date  . IR GENERIC HISTORICAL  05/28/2016   IR RADIOLOGIST EVAL & MGMT 05/28/2016 Markus Daft, MD [facility] INTERV RAD  . IR RADIOLOGIST EVAL & MGMT  03/06/2017  . IR RADIOLOGIST EVAL & MGMT  02/17/2018    There were no vitals filed for this visit.  Subjective Assessment - 05/04/19 1545    Subjective  The pain is getting a little better, had new XR taken and says MD wants to order MRI    Pertinent History  PMH: HTN, thyroid disease    How long can you sit comfortably?  70min    How long can you stand comfortably?  15 min    How long can you walk comfortably?  now having to use SPC to make it through the grocery store    Diagnostic tests  IMPRESSION: Mild anterolisthesis at L4-L5 likely secondary to facet arthropathy. No evidence for a pars defect    Pain Onset  More than a month ago                       [facility] Adult PT Treatment/Exercise - 05/05/19 0001      Lumbar Exercises: Aerobic   Nustep  6 min L5 UE/LE with heat      Lumbar Exercises: Machines for Strengthening   Cybex Knee Extension  10 lbs X 20    Cybex Knee Flexion  25 lbs X 20    Leg Press  45 lbs X 20    Other Lumbar Machine Exercise  lat pull and  row machine both 15 lbs X 20      Modalities   Modalities  Electrical Stimulation;Cryotherapy      Cryotherapy   Number Minutes Cryotherapy  15 Minutes    Cryotherapy Location  Lumbar Spine    Type of Cryotherapy  Ice pack      Electrical Stimulation   Electrical Stimulation Location  lumbar pt in prone over one pillow    Electrical Stimulation Action  IFC    Electrical Stimulation Parameters  tolerance    Electrical Stimulation Goals  Pain             PT Education - 05/04/19 1625    Education Details  educated on XR findings and to avoid repetitive lumbar extension movements    Person(s) Educated  Patient    Methods  Explanation;Demonstration    Comprehension  Verbalized understanding          PT Long Term Goals - 04/06/19 1759      PT LONG TERM GOAL #1  Title  Pt will be I and compliant with HEP. (Target goal for all goals 6 weeks 05/18/19/20)    Status  New      PT LONG TERM GOAL #2   Title  She will report less than 2-3/10 standing 60 min or more.    Baseline  15 min    Status  New      PT LONG TERM GOAL #3   Title  She will increaese lumbar ROM to [facility].    Status  New      PT LONG TERM GOAL #4   Title  she will increase Rt leg strength to at least 4+/5 overall    Status  New            Plan - 05/05/19 0817    Clinical Impression Statement  Discussed XR results with her as she was asking what they meant. She has Mild anterolisthesis at L4-L5 likely secondary to facet arthropathy. No evidence for a pars defect. She was cautioned to avoid repetitive lumbar extension but other than that she is fine to exercised and stretch to tolerance which she is progressing with PT.    Personal Factors and Comorbidities  Comorbidity 1;Comorbidity 2;Comorbidity 3+;Past/Current Experience;Fitness    Examination-Activity Limitations  Bend;Caring for Others;Lift;Stand;Stairs;Squat    Examination-Participation Restrictions  Cleaning;Community Activity;Laundry;Meal Prep     Stability/Clinical Decision Making  Evolving/Moderate complexity    Rehab Potential  Good    PT Frequency  2x / week    PT Duration  6 weeks    PT Treatment/Interventions  Aquatic Therapy;Cryotherapy;Electrical Stimulation;Iontophoresis 4mg /ml Dexamethasone;Moist Heat;Traction;Ultrasound;Gait training;Stair training;Therapeutic activities;Therapeutic exercise;Balance training;Neuromuscular re-education;Manual techniques;Passive range of motion;Taping;Spinal Manipulations;Joint Manipulations;Dry needling    PT Next Visit Plan  how was HEP?, she may disc pathology but has not had any imaging,  have favored extension with prone lying but hard to tell    PT Home Exercise Plan  prone lying, piriformis stretch, slump stretch,LTR       Patient will benefit from skilled therapeutic intervention in order to improve the following deficits and impairments:  Abnormal gait, Decreased activity tolerance, Decreased strength, Decreased range of motion, Difficulty walking, Increased muscle spasms, Obesity, Pain  Visit Diagnosis: Chronic right-sided low back pain with right-sided sciatica  Pain in right hip  Difficulty in walking, not elsewhere classified  Acute pain of left knee     Problem List Patient Active Problem List   Diagnosis Date Noted  . Palpitation 09/10/2017  . Atypical chest pain 07/12/2016  . Generalized anxiety disorder 07/04/2016  . Right lower quadrant abdominal pain 02/08/2016  . Eye muscle twitches 02/08/2016  . Fibroid, uterine 02/08/2016  . Piriformis syndrome of right side 01/31/2015  . Allergic reaction caused by a drug 01/27/2015  . Heel spur 01/16/2015  . Essential hypertension, benign 01/16/2015  . Benign essential HTN 05/31/2014  . Plantar fasciitis 05/31/2014  . Other specified hypothyroidism 05/31/2014  . Intramural leiomyoma of uterus 05/31/2014  . Menorrhagia 01/04/2013  . Fibroids 01/04/2013    [name] 05/05/2019, 8:19 AM  [facility] [address] [place], [place], [address] Phone: [phone]   Fax:  [phone]  Name: Heidi Barrett MRN: [id number] Date of Birth: [birth year]

## 2019-05-06 ENCOUNTER — Ambulatory Visit: Payer: BLUE CROSS/BLUE SHIELD | Attending: Family Medicine | Admitting: Physical Therapy

## 2019-05-06 ENCOUNTER — Other Ambulatory Visit: Payer: Self-pay

## 2019-05-06 DIAGNOSIS — M25562 Pain in left knee: Secondary | ICD-10-CM | POA: Diagnosis present

## 2019-05-06 DIAGNOSIS — M5441 Lumbago with sciatica, right side: Secondary | ICD-10-CM | POA: Insufficient documentation

## 2019-05-06 DIAGNOSIS — M25551 Pain in right hip: Secondary | ICD-10-CM | POA: Diagnosis present

## 2019-05-06 DIAGNOSIS — R262 Difficulty in walking, not elsewhere classified: Secondary | ICD-10-CM | POA: Diagnosis present

## 2019-05-06 DIAGNOSIS — G8929 Other chronic pain: Secondary | ICD-10-CM | POA: Insufficient documentation

## 2019-05-06 NOTE — Therapy (Signed)
[place], [place], [address] Phone: [phone]   Fax:  [phone]  Physical Therapy Treatment  Patient Details  Name: Heidi Barrett MRN: [id number] Date of Birth: 04/03/1966 Referring Provider (PT): Charlott Rakes, MD   Encounter Date: 05/06/2019  PT End of Session - 05/06/19 1657    Visit Number  6    Number of Visits  12    Date for PT Re-Evaluation  05/18/19    Authorization Type  BCBS    PT Start Time  1615    PT Stop Time  1700    PT Time Calculation (min)  45 min    Activity Tolerance  Patient tolerated treatment well       Past Medical History:  Diagnosis Date  . Anemia   . Hypertension   . Thyroid disease     Past Surgical History:  Procedure Laterality Date  . IR GENERIC HISTORICAL  05/28/2016   IR RADIOLOGIST EVAL & MGMT 05/28/2016 Markus Daft, MD [facility] INTERV RAD  . IR RADIOLOGIST EVAL & MGMT  03/06/2017  . IR RADIOLOGIST EVAL & MGMT  02/17/2018    There were no vitals filed for this visit.  Subjective Assessment - 05/06/19 1654    Subjective  The pain is getting a little better, 5/10 overall that comes and goes    Pertinent History  PMH: HTN, thyroid disease    How long can you sit comfortably?  18min    How long can you stand comfortably?  15 min    How long can you walk comfortably?  now having to use SPC to make it through the grocery store    Diagnostic tests  IMPRESSION: Mild anterolisthesis at L4-L5 likely secondary to facet arthropathy. No evidence for a pars defect    Pain Onset  More than a month ago                       [facility] Adult PT Treatment/Exercise - 05/06/19 0001      Lumbar Exercises: Stretches   Other Lumbar Stretch Exercise  seated lumbar flexion stretch P ball roll outs 10 sec X 10      Lumbar Exercises: Aerobic   Nustep  6 min L5 UE/LE with heat      Lumbar Exercises: Machines for Strengthening   Other Lumbar Machine Exercise  lat  pull and row machine both 20 lbs X 20 (using both grips for row machine)      Lumbar Exercises: Standing   Functional Squats Limitations  10 reps leaning back against pball on wall      Cryotherapy   Number Minutes Cryotherapy  10 Minutes    Cryotherapy Location  Lumbar Spine    Type of Cryotherapy  Ice pack      Electrical Stimulation   Electrical Stimulation Location  lumbar pt in prone over one pillow    Electrical Stimulation Action  IFC    Electrical Stimulation Parameters  tolerance    Electrical Stimulation Goals  Pain                  PT Long Term Goals - 05/06/19 1708      PT LONG TERM GOAL #1   Title  Pt will be I and compliant with HEP. (Target goal for all goals 6 weeks 05/18/19/20)    Status  On-going      PT LONG TERM GOAL #2   Title  She  will report less than 2-3/10 standing 60 min or more.    Baseline  now about 5/10    Status  On-going      PT LONG TERM GOAL #3   Title  She will increaese lumbar ROM to [facility].    Status  On-going      PT LONG TERM GOAL #4   Title  she will increase Rt leg strength to at least 4+/5 overall    Status  On-going            Plan - 05/06/19 1657    Clinical Impression Statement  She was able to progress her strengthening program with good tolerance and added in wall squats today. Continued TENS and CP post session to decrease pain and sorenss. COntinue PT.    Personal Factors and Comorbidities  Comorbidity 1;Comorbidity 2;Comorbidity 3+;Past/Current Experience;Fitness    Examination-Activity Limitations  Bend;Caring for Others;Lift;Stand;Stairs;Squat    Examination-Participation Restrictions  Cleaning;Community Activity;Laundry;Meal Prep    Stability/Clinical Decision Making  Evolving/Moderate complexity    Rehab Potential  Good    PT Frequency  2x / week    PT Duration  6 weeks    PT Treatment/Interventions  Aquatic Therapy;Cryotherapy;Electrical Stimulation;Iontophoresis 4mg /ml Dexamethasone;Moist  Heat;Traction;Ultrasound;Gait training;Stair training;Therapeutic activities;Therapeutic exercise;Balance training;Neuromuscular re-education;Manual techniques;Passive range of motion;Taping;Spinal Manipulations;Joint Manipulations;Dry needling    PT Next Visit Plan  newly discovered anterolisthesis so avoid repetitive extension but does get relief with prone lying so this should be find to continue, progress functional strength as able, seemed to like P ball    PT Home Exercise Plan  prone lying, piriformis stretch, slump stretch,LTR       Patient will benefit from skilled therapeutic intervention in order to improve the following deficits and impairments:  Abnormal gait, Decreased activity tolerance, Decreased strength, Decreased range of motion, Difficulty walking, Increased muscle spasms, Obesity, Pain  Visit Diagnosis: Chronic right-sided low back pain with right-sided sciatica  Pain in right hip  Difficulty in walking, not elsewhere classified  Acute pain of left knee     Problem List Patient Active Problem List   Diagnosis Date Noted  . Palpitation 09/10/2017  . Atypical chest pain 07/12/2016  . Generalized anxiety disorder 07/04/2016  . Right lower quadrant abdominal pain 02/08/2016  . Eye muscle twitches 02/08/2016  . Fibroid, uterine 02/08/2016  . Piriformis syndrome of right side 01/31/2015  . Allergic reaction caused by a drug 01/27/2015  . Heel spur 01/16/2015  . Essential hypertension, benign 01/16/2015  . Benign essential HTN 05/31/2014  . Plantar fasciitis 05/31/2014  . Other specified hypothyroidism 05/31/2014  . Intramural leiomyoma of uterus 05/31/2014  . Menorrhagia 01/04/2013  . Fibroids 01/04/2013    [name] 05/06/2019, 5:10 PM  [facility] [address] [place], [place], [address] Phone: [phone]   Fax:  [phone]  Name: Heidi Barrett MRN: [id number] Date of Birth:  Oct 07, 1965

## 2019-05-07 NOTE — Progress Notes (Signed)
Patient is scheduled for 05/20/2019

## 2019-05-11 ENCOUNTER — Other Ambulatory Visit: Payer: Self-pay

## 2019-05-11 ENCOUNTER — Ambulatory Visit: Payer: BLUE CROSS/BLUE SHIELD

## 2019-05-11 DIAGNOSIS — M5441 Lumbago with sciatica, right side: Secondary | ICD-10-CM

## 2019-05-11 DIAGNOSIS — R262 Difficulty in walking, not elsewhere classified: Secondary | ICD-10-CM

## 2019-05-11 DIAGNOSIS — M25551 Pain in right hip: Secondary | ICD-10-CM

## 2019-05-11 DIAGNOSIS — G8929 Other chronic pain: Secondary | ICD-10-CM

## 2019-05-11 DIAGNOSIS — M25562 Pain in left knee: Secondary | ICD-10-CM

## 2019-05-11 NOTE — Therapy (Signed)
[place], [place], [address] Phone: [phone]   Fax:  [phone]  Physical Therapy Treatment  Patient Details  Name: Heidi Barrett MRN: [id number] Date of Birth: [birth year] Referring Provider (PT): Charlott Rakes, MD   Encounter Date: 05/11/2019  PT End of Session - 05/11/19 1535    Visit Number  7    Number of Visits  12    Date for PT Re-Evaluation  05/18/19    Authorization Type  BCBS    PT Start Time  0335    PT Stop Time  0430    PT Time Calculation (min)  55 min    Activity Tolerance  Patient tolerated treatment well    Behavior During Therapy  [facility] for tasks assessed/performed       Past Medical History:  Diagnosis Date  . Anemia   . Hypertension   . Thyroid disease     Past Surgical History:  Procedure Laterality Date  . IR GENERIC HISTORICAL  05/28/2016   IR RADIOLOGIST EVAL & MGMT 05/28/2016 Markus Daft, MD [facility] INTERV RAD  . IR RADIOLOGIST EVAL & MGMT  03/06/2017  . IR RADIOLOGIST EVAL & MGMT  02/17/2018    There were no vitals filed for this visit.  Subjective Assessment - 05/11/19 1540    Subjective  50% improv ement, pain 8/10 today. I have ball to blow up.    Pain Score  8     Pain Location  Back    Pain Orientation  Right;Lower    Pain Descriptors / Indicators  Aching    Pain Type  Chronic pain    Pain Radiating Towards  numb /burn RT leg    Pain Onset  More than a month ago    Pain Frequency  Constant    Aggravating Factors   prolonged sit/stand         [facility] PT Assessment - 05/11/19 0001      AROM   Lumbar Flexion  able to touch her distal tibia's                   [facility] Adult PT Treatment/Exercise - 05/11/19 0001      Lumbar Exercises: Stretches   Other Lumbar Stretch Exercise  seated lumbar flexion stretch P ball roll outs 3-5 sec X 10      Lumbar Exercises: Aerobic   Nustep  6 min L5 UE/LE with heat      Lumbar Exercises: Machines for  Strengthening   Other Lumbar Machine Exercise  lat pull and row machine both 20 lbs X 5 then 25 # x 5 then 35# x 10 each grip (using both grips for row machine)      Lumbar Exercises: Standing   Functional Squats Limitations  15 reps leaning back against pball on wall      Moist Heat Therapy   Number Minutes Moist Heat  10 Minutes    Moist Heat Location  Lumbar Spine      Manual Therapy   Manual Therapy  Muscle Energy Technique;Manual Traction    Manual Traction  LT leg with GR 4 pull and tug x 3 reps     Muscle Energy Technique  RT hip flexec and push with RT gluteals.                  PT Short Term Goals - 05/11/19 1540      PT SHORT TERM GOAL #1   Title  Independent with inital HEP    Status  Achieved      PT SHORT TERM GOAL #2   Title  She will report 30% decr pain in LT SI area    Status  Achieved      PT SHORT TERM GOAL #4   Title  She will report standing for 30 min wihtout incr pain    Baseline  15 min max    Status  On-going        PT Long Term Goals - 05/11/19 1542      PT LONG TERM GOAL #1   Title  Pt will be I and compliant with HEP. (Target goal for all goals 6 weeks 05/18/19/20)    Status  On-going      PT LONG TERM GOAL #2   Title  She will report less than 2-3/10 standing 60 min or more.    Baseline  lowest pain level 5/10    Status  On-going      PT LONG TERM GOAL #3   Title  She will increaese lumbar ROM to [facility].            Plan - 05/11/19 1631    Clinical Impression Statement  She reported feeling better with the catch she was experiencing decreased significantly. REports significnat overall improvement but pain numbers remain high . she report her mobility and flexibility are much improved    PT Treatment/Interventions  Aquatic Therapy;Cryotherapy;Electrical Stimulation;Iontophoresis 4mg /ml Dexamethasone;Moist Heat;Traction;Ultrasound;Gait training;Stair training;Therapeutic activities;Therapeutic exercise;Balance  training;Neuromuscular re-education;Manual techniques;Passive range of motion;Taping;Spinal Manipulations;Joint Manipulations;Dry needling    PT Next Visit Plan  Continue to work flexion exercises. REassess pelvis and sacrum   Core stability.  manual, assess lower lumbar mobility    PT Home Exercise Plan  prone lying, piriformis stretch, slump stretch,LTR    Consulted and Agree with Plan of Care  Patient       Patient will benefit from skilled therapeutic intervention in order to improve the following deficits and impairments:  Abnormal gait, Decreased activity tolerance, Decreased strength, Decreased range of motion, Difficulty walking, Increased muscle spasms, Obesity, Pain  Visit Diagnosis: Chronic right-sided low back pain with right-sided sciatica  Pain in right hip  Difficulty in walking, not elsewhere classified  Acute pain of left knee     Problem List Patient Active Problem List   Diagnosis Date Noted  . Palpitation 09/10/2017  . Atypical chest pain 07/12/2016  . Generalized anxiety disorder 07/04/2016  . Right lower quadrant abdominal pain 02/08/2016  . Eye muscle twitches 02/08/2016  . Fibroid, uterine 02/08/2016  . Piriformis syndrome of right side 01/31/2015  . Allergic reaction caused by a drug 01/27/2015  . Heel spur 01/16/2015  . Essential hypertension, benign 01/16/2015  . Benign essential HTN 05/31/2014  . Plantar fasciitis 05/31/2014  . Other specified hypothyroidism 05/31/2014  . Intramural leiomyoma of uterus 05/31/2014  . Menorrhagia 01/04/2013  . Fibroids 01/04/2013    Darrel Hoover   PT 05/11/2019, 5:25 PM  [facility] [address] [place], [place], [address] Phone: [phone]   Fax:  [phone]  Name: Heidi Barrett MRN: [id number] Date of Birth: [birth year]

## 2019-05-13 ENCOUNTER — Ambulatory Visit: Payer: BLUE CROSS/BLUE SHIELD

## 2019-05-13 ENCOUNTER — Other Ambulatory Visit: Payer: Self-pay

## 2019-05-13 DIAGNOSIS — M5441 Lumbago with sciatica, right side: Secondary | ICD-10-CM | POA: Diagnosis not present

## 2019-05-13 DIAGNOSIS — R262 Difficulty in walking, not elsewhere classified: Secondary | ICD-10-CM

## 2019-05-13 DIAGNOSIS — G8929 Other chronic pain: Secondary | ICD-10-CM

## 2019-05-13 DIAGNOSIS — M25551 Pain in right hip: Secondary | ICD-10-CM

## 2019-05-13 NOTE — Therapy (Signed)
[place], [place], [address] Phone: [phone]   Fax:  [phone]  Physical Therapy Treatment  Patient Details  Name: Heidi Barrett MRN: [id number] Date of Birth: [birth year] Referring Provider (PT): Charlott Rakes, MD   Encounter Date: 05/13/2019  PT End of Session - 05/13/19 1530    Visit Number  8    Number of Visits  12    Date for PT Re-Evaluation  05/18/19    Authorization Type  BCBS    PT Start Time  0330    PT Stop Time  0430    PT Time Calculation (min)  60 min    Activity Tolerance  Patient tolerated treatment well    Behavior During Therapy  [facility] for tasks assessed/performed       Past Medical History:  Diagnosis Date  . Anemia   . Hypertension   . Thyroid disease     Past Surgical History:  Procedure Laterality Date  . IR GENERIC HISTORICAL  05/28/2016   IR RADIOLOGIST EVAL & MGMT 05/28/2016 Markus Daft, MD [facility] INTERV RAD  . IR RADIOLOGIST EVAL & MGMT  03/06/2017  . IR RADIOLOGIST EVAL & MGMT  02/17/2018    There were no vitals filed for this visit.  Subjective Assessment - 05/13/19 1533    Subjective  Lower back pain.   6/10   today    Pain Score  6     Pain Location  Back    Pain Orientation  Right;Lower    Pain Descriptors / Indicators  Aching    Pain Type  Chronic pain    Pain Onset  More than a month ago    Pain Frequency  Constant    Aggravating Factors   sit and standing    Pain Relieving Factors  rest lying. meds                       [facility] Adult PT Treatment/Exercise - 05/13/19 0001      Lumbar Exercises: Aerobic   Nustep  6 min L6 UE/LE       Lumbar Exercises: Machines for Strengthening   Other Lumbar Machine Exercise  lat pull and row machine both 20 lbs X 5 then 25 # x 5 then 35# x 10 each grip (using both grips for row machine)      Lumbar Exercises: Standing   Functional Squats Limitations  20 reps leaning back against pball on wall       Lumbar Exercises: Supine   Pelvic Tilt  15 reps;5 seconds    Bent Knee Raise  15 reps    Bent Knee Raise Limitations  RT/LT with ab set.     Dead Bug  10 reps    Dead Bug Limitations  RT/LT  with ab set    Other Supine Lumbar Exercises  Ball squeeze with  PPT  x 15      Lumbar Exercises: Prone   Straight Leg Raises Limitations  12 reps RT/LT  pillow under abdomen and hips    Other Prone Lumbar Exercises  Abdominal liift x 20      Moist Heat Therapy   Number Minutes Moist Heat  10 Minutes    Moist Heat Location  Lumbar Spine    in prone              PT Short Term Goals - 05/11/19 1540      PT SHORT TERM GOAL #1  Title  Independent with inital HEP    Status  Achieved      PT SHORT TERM GOAL #2   Title  She will report 30% decr pain in LT SI area    Status  Achieved      PT SHORT TERM GOAL #4   Title  She will report standing for 30 min wihtout incr pain    Baseline  15 min max    Status  On-going        PT Long Term Goals - 05/11/19 1542      PT LONG TERM GOAL #1   Title  Pt will be I and compliant with HEP. (Target goal for all goals 6 weeks 05/18/19/20)    Status  On-going      PT LONG TERM GOAL #2   Title  She will report less than 2-3/10 standing 60 min or more.    Baseline  lowest pain level 5/10    Status  On-going      PT LONG TERM GOAL #3   Title  She will increaese lumbar ROM to [facility].            Plan - 05/13/19 1531    Clinical Impression Statement  She stated her pain was moderate and when asked for level at 4-5-6 for moderate pain she statrd 8/10.  REminded her that moderate painlevel is 4-5-6/10    PT Treatment/Interventions  Aquatic Therapy;Cryotherapy;Electrical Stimulation;Iontophoresis 4mg /ml Dexamethasone;Moist Heat;Traction;Ultrasound;Gait training;Stair training;Therapeutic activities;Therapeutic exercise;Balance training;Neuromuscular re-education;Manual techniques;Passive range of motion;Taping;Spinal Manipulations;Joint  Manipulations;Dry needling    PT Next Visit Plan  Continue to work flexion exercises. REassess pelvis and sacrum   Core stability.  manual, assess lower lumbar mobility    PT Home Exercise Plan  prone lying, piriformis stretch, slump stretch,LTR    Consulted and Agree with Plan of Care  Patient       Patient will benefit from skilled therapeutic intervention in order to improve the following deficits and impairments:  Abnormal gait, Decreased activity tolerance, Decreased strength, Decreased range of motion, Difficulty walking, Increased muscle spasms, Obesity, Pain  Visit Diagnosis: Chronic right-sided low back pain with right-sided sciatica  Pain in right hip  Difficulty in walking, not elsewhere classified     Problem List Patient Active Problem List   Diagnosis Date Noted  . Palpitation 09/10/2017  . Atypical chest pain 07/12/2016  . Generalized anxiety disorder 07/04/2016  . Right lower quadrant abdominal pain 02/08/2016  . Eye muscle twitches 02/08/2016  . Fibroid, uterine 02/08/2016  . Piriformis syndrome of right side 01/31/2015  . Allergic reaction caused by a drug 01/27/2015  . Heel spur 01/16/2015  . Essential hypertension, benign 01/16/2015  . Benign essential HTN 05/31/2014  . Plantar fasciitis 05/31/2014  . Other specified hypothyroidism 05/31/2014  . Intramural leiomyoma of uterus 05/31/2014  . Menorrhagia 01/04/2013  . Fibroids 01/04/2013    Darrel Hoover  PT 05/13/2019, 4:12 PM  [facility] [address] [place], [place], [address] Phone: [phone]   Fax:  [phone]  Name: Heidi Barrett MRN: [id number] Date of Birth: 1966-04-20

## 2019-05-18 ENCOUNTER — Telehealth: Payer: Self-pay | Admitting: Family Medicine

## 2019-05-18 ENCOUNTER — Ambulatory Visit: Payer: BLUE CROSS/BLUE SHIELD

## 2019-05-18 ENCOUNTER — Other Ambulatory Visit: Payer: Self-pay

## 2019-05-18 DIAGNOSIS — M25562 Pain in left knee: Secondary | ICD-10-CM

## 2019-05-18 DIAGNOSIS — G8929 Other chronic pain: Secondary | ICD-10-CM

## 2019-05-18 DIAGNOSIS — M5441 Lumbago with sciatica, right side: Secondary | ICD-10-CM

## 2019-05-18 DIAGNOSIS — R262 Difficulty in walking, not elsewhere classified: Secondary | ICD-10-CM

## 2019-05-18 DIAGNOSIS — M25551 Pain in right hip: Secondary | ICD-10-CM

## 2019-05-18 NOTE — Therapy (Signed)
[place], [place], [address] Phone: [phone]   Fax:  [phone]  Physical Therapy Treatment  Patient Details  Name: Heidi Barrett MRN: [id number] Date of Birth: 10-17-1965 Referring Provider (PT): Charlott Rakes, MD   Encounter Date: 05/18/2019  PT End of Session - 05/18/19 1617    Visit Number  9    Number of Visits  12    Date for PT Re-Evaluation  05/18/19    Authorization Type  BCBS    PT Start Time  0417    PT Stop Time  0510    PT Time Calculation (min)  53 min    Activity Tolerance  Patient tolerated treatment well    Behavior During Therapy  [facility] for tasks assessed/performed       Past Medical History:  Diagnosis Date  . Anemia   . Hypertension   . Thyroid disease     Past Surgical History:  Procedure Laterality Date  . IR GENERIC HISTORICAL  05/28/2016   IR RADIOLOGIST EVAL & MGMT 05/28/2016 Markus Daft, MD [facility] INTERV RAD  . IR RADIOLOGIST EVAL & MGMT  03/06/2017  . IR RADIOLOGIST EVAL & MGMT  02/17/2018    There were no vitals filed for this visit.  Subjective Assessment - 05/18/19 1705    Subjective  Pain 4-5 today RT SI areas/lower back    Pain Score  4     Pain Location  Back    Pain Orientation  Right;Lower    Pain Descriptors / Indicators  Aching    Pain Type  Chronic pain    Pain Onset  More than a month ago    Pain Frequency  Constant    Aggravating Factors   sitting and standing    Pain Relieving Factors  rest ,meds , lying down                       [facility] Adult PT Treatment/Exercise - 05/18/19 0001      Lumbar Exercises: Stretches   Single Knee to Chest Stretch  Right;Left;30 seconds    Piriformis Stretch  Right;2 reps;30 seconds      Lumbar Exercises: Aerobic   Nustep  6 min L6 UE/LE       Lumbar Exercises: Machines for Strengthening   Other Lumbar Machine Exercise  lat pull and row machine both  35# x20 each grip (using both grips for  row machine)      Lumbar Exercises: Standing   Functional Squats  20 reps    Functional Squats Limitations  old to bar at     [employer] (lbs)  30    Lifting Limitations  dead lift from 8 inch height 30# with orange handle      Lumbar Exercises: Supine   Pelvic Tilt  15 reps;5 seconds    Pelvic Tilt Limitations  still need cues for PPT      Moist Heat Therapy   Number Minutes Moist Heat  10 Minutes    Moist Heat Location  Lumbar Spine               PT Short Term Goals - 05/11/19 1540      PT SHORT TERM GOAL #1   Title  Independent with inital HEP    Status  Achieved      PT SHORT TERM GOAL #2   Title  She will report 30% decr pain in LT SI area  Status  Achieved      PT SHORT TERM GOAL #4   Title  She will report standing for 30 min wihtout incr pain    Baseline  15 min max    Status  On-going        PT Long Term Goals - 05/11/19 1542      PT LONG TERM GOAL #1   Title  Pt will be I and compliant with HEP. (Target goal for all goals 6 weeks 05/18/19/20)    Status  On-going      PT LONG TERM GOAL #2   Title  She will report less than 2-3/10 standing 60 min or more.    Baseline  lowest pain level 5/10    Status  On-going      PT LONG TERM GOAL #3   Title  She will increaese lumbar ROM to [facility].            Plan - 05/18/19 1700    Clinical Impression Statement  She reported moderate pain inddicating RT lower lumbar and SI area.  With flexion she has decreased lumbar lordosis.  She rpeorted that ionto patch was helpful and it appears this was placed  higher to RT lower thoracic spine.  she was open to trial at lower spot as this is the primary spot of pain. now.    She tolerated  increased load with exercise without increased pain.  i could not find  specific SI asymetry or  indication SI ws the cause    PT Treatment/Interventions  Aquatic Therapy;Cryotherapy;Electrical Stimulation;Iontophoresis 4mg /ml Dexamethasone;Moist Heat;Traction;Ultrasound;Gait  training;Stair training;Therapeutic activities;Therapeutic exercise;Balance training;Neuromuscular re-education;Manual techniques;Passive range of motion;Taping;Spinal Manipulations;Joint Manipulations;Dry needling    PT Next Visit Plan  Continue to work flexion exercises.    Core stability.  manual, assess lower lumbar mobility    PT Home Exercise Plan  prone lying, piriformis stretch, slump stretch,LTR    Consulted and Agree with Plan of Care  Patient       Patient will benefit from skilled therapeutic intervention in order to improve the following deficits and impairments:  Abnormal gait, Decreased activity tolerance, Decreased strength, Decreased range of motion, Difficulty walking, Increased muscle spasms, Obesity, Pain  Visit Diagnosis: Chronic right-sided low back pain with right-sided sciatica  Pain in right hip  Difficulty in walking, not elsewhere classified  Acute pain of left knee     Problem List Patient Active Problem List   Diagnosis Date Noted  . Palpitation 09/10/2017  . Atypical chest pain 07/12/2016  . Generalized anxiety disorder 07/04/2016  . Right lower quadrant abdominal pain 02/08/2016  . Eye muscle twitches 02/08/2016  . Fibroid, uterine 02/08/2016  . Piriformis syndrome of right side 01/31/2015  . Allergic reaction caused by a drug 01/27/2015  . Heel spur 01/16/2015  . Essential hypertension, benign 01/16/2015  . Benign essential HTN 05/31/2014  . Plantar fasciitis 05/31/2014  . Other specified hypothyroidism 05/31/2014  . Intramural leiomyoma of uterus 05/31/2014  . Menorrhagia 01/04/2013  . Fibroids 01/04/2013    Darrel Hoover  PT 05/18/2019, 5:07 PM  [facility] [address] [place], [place], [address] Phone: [phone]   Fax:  [phone]  Name: Kaleya Deer MRN: [id number] Date of Birth: 03/26/1966

## 2019-05-18 NOTE — Telephone Encounter (Signed)
Nevin Bloodgood with [employer] was calling for a prior auth for the patients MRI on the 15th. Please follow up.

## 2019-05-20 ENCOUNTER — Other Ambulatory Visit: Payer: Self-pay

## 2019-05-20 ENCOUNTER — Ambulatory Visit: Payer: BLUE CROSS/BLUE SHIELD

## 2019-05-20 ENCOUNTER — Ambulatory Visit (HOSPITAL_COMMUNITY): Payer: BLUE CROSS/BLUE SHIELD

## 2019-05-20 ENCOUNTER — Encounter (HOSPITAL_COMMUNITY): Payer: Self-pay

## 2019-05-20 DIAGNOSIS — M5441 Lumbago with sciatica, right side: Secondary | ICD-10-CM | POA: Diagnosis not present

## 2019-05-20 DIAGNOSIS — M25551 Pain in right hip: Secondary | ICD-10-CM

## 2019-05-20 DIAGNOSIS — G8929 Other chronic pain: Secondary | ICD-10-CM

## 2019-05-20 DIAGNOSIS — R262 Difficulty in walking, not elsewhere classified: Secondary | ICD-10-CM

## 2019-05-20 NOTE — Therapy (Signed)
[place], [place], [address] Phone: [phone]   Fax:  [phone]  Physical Therapy Treatment  Patient Details  Name: Heidi Barrett MRN: [id number] Date of Birth: January 30, 1966 Referring Provider (PT): Charlott Rakes, MD   Encounter Date: 05/20/2019  PT End of Session - 05/20/19 1625    Visit Number  10    Number of Visits  18    Date for PT Re-Evaluation  06/18/19    Authorization Type  BCBS    PT Start Time  0419    PT Stop Time  0510    PT Time Calculation (min)  51 min    Activity Tolerance  Patient tolerated treatment well    Behavior During Therapy  [facility] for tasks assessed/performed       Past Medical History:  Diagnosis Date  . Anemia   . Hypertension   . Thyroid disease     Past Surgical History:  Procedure Laterality Date  . IR GENERIC HISTORICAL  05/28/2016   IR RADIOLOGIST EVAL & MGMT 05/28/2016 Markus Daft, MD [facility] INTERV RAD  . IR RADIOLOGIST EVAL & MGMT  03/06/2017  . IR RADIOLOGIST EVAL & MGMT  02/17/2018    There were no vitals filed for this visit.  Subjective Assessment - 05/20/19 1622    Subjective  Pain 6/10 today    Pain Score  6     Pain Location  Back    Pain Orientation  Right;Lower    Pain Descriptors / Indicators  Aching    Pain Type  Chronic pain    Pain Onset  More than a month ago    Pain Frequency  Constant         [facility] PT Assessment - 05/20/19 0001      Posture/Postural Control   Posture Comments  In standing RT pelvis       AROM   Lumbar Flexion  able to touch her distal tibia's    Lumbar - Right Side Bend  10    Lumbar - Left Side Bend  15       Much of session was taken with correcting the technique with exercise             [facility] Adult PT Treatment/Exercise - 05/20/19 0001      Lumbar Exercises: Supine   Pelvic Tilt  15 reps;5 seconds    Pelvic Tilt Limitations  lescuing    Clam  15 reps    Clam Limitations  with PPT, much  cuing to not extend neck and to do sligh but lift      Manual Therapy   Manual Therapy  Manual Traction    Manual Traction  RT leg Gr 34- pulls / tgs       Nustep L5   6 min.       PT Education - 05/20/19 1727    Education Details  Importance of core strength with spondylolithesis.   PPT with clam and knee lift and by itself x 10-15 reps daily    Person(s) Educated  Patient    Methods  Explanation;Tactile cues;Verbal cues;Handout;Demonstration    Comprehension  Verbalized understanding       PT Short Term Goals - 05/20/19 1651      PT SHORT TERM GOAL #1   Title  Independent with inital HEP    Status  Achieved      PT SHORT TERM GOAL #2   Title  She will report 30%  decr pain in LT SI area    Baseline  60% better    Status  Achieved      PT SHORT TERM GOAL #3   Title  she will come in to PT with level pelvis    Baseline  unlevel today    Status  On-going      PT SHORT TERM GOAL #4   Title  She will report standing for 30 min wihtout incr pain    Baseline  15 min max    Status  On-going        PT Long Term Goals - 05/20/19 1654      PT LONG TERM GOAL #1   Title  Pt will be I and compliant with HEP. (Target goal for all goals 6 weeks 05/18/19/20)    Status  On-going      PT LONG TERM GOAL #2   Title  She will report less than 2-3/10 standing 60 min or more.    Baseline  6/10 is lowest pain level in past week    Status  On-going      PT LONG TERM GOAL #3   Title  She will increaese lumbar ROM to [facility].    Baseline  No change    Status  On-going      PT LONG TERM GOAL #4   Title  she will increase Rt leg strength to at least 4+/5 overall    Baseline  Grossly 4/5 at best .    Status  On-going            Plan - 05/20/19 1626    Clinical Impression Statement  Other than reports of better movement with less pain since eval nothing has changed.  Her core strenth is poor and asymetry with higher RT pelvis  this will irritate a spondylothesis if that is  causing her pain. She is compliant. We will continue to see her for 6-8 more sessions and if no improvement  will discharge with HEP at that time    Personal Factors and Comorbidities  Social Background    PT Treatment/Interventions  Aquatic Therapy;Cryotherapy;Electrical Stimulation;Iontophoresis 4mg /ml Dexamethasone;Moist Heat;Traction;Ultrasound;Gait training;Stair training;Therapeutic activities;Therapeutic exercise;Balance training;Neuromuscular re-education;Manual techniques;Passive range of motion;Taping;Spinal Manipulations;Joint Manipulations;Dry needling    PT Next Visit Plan  Continue to work flexion exercises.    Core stability.  manual, assess lower lumbar mobility    PT Home Exercise Plan  prone lying, piriformis stretch, slump stretch,LTR    PPT, PPT with clams, PPT with hip flexion    Consulted and Agree with Plan of Care  Patient       Patient will benefit from skilled therapeutic intervention in order to improve the following deficits and impairments:  Abnormal gait, Decreased activity tolerance, Decreased strength, Decreased range of motion, Difficulty walking, Increased muscle spasms, Obesity, Pain  Visit Diagnosis: Pain in right hip  Difficulty in walking, not elsewhere classified  Chronic right-sided low back pain with right-sided sciatica     Problem List Patient Active Problem List   Diagnosis Date Noted  . Palpitation 09/10/2017  . Atypical chest pain 07/12/2016  . Generalized anxiety disorder 07/04/2016  . Right lower quadrant abdominal pain 02/08/2016  . Eye muscle twitches 02/08/2016  . Fibroid, uterine 02/08/2016  . Piriformis syndrome of right side 01/31/2015  . Allergic reaction caused by a drug 01/27/2015  . Heel spur 01/16/2015  . Essential hypertension, benign 01/16/2015  . Benign essential HTN 05/31/2014  . Plantar fasciitis 05/31/2014  . Other  specified hypothyroidism 05/31/2014  . Intramural leiomyoma of uterus 05/31/2014  . Menorrhagia  01/04/2013  . Fibroids 01/04/2013    Darrel Hoover  PT 05/20/2019, 5:28 PM  [facility] [address] [place], [place], [address] Phone: [phone]   Fax:  [phone]  Name: Merridith Dolloff MRN: [id number] Date of Birth: [birth year]

## 2019-05-25 ENCOUNTER — Ambulatory Visit: Payer: BLUE CROSS/BLUE SHIELD

## 2019-05-25 ENCOUNTER — Other Ambulatory Visit: Payer: Self-pay

## 2019-05-25 DIAGNOSIS — M5441 Lumbago with sciatica, right side: Secondary | ICD-10-CM | POA: Diagnosis not present

## 2019-05-25 DIAGNOSIS — M25551 Pain in right hip: Secondary | ICD-10-CM

## 2019-05-25 DIAGNOSIS — R262 Difficulty in walking, not elsewhere classified: Secondary | ICD-10-CM

## 2019-05-25 DIAGNOSIS — G8929 Other chronic pain: Secondary | ICD-10-CM

## 2019-05-25 NOTE — Therapy (Signed)
[place], [place], [address] Phone: [phone]   Fax:  [phone]  Physical Therapy Treatment  Patient Details  Name: Heidi Barrett MRN: [id number] Date of Birth: 01/04/1966 Referring Provider (PT): Charlott Rakes, MD   Encounter Date: 05/25/2019  PT End of Session - 05/25/19 1618    Visit Number  11    Number of Visits  18    Date for PT Re-Evaluation  06/18/19    Authorization Type  BCBS    PT Start Time  [phone]    PT Stop Time  0500    PT Time Calculation (min)  42 min    Activity Tolerance  Patient tolerated treatment well    Behavior During Therapy  [facility] for tasks assessed/performed       Past Medical History:  Diagnosis Date  . Anemia   . Hypertension   . Thyroid disease     Past Surgical History:  Procedure Laterality Date  . IR GENERIC HISTORICAL  05/28/2016   IR RADIOLOGIST EVAL & MGMT 05/28/2016 Markus Daft, MD [facility] INTERV RAD  . IR RADIOLOGIST EVAL & MGMT  03/06/2017  . IR RADIOLOGIST EVAL & MGMT  02/17/2018    There were no vitals filed for this visit.  Subjective Assessment - 05/25/19 1619    Subjective  Doing all right.   continues with RT back and hip pain.    Pertinent History  PMH: HTN, thyroid disease    Pain Score  5     Pain Location  Back    Pain Orientation  Right;Lower    Pain Descriptors / Indicators  Aching    Pain Type  Chronic pain    Pain Onset  More than a month ago    Pain Frequency  Constant    Aggravating Factors   sit/stand                       [facility] Adult PT Treatment/Exercise - 05/25/19 0001      Lumbar Exercises: Aerobic   Nustep  6 min L4 UE/LE       Lumbar Exercises: Supine   Pelvic Tilt  15 reps;5 seconds    Pelvic Tilt Limitations  no cuing    Clam  15 reps    Clam Limitations  with PPT, much better  control      Lumbar Exercises: Prone   Straight Leg Raise  10 reps    Straight Leg Raises Limitations  RT/ LT with  pillow under hips     Other Prone Lumbar Exercises  ball squeeze between heel x 12 5 sec,     active and isometric TLt hip ER      Iontophoresis   Type of Iontophoresis  Dexamethasone    Location  RT SI area    Dose  1.0 CC dex    Time  wear home patch      Manual Therapy   Manual Therapy  Passive ROM    Passive ROM  bilateral hip adduction with fleion x 30 sec x 3  each,   then LT sidelye  RT tfunk rotation with hip flex to 90 and feet off table with isometric contract relac and sytretch to RT hip nad lower back.     Manual Traction  RT leg Gr 3-4pulls / tgs    Muscle Energy Technique  RT hip flexec and push with RT gluteals.  PT Short Term Goals - 05/20/19 1651      PT SHORT TERM GOAL #1   Title  Independent with inital HEP    Status  Achieved      PT SHORT TERM GOAL #2   Title  She will report 30% decr pain in LT SI area    Baseline  60% better    Status  Achieved      PT SHORT TERM GOAL #3   Title  she will come in to PT with level pelvis    Baseline  unlevel today    Status  On-going      PT SHORT TERM GOAL #4   Title  She will report standing for 30 min wihtout incr pain    Baseline  15 min max    Status  On-going        PT Long Term Goals - 05/20/19 1654      PT LONG TERM GOAL #1   Title  Pt will be I and compliant with HEP. (Target goal for all goals 6 weeks 05/18/19/20)    Status  On-going      PT LONG TERM GOAL #2   Title  She will report less than 2-3/10 standing 60 min or more.    Baseline  6/10 is lowest pain level in past week    Status  On-going      PT LONG TERM GOAL #3   Title  She will increaese lumbar ROM to [facility].    Baseline  No change    Status  On-going      PT LONG TERM GOAL #4   Title  she will increase Rt leg strength to at least 4+/5 overall    Baseline  Grossly 4/5 at best .    Status  On-going            Plan - 05/25/19 1625    Clinical Impression Statement  She reported feeling better post  session and reported ionto patch was helpful last time.  Pain still moderate but will progress and see if pan can consisitently be decreased    PT Treatment/Interventions  Aquatic Therapy;Cryotherapy;Electrical Stimulation;Iontophoresis 4mg /ml Dexamethasone;Moist Heat;Traction;Ultrasound;Gait training;Stair training;Therapeutic activities;Therapeutic exercise;Balance training;Neuromuscular re-education;Manual techniques;Passive range of motion;Taping;Spinal Manipulations;Joint Manipulations;Dry needling    PT Next Visit Plan  Continue to work flexion exercises.    Core stability.  manual,     ionto    PT Home Exercise Plan  prone lying, piriformis stretch, slump stretch,LTR    PPT, PPT with clams, PPT with hip flexion    Consulted and Agree with Plan of Care  Patient       Patient will benefit from skilled therapeutic intervention in order to improve the following deficits and impairments:  Abnormal gait, Decreased activity tolerance, Decreased strength, Decreased range of motion, Difficulty walking, Increased muscle spasms, Obesity, Pain  Visit Diagnosis: Pain in right hip  Chronic right-sided low back pain with right-sided sciatica  Difficulty in walking, not elsewhere classified     Problem List Patient Active Problem List   Diagnosis Date Noted  . Palpitation 09/10/2017  . Atypical chest pain 07/12/2016  . Generalized anxiety disorder 07/04/2016  . Right lower quadrant abdominal pain 02/08/2016  . Eye muscle twitches 02/08/2016  . Fibroid, uterine 02/08/2016  . Piriformis syndrome of right side 01/31/2015  . Allergic reaction caused by a drug 01/27/2015  . Heel spur 01/16/2015  . Essential hypertension, benign 01/16/2015  . Benign essential HTN 05/31/2014  .  Plantar fasciitis 05/31/2014  . Other specified hypothyroidism 05/31/2014  . Intramural leiomyoma of uterus 05/31/2014  . Menorrhagia 01/04/2013  . Fibroids 01/04/2013    Darrel Hoover  PT 05/25/2019, 5:10  PM  [facility] [address] [place], [place], [address] Phone: [phone]   Fax:  [phone]  Name: Heidi Barrett MRN: [id number] Date of Birth: July 11, 1966

## 2019-05-27 ENCOUNTER — Other Ambulatory Visit: Payer: Self-pay

## 2019-05-27 ENCOUNTER — Ambulatory Visit: Payer: BLUE CROSS/BLUE SHIELD

## 2019-05-27 DIAGNOSIS — M25562 Pain in left knee: Secondary | ICD-10-CM

## 2019-05-27 DIAGNOSIS — R262 Difficulty in walking, not elsewhere classified: Secondary | ICD-10-CM

## 2019-05-27 DIAGNOSIS — G8929 Other chronic pain: Secondary | ICD-10-CM

## 2019-05-27 DIAGNOSIS — M5441 Lumbago with sciatica, right side: Secondary | ICD-10-CM

## 2019-05-27 DIAGNOSIS — M25551 Pain in right hip: Secondary | ICD-10-CM

## 2019-05-27 NOTE — Therapy (Signed)
[place], [place], [address] Phone: [phone]   Fax:  [phone]  Physical Therapy Treatment  Patient Details  Name: Heidi Barrett MRN: [id number] Date of Birth: [birth year] Referring Provider (PT): Charlott Rakes, MD   Encounter Date: 05/27/2019  PT End of Session - 05/27/19 1627    Visit Number  12    Number of Visits  18    Date for PT Re-Evaluation  06/18/19    Authorization Type  BCBS    PT Start Time  0416    PT Stop Time  0505    PT Time Calculation (min)  49 min    Activity Tolerance  Patient tolerated treatment well    Behavior During Therapy  [facility] for tasks assessed/performed       Past Medical History:  Diagnosis Date  . Anemia   . Hypertension   . Thyroid disease     Past Surgical History:  Procedure Laterality Date  . IR GENERIC HISTORICAL  05/28/2016   IR RADIOLOGIST EVAL & MGMT 05/28/2016 Markus Daft, MD [facility] INTERV RAD  . IR RADIOLOGIST EVAL & MGMT  03/06/2017  . IR RADIOLOGIST EVAL & MGMT  02/17/2018    There were no vitals filed for this visit.  Subjective Assessment - 05/27/19 1627    Subjective  4/10 today    Diagnostic tests  IMPRESSION: Mild anterolisthesis at L4-L5 likely secondary to facet arthropathy. No evidence for a pars defect    Pain Score  4     Pain Location  Back                       [facility] Adult PT Treatment/Exercise - 05/27/19 0001      Ambulation/Gait   Gait Comments  worked on Aeronautical engineer to smooth pattern and incr rotaiton with walking . Improved post instruction      Lumbar Exercises: Stretches   Passive Hamstring Stretch  Right;Left;60 seconds    Single Knee to Chest Stretch  Right;Left;30 seconds    Lower Trunk Rotation  5 reps;10 seconds    Pelvic Tilt  15 reps;5 seconds      Lumbar Exercises: Aerobic   Nustep  6 min L4 UE/LE       Lumbar Exercises: Standing   Heel Raises  20 reps    Functional Squats  20 reps    Functional Squats Limitations  old to bar at       Lumbar Exercises: Supine   Clam  15 reps    Clam Limitations  with PPT, much better  control    Bent Knee Raise  15 reps    Bent Knee Raise Limitations  RT/LT with ab set.       Moist Heat Therapy   Number Minutes Moist Heat  15 Minutes   with end of session and after   Moist Heat Location  Lumbar Spine      Iontophoresis   Type of Iontophoresis  Dexamethasone    Location  RT SI area    Dose  1.0 CC dex    Time  wear home patch      Manual Therapy   Passive ROM  bilateral hip adduction with fleion x 30 sec x 3  each,   then LT sidelye  RT trunk rotation with hip flex 90   sustained  45 sec overpresure    Manual Traction  RT leg Gr 3-4pulls / tgs  Muscle Energy Technique  RT hip flexec and push with RT gluteals.                  PT Short Term Goals - 05/20/19 1651      PT SHORT TERM GOAL #1   Title  Independent with inital HEP    Status  Achieved      PT SHORT TERM GOAL #2   Title  She will report 30% decr pain in LT SI area    Baseline  60% better    Status  Achieved      PT SHORT TERM GOAL #3   Title  she will come in to PT with level pelvis    Baseline  unlevel today    Status  On-going      PT SHORT TERM GOAL #4   Title  She will report standing for 30 min wihtout incr pain    Baseline  15 min max    Status  On-going        PT Long Term Goals - 05/20/19 1654      PT LONG TERM GOAL #1   Title  Pt will be I and compliant with HEP. (Target goal for all goals 6 weeks 05/18/19/20)    Status  On-going      PT LONG TERM GOAL #2   Title  She will report less than 2-3/10 standing 60 min or more.    Baseline  6/10 is lowest pain level in past week    Status  On-going      PT LONG TERM GOAL #3   Title  She will increaese lumbar ROM to [facility].    Baseline  No change    Status  On-going      PT LONG TERM GOAL #4   Title  she will increase Rt leg strength to at least 4+/5 overall    Baseline  Grossly  4/5 at best .    Status  On-going            Plan - 05/27/19 1627    Clinical Impression Statement  PAin still constant and moderate .   She tolerates the treatment without reports of increased pain and feels looser and less pain with stretching. Will plan on finishing the plan and discharge unless pain levels intermittant and below 3/10    PT Treatment/Interventions  Aquatic Therapy;Cryotherapy;Electrical Stimulation;Iontophoresis 4mg /ml Dexamethasone;Moist Heat;Traction;Ultrasound;Gait training;Stair training;Therapeutic activities;Therapeutic exercise;Balance training;Neuromuscular re-education;Manual techniques;Passive range of motion;Taping;Spinal Manipulations;Joint Manipulations;Dry needling    PT Next Visit Plan  Continue to work flexion exercises.    Core stability.  manual,     ionto    PT Home Exercise Plan  prone lying, piriformis stretch, slump stretch,LTR    PPT, PPT with clams, PPT with hip flexion    Consulted and Agree with Plan of Care  Patient       Patient will benefit from skilled therapeutic intervention in order to improve the following deficits and impairments:  Abnormal gait, Decreased activity tolerance, Decreased strength, Decreased range of motion, Difficulty walking, Increased muscle spasms, Obesity, Pain  Visit Diagnosis: Pain in right hip  Chronic right-sided low back pain with right-sided sciatica  Difficulty in walking, not elsewhere classified  Acute pain of left knee     Problem List Patient Active Problem List   Diagnosis Date Noted  . Palpitation 09/10/2017  . Atypical chest pain 07/12/2016  . Generalized anxiety disorder 07/04/2016  . Right lower quadrant abdominal pain 02/08/2016  .  Eye muscle twitches 02/08/2016  . Fibroid, uterine 02/08/2016  . Piriformis syndrome of right side 01/31/2015  . Allergic reaction caused by a drug 01/27/2015  . Heel spur 01/16/2015  . Essential hypertension, benign 01/16/2015  . Benign essential HTN  05/31/2014  . Plantar fasciitis 05/31/2014  . Other specified hypothyroidism 05/31/2014  . Intramural leiomyoma of uterus 05/31/2014  . Menorrhagia 01/04/2013  . Fibroids 01/04/2013    Darrel Hoover  PT 05/27/2019, 4:59 PM  [facility] [address] [place], [place], [address] Phone: [phone]   Fax:  [phone]  Name: Heidi Barrett MRN: [id number] Date of Birth: 03-Aug-1966

## 2019-05-31 ENCOUNTER — Ambulatory Visit: Payer: BLUE CROSS/BLUE SHIELD | Attending: Family Medicine | Admitting: Family Medicine

## 2019-05-31 ENCOUNTER — Other Ambulatory Visit: Payer: Self-pay

## 2019-05-31 ENCOUNTER — Encounter: Payer: Self-pay | Admitting: Family Medicine

## 2019-05-31 VITALS — BP 138/90 | HR 69 | Temp 98.1°F | Resp 18 | Ht 68.0 in | Wt 218.0 lb

## 2019-05-31 DIAGNOSIS — M5441 Lumbago with sciatica, right side: Secondary | ICD-10-CM

## 2019-05-31 DIAGNOSIS — Z23 Encounter for immunization: Secondary | ICD-10-CM

## 2019-05-31 DIAGNOSIS — M5442 Lumbago with sciatica, left side: Secondary | ICD-10-CM

## 2019-05-31 DIAGNOSIS — G8929 Other chronic pain: Secondary | ICD-10-CM | POA: Diagnosis not present

## 2019-05-31 MED ORDER — GABAPENTIN 300 MG PO CAPS: 300.0000 mg | ORAL_CAPSULE | Freq: Every day | ORAL | 3 refills | Status: DC

## 2019-05-31 NOTE — Patient Instructions (Signed)

## 2019-05-31 NOTE — Progress Notes (Signed)
Subjective:  Patient ID: Heidi Barrett, female    DOB: Apr 13, 1966  Age: 53 y.o. MRN: [id number]  CC: Annual Exam   HPI Heidi Barrett is a 53 year old female with a history of hypertension, menorrhagia secondary to fibroids, hypothyroidism who presents today initially scheduled for an annual physical exam but was found to have her monthly cycle and so this was changed to an office visit.  She complains of low back pain which radiates from right lower back down to her right lower extremities but previously radiated to both lower extremities.  Ibuprofen and Flexeril provide minimal relief and physical therapy has been minimally beneficial. I had referred her for an MRI of the lumbar spine which she was unable to undergo due to a cost of $1405.  She denies falls, loss of sphincteric function. She is requesting an extension of her leave from work for the next 4 weeks to enable her complete PT sessions as she has frequent bending and lifting at her work.  Past Medical History:  Diagnosis Date  . Anemia   . Hypertension   . Thyroid disease     Past Surgical History:  Procedure Laterality Date  . IR GENERIC HISTORICAL  05/28/2016   IR RADIOLOGIST EVAL & MGMT 05/28/2016 Markus Daft, MD [facility] INTERV RAD  . IR RADIOLOGIST EVAL & MGMT  03/06/2017  . IR RADIOLOGIST EVAL & MGMT  02/17/2018    Family History  Problem Relation Age of Onset  . Heart disease Mother   . Stroke Mother   . Cancer Father        prostate    Allergies  Allergen Reactions  . Hctz [Hydrochlorothiazide] Shortness Of Breath and Other (See Comments)    wheezing  . Robaxin [Methocarbamol] Swelling    Sensation of throat swelling/difficulty swallowing after taking this medication  . Tramadol Anaphylaxis and Swelling  . Hydrocodone Nausea And Vomiting    dizzines  . Percocet [Oxycodone-Acetaminophen] Nausea And Vomiting and Other (See Comments)    weakness    Outpatient Medications Prior to Visit  Medication Sig  Dispense Refill  . acetaminophen (TYLENOL) 325 MG tablet Take 650 mg by mouth every 6 (six) hours as needed for mild pain.    Marland Kitchen albuterol (PROVENTIL HFA) 108 (90 Base) MCG/ACT inhaler Inhale 2 puffs into the lungs every 4 (four) hours as needed for wheezing or shortness of breath. 6.7 g 2  . amLODipine (NORVASC) 10 MG tablet TAKE 1 TABLET BY MOUTH DAILY. FOR BLOOD PRESSURE. 30 tablet 6  . calcium carbonate (CALCIUM 600) 600 MG TABS tablet Take 1 tablet (600 mg total) by mouth daily with breakfast. 60 tablet 3  . cholecalciferol (VITAMIN D) 1000 UNITS tablet Take 1 tablet (1,000 Units total) by mouth daily. (Patient not taking: Reported on 10/07/2018) 90 tablet 3  . cyclobenzaprine (FLEXERIL) 10 MG tablet Take 1 tablet (10 mg total) by mouth 2 (two) times daily as needed for muscle spasms. 60 tablet 1  . EPINEPHrine 0.3 mg/0.3 mL IJ SOAJ injection Inject 0.3 mLs (0.3 mg total) into the muscle as needed for anaphylaxis. 1 each prn  . fluticasone (FLONASE) 50 MCG/ACT nasal spray Place 2 sprays into both nostrils daily. 16 g 12  . ibuprofen (ADVIL,MOTRIN) 600 MG tablet Take 1 tablet (600 mg total) by mouth every 8 (eight) hours as needed. 60 tablet 1  . levothyroxine (SYNTHROID) 150 MCG tablet TAKE 1 TABLET (150 MCG TOTAL) BY MOUTH DAILY BEFORE BREAKFAST. 30 tablet 2  . metoprolol tartrate (  LOPRESSOR) 25 MG tablet Take 1 tablet (25 mg total) by mouth 2 (two) times daily. 60 tablet 6  . Multiple Vitamin (MULTIVITAMIN WITH MINERALS) TABS tablet Take 1 tablet by mouth daily. 90 tablet 3   No facility-administered medications prior to visit.      ROS Review of Systems  Constitutional: Negative for activity change, appetite change and fatigue.  HENT: Negative for congestion, sinus pressure and sore throat.   Eyes: Negative for visual disturbance.  Respiratory: Negative for cough, chest tightness, shortness of breath and wheezing.   Cardiovascular: Negative for chest pain and palpitations.   Gastrointestinal: Negative for abdominal distention, abdominal pain and constipation.  Endocrine: Negative for polydipsia.  Genitourinary: Negative for dysuria and frequency.  Musculoskeletal: Positive for back pain. Negative for arthralgias.  Skin: Negative for rash.  Neurological: Negative for tremors, light-headedness and numbness.  Hematological: Does not bruise/bleed easily.  Psychiatric/Behavioral: Negative for agitation and behavioral problems.    Objective:  BP 138/90 (BP Location: Left Arm, Patient Position: Sitting, Cuff Size: Normal)   Pulse 69   Temp 98.1 F (36.7 C) (Oral)   Resp 18   Ht 5\' 8"  (1.727 m)   Wt 218 lb (98.9 kg)   LMP 05/29/2019   SpO2 100%   BMI 33.15 kg/m   BP/Weight 05/31/2019 02/25/2019 0000000  Systolic BP 0000000 [id number] [id number]  Diastolic BP 90 84 97  Wt. (Lbs) 218 216 -  BMI 33.15 32.84 -      Physical Exam Constitutional:      Appearance: She is well-developed.  Neck:     Vascular: No JVD.  Cardiovascular:     Rate and Rhythm: Normal rate.     Heart sounds: Normal heart sounds. No murmur.  Pulmonary:     Effort: Pulmonary effort is normal.     Breath sounds: Normal breath sounds. No wheezing or rales.  Chest:     Chest wall: No tenderness.  Abdominal:     General: Bowel sounds are normal. There is no distension.     Palpations: Abdomen is soft. There is no mass.     Tenderness: There is no abdominal tenderness.  Musculoskeletal: Normal range of motion.     Right lower leg: No edema.     Left lower leg: No edema.     Comments: Slight right-sided lower back pain Positive straight leg raise on the right  Neurological:     Mental Status: She is alert and oriented to person, place, and time.     Gait: Gait normal.  Psychiatric:        Mood and Affect: Mood normal.     CMP Latest Ref Rng & Units 04/29/2019 05/18/2018 12/11/2017  Glucose 65 - 99 mg/dL 76 92 76  BUN 6 - 24 mg/dL 12 15 12   Creatinine 0.57 - 1.00 mg/dL 1.01(H) 0.91 1.02(H)   Sodium 134 - 144 mmol/L 141 143 143  Potassium 3.5 - 5.2 mmol/L 3.7 3.8 3.6  Chloride 96 - 106 mmol/L 104 104 105  CO2 20 - 29 mmol/L 26 25 23   Calcium 8.7 - 10.2 mg/dL 9.2 9.2 9.2  Total Protein 6.0 - 8.5 g/dL 6.5 6.7 6.6  Total Bilirubin 0.0 - 1.2 mg/dL 0.5 0.2 0.3  Alkaline Phos 39 - 117 IU/L 53 63 48  AST 0 - 40 IU/L 14 14 12   ALT 0 - 32 IU/L 16 14 16     Lipid Panel     Component Value Date/Time   CHOL 193 04/29/2019  0916   TRIG 82 04/29/2019 0916   HDL 58 04/29/2019 0916   CHOLHDL 3.3 04/29/2019 0916   CHOLHDL 2.6 01/16/2015 1041   VLDL 9 01/16/2015 1041   LDLCALC 120 (H) 04/29/2019 0916    CBC    Component Value Date/Time   WBC 5.4 12/02/2017 1152   RBC 3.81 (L) 12/02/2017 1152   HGB 11.6 (L) 12/02/2017 1152   HGB 13.3 10/13/2017 1346   HCT 34.8 (L) 12/02/2017 1152   HCT 38.5 10/13/2017 1346   PLT 159 12/02/2017 1152   PLT 167 10/13/2017 1346   MCV 91.3 12/02/2017 1152   MCV 90 10/13/2017 1346   MCH 30.4 12/02/2017 1152   MCHC 33.3 12/02/2017 1152   RDW 12.7 12/02/2017 1152   RDW 13.6 10/13/2017 1346   LYMPHSABS 1.2 12/02/2017 1152   LYMPHSABS 1.1 05/07/2017 1120   MONOABS 0.3 12/02/2017 1152   EOSABS 0.3 12/02/2017 1152   EOSABS 0.2 05/07/2017 1120   BASOSABS 0.0 12/02/2017 1152   BASOSABS 0.0 05/07/2017 1120    Lab Results  Component Value Date   HGBA1C 4.5 05/31/2014    Assessment & Plan:   1. Chronic midline low back pain with bilateral sciatica Uncontrolled Gabapentin added to regimen Continue ibuprofen and Flexeril Letter has been provided for her place of work - Ambulatory referral to Spine Surgery - gabapentin (NEURONTIN) 300 MG capsule; Take 1 capsule (300 mg total) by mouth at bedtime.  Dispense: 30 capsule; Refill: 3    Meds ordered this encounter  Medications  . gabapentin (NEURONTIN) 300 MG capsule    Sig: Take 1 capsule (300 mg total) by mouth at bedtime.    Dispense:  30 capsule    Refill:  3    Follow-up: Return in  about 3 weeks (around 06/21/2019) for complete physical.       Charlott Rakes, MD, FAAFP. [facility] [place], [place]   05/31/2019, 10:12 AM

## 2019-06-01 ENCOUNTER — Other Ambulatory Visit: Payer: Self-pay

## 2019-06-01 ENCOUNTER — Ambulatory Visit: Payer: BLUE CROSS/BLUE SHIELD

## 2019-06-01 DIAGNOSIS — G8929 Other chronic pain: Secondary | ICD-10-CM

## 2019-06-01 DIAGNOSIS — R262 Difficulty in walking, not elsewhere classified: Secondary | ICD-10-CM

## 2019-06-01 DIAGNOSIS — M25551 Pain in right hip: Secondary | ICD-10-CM

## 2019-06-01 DIAGNOSIS — M5441 Lumbago with sciatica, right side: Secondary | ICD-10-CM

## 2019-06-01 DIAGNOSIS — M25562 Pain in left knee: Secondary | ICD-10-CM

## 2019-06-01 NOTE — Therapy (Signed)
[place], [place], [address] Phone: [phone]   Fax:  [phone]  Physical Therapy Treatment  Patient Details  Name: Heidi Barrett MRN: [id number] Date of Birth: [birth year] Referring Provider (PT): Charlott Rakes, MD   Encounter Date: 06/01/2019  PT End of Session - 06/01/19 1139    Visit Number  13    Number of Visits  18    Date for PT Re-Evaluation  06/18/19    Authorization Type  BCBS    PT Start Time  1135    PT Stop Time  1220    PT Time Calculation (min)  45 min    Activity Tolerance  Patient tolerated treatment well    Behavior During Therapy  [facility] for tasks assessed/performed       Past Medical History:  Diagnosis Date  . Anemia   . Hypertension   . Thyroid disease     Past Surgical History:  Procedure Laterality Date  . IR GENERIC HISTORICAL  05/28/2016   IR RADIOLOGIST EVAL & MGMT 05/28/2016 Markus Daft, MD [facility] INTERV RAD  . IR RADIOLOGIST EVAL & MGMT  03/06/2017  . IR RADIOLOGIST EVAL & MGMT  02/17/2018    There were no vitals filed for this visit.  Subjective Assessment - 06/01/19 1139    Subjective  Doing pretty well    Pain Score  5     Pain Location  Back    Pain Orientation  Right    Pain Descriptors / Indicators  Aching    Pain Type  Chronic pain    Pain Onset  More than a month ago    Pain Frequency  Constant                       [facility] Adult PT Treatment/Exercise - 06/01/19 0001      Lumbar Exercises: Stretches   Single Knee to Chest Stretch  Right;Left;30 seconds    Lower Trunk Rotation  5 reps;10 seconds    Pelvic Tilt  15 reps;10 seconds      Lumbar Exercises: Aerobic   Nustep  6 min L4 UE/LE       Lumbar Exercises: Standing   Other Standing Lumbar Exercises  step ups  x 15  RT/LT  8 inch step  bilateral hand hold     Other Standing Lumbar Exercises  dead lift rom 8 inch height kettle bell 10#   x      Lumbar Exercises: Supine   Clam  15 reps    Clam Limitations  with PPT, much better  control    Bent Knee Raise  15 reps    Bent Knee Raise Limitations  RT/LT with ab set.       Moist Heat Therapy   Number Minutes Moist Heat  --   during treatment session   Moist Heat Location  Lumbar Spine      Iontophoresis   Type of Iontophoresis  Dexamethasone    Location  RT SI area    Dose  1.0 CC dex    Time  wear home patch      Manual Therapy   Manual Traction  RT leg Gr 3-4pulls / tgs    Muscle Energy Technique  RT hip flexec and push with RT gluteals.                  PT Short Term Goals - 05/20/19 1651  PT SHORT TERM GOAL #1   Title  Independent with inital HEP    Status  Achieved      PT SHORT TERM GOAL #2   Title  She will report 30% decr pain in LT SI area    Baseline  60% better    Status  Achieved      PT SHORT TERM GOAL #3   Title  she will come in to PT with level pelvis    Baseline  unlevel today    Status  On-going      PT SHORT TERM GOAL #4   Title  She will report standing for 30 min wihtout incr pain    Baseline  15 min max    Status  On-going        PT Long Term Goals - 05/20/19 1654      PT LONG TERM GOAL #1   Title  Pt will be I and compliant with HEP. (Target goal for all goals 6 weeks 05/18/19/20)    Status  On-going      PT LONG TERM GOAL #2   Title  She will report less than 2-3/10 standing 60 min or more.    Baseline  6/10 is lowest pain level in past week    Status  On-going      PT LONG TERM GOAL #3   Title  She will increaese lumbar ROM to [facility].    Baseline  No change    Status  On-going      PT LONG TERM GOAL #4   Title  she will increase Rt leg strength to at least 4+/5 overall    Baseline  Grossly 4/5 at best .    Status  On-going            Plan - 06/01/19 1140    Clinical Impression Statement  Tolerated all exercise. Her ROM is excellant  with overpressure.    Continue with stretchingn and core strength. modalites, manual    PT  Treatment/Interventions  Aquatic Therapy;Cryotherapy;Electrical Stimulation;Iontophoresis 4mg /ml Dexamethasone;Moist Heat;Traction;Ultrasound;Gait training;Stair training;Therapeutic activities;Therapeutic exercise;Balance training;Neuromuscular re-education;Manual techniques;Passive range of motion;Taping;Spinal Manipulations;Joint Manipulations;Dry needling    PT Next Visit Plan  Continue to work flexion exercises.    Core stability.  manual,     ionto  lifting and carry    PT Home Exercise Plan  prone lying, piriformis stretch, slump stretch,LTR    PPT, PPT with clams, PPT with hip flexion    Consulted and Agree with Plan of Care  Patient       Patient will benefit from skilled therapeutic intervention in order to improve the following deficits and impairments:  Abnormal gait, Decreased activity tolerance, Decreased strength, Decreased range of motion, Difficulty walking, Increased muscle spasms, Obesity, Pain  Visit Diagnosis: Pain in right hip  Chronic right-sided low back pain with right-sided sciatica  Difficulty in walking, not elsewhere classified  Acute pain of left knee     Problem List Patient Active Problem List   Diagnosis Date Noted  . Palpitation 09/10/2017  . Atypical chest pain 07/12/2016  . Generalized anxiety disorder 07/04/2016  . Right lower quadrant abdominal pain 02/08/2016  . Eye muscle twitches 02/08/2016  . Fibroid, uterine 02/08/2016  . Piriformis syndrome of right side 01/31/2015  . Allergic reaction caused by a drug 01/27/2015  . Heel spur 01/16/2015  . Essential hypertension, benign 01/16/2015  . Benign essential HTN 05/31/2014  . Plantar fasciitis 05/31/2014  . Other specified hypothyroidism 05/31/2014  .  Intramural leiomyoma of uterus 05/31/2014  . Menorrhagia 01/04/2013  . Fibroids 01/04/2013    Darrel Hoover  PT 06/01/2019, 1:10 PM  [facility] [address] [place], [place],  [address] Phone: [phone]   Fax:  [phone]  Name: Heidi Barrett MRN: [id number] Date of Birth: April 07, 1966

## 2019-06-03 ENCOUNTER — Ambulatory Visit: Payer: BLUE CROSS/BLUE SHIELD

## 2019-06-08 ENCOUNTER — Ambulatory Visit: Payer: BLUE CROSS/BLUE SHIELD

## 2019-06-10 ENCOUNTER — Encounter: Payer: Self-pay | Admitting: Physical Therapy

## 2019-06-10 ENCOUNTER — Ambulatory Visit: Payer: BLUE CROSS/BLUE SHIELD | Attending: Family Medicine | Admitting: Physical Therapy

## 2019-06-10 ENCOUNTER — Other Ambulatory Visit: Payer: Self-pay

## 2019-06-10 DIAGNOSIS — M5441 Lumbago with sciatica, right side: Secondary | ICD-10-CM | POA: Insufficient documentation

## 2019-06-10 DIAGNOSIS — R262 Difficulty in walking, not elsewhere classified: Secondary | ICD-10-CM | POA: Diagnosis present

## 2019-06-10 DIAGNOSIS — M25551 Pain in right hip: Secondary | ICD-10-CM | POA: Diagnosis not present

## 2019-06-10 DIAGNOSIS — M25562 Pain in left knee: Secondary | ICD-10-CM | POA: Insufficient documentation

## 2019-06-10 DIAGNOSIS — G8929 Other chronic pain: Secondary | ICD-10-CM | POA: Insufficient documentation

## 2019-06-10 NOTE — Therapy (Signed)
[place], [place], [address] Phone: [phone]   Fax:  [phone]  Physical Therapy Treatment  Patient Details  Name: Heidi Barrett MRN: [id number] Date of Birth: 1966/05/31 Referring Provider (PT): Charlott Rakes, MD   Encounter Date: 06/10/2019  PT End of Session - 06/10/19 1242    Visit Number  14    Number of Visits  18    Date for PT Re-Evaluation  06/18/19    Authorization Type  BCBS    PT Start Time  1233    PT Stop Time  1313    PT Time Calculation (min)  40 min       Past Medical History:  Diagnosis Date  . Anemia   . Hypertension   . Thyroid disease     Past Surgical History:  Procedure Laterality Date  . IR GENERIC HISTORICAL  05/28/2016   IR RADIOLOGIST EVAL & MGMT 05/28/2016 Markus Daft, MD [facility] INTERV RAD  . IR RADIOLOGIST EVAL & MGMT  03/06/2017  . IR RADIOLOGIST EVAL & MGMT  02/17/2018    There were no vitals filed for this visit.  Subjective Assessment - 06/10/19 1238    Subjective  I am having pain in my hip and back.    Currently in Pain?  Yes    Pain Score  7     Pain Location  Hip    Pain Orientation  Right    Pain Descriptors / Indicators  Aching    Pain Type  Chronic pain    Aggravating Factors   sitting with weight on right side, cooking    Pain Relieving Factors  rest, meds, lye down                       [facility] Adult PT Treatment/Exercise - 06/10/19 0001      Lumbar Exercises: Stretches   Active Hamstring Stretch  3 reps;20 seconds    Active Hamstring Stretch Limitations  strap     ITB Stretch  3 reps    ITB Stretch Limitations  strap , also adductor stretch with strap     Piriformis Stretch  3 reps;30 seconds      Lumbar Exercises: Aerobic   Nustep  6 min L5 UE/LE       Lumbar Exercises: Machines for Strengthening   Leg Press  2 plates x 20       Lumbar Exercises: Standing   Other Standing Lumbar Exercises  step ups  x 15  RT/LT   8 inch step  single hand hold     Other Standing Lumbar Exercises  dead lift rom 8 inch height kettle bell 10#   x 10 , then squat to 8 inch step 10#       Lumbar Exercises: Supine   Pelvic Tilt  15 reps;5 seconds    Pelvic Tilt Limitations  into small bridge    Bent Knee Raise Limitations  one leg up at a time into table top, difficult but not painful x 5     Bridge Limitations                 PT Short Term Goals - 05/20/19 1651      PT SHORT TERM GOAL #1   Title  Independent with inital HEP    Status  Achieved      PT SHORT TERM GOAL #2   Title  She will report 30% decr pain in  LT SI area    Baseline  60% better    Status  Achieved      PT SHORT TERM GOAL #3   Title  she will come in to PT with level pelvis    Baseline  unlevel today    Status  On-going      PT SHORT TERM GOAL #4   Title  She will report standing for 30 min wihtout incr pain    Baseline  15 min max    Status  On-going        PT Long Term Goals - 05/20/19 1654      PT LONG TERM GOAL #1   Title  Pt will be I and compliant with HEP. (Target goal for all goals 6 weeks 05/18/19/20)    Status  On-going      PT LONG TERM GOAL #2   Title  She will report less than 2-3/10 standing 60 min or more.    Baseline  6/10 is lowest pain level in past week    Status  On-going      PT LONG TERM GOAL #3   Title  She will increaese lumbar ROM to [facility].    Baseline  No change    Status  On-going      PT LONG TERM GOAL #4   Title  she will increase Rt leg strength to at least 4+/5 overall    Baseline  Grossly 4/5 at best .    Status  On-going            Plan - 06/10/19 1251    Clinical Impression Statement  Pt reports improved flexibility since beginning PT however she has difficulty with standing for cooking. Progressed with posterior hip strengthening using dead lifts, squats and leg press. She tolerated all therex well today. Decreased core stability noted with supine core challenges.    PT Next  Visit Plan  Continue to work flexion exercises.    Core stability.  manual,     ionto  lifting and carry    PT Home Exercise Plan  prone lying, piriformis stretch, slump stretch,LTR    PPT, PPT with clams, PPT with hip flexion       Patient will benefit from skilled therapeutic intervention in order to improve the following deficits and impairments:  Abnormal gait, Decreased activity tolerance, Decreased strength, Decreased range of motion, Difficulty walking, Increased muscle spasms, Obesity, Pain  Visit Diagnosis: Pain in right hip  Chronic right-sided low back pain with right-sided sciatica  Difficulty in walking, not elsewhere classified  Acute pain of left knee     Problem List Patient Active Problem List   Diagnosis Date Noted  . Palpitation 09/10/2017  . Atypical chest pain 07/12/2016  . Generalized anxiety disorder 07/04/2016  . Right lower quadrant abdominal pain 02/08/2016  . Eye muscle twitches 02/08/2016  . Fibroid, uterine 02/08/2016  . Piriformis syndrome of right side 01/31/2015  . Allergic reaction caused by a drug 01/27/2015  . Heel spur 01/16/2015  . Essential hypertension, benign 01/16/2015  . Benign essential HTN 05/31/2014  . Plantar fasciitis 05/31/2014  . Other specified hypothyroidism 05/31/2014  . Intramural leiomyoma of uterus 05/31/2014  . Menorrhagia 01/04/2013  . Fibroids 01/04/2013    Dorene Ar, PTA 06/10/2019, 1:35 PM  [place], [place], [address] Phone: [phone]   Fax:  [phone]  Name: Keajah Miesner MRN: [id number] Date of Birth: November 03, 1965

## 2019-06-14 ENCOUNTER — Other Ambulatory Visit: Payer: Self-pay

## 2019-06-14 ENCOUNTER — Ambulatory Visit: Payer: BLUE CROSS/BLUE SHIELD

## 2019-06-14 DIAGNOSIS — M25551 Pain in right hip: Secondary | ICD-10-CM | POA: Diagnosis not present

## 2019-06-14 DIAGNOSIS — R262 Difficulty in walking, not elsewhere classified: Secondary | ICD-10-CM

## 2019-06-14 DIAGNOSIS — G8929 Other chronic pain: Secondary | ICD-10-CM

## 2019-06-14 NOTE — Therapy (Addendum)
[place], [place], [address] Phone: [phone]   Fax:  [phone]  Physical Therapy Treatment/Discharge  Patient Details  Name: Heidi Barrett MRN: [id number] Date of Birth: [birth year] Referring Provider (PT): Charlott Rakes, MD   Encounter Date: 06/14/2019  PT End of Session - 06/14/19 1713    Visit Number  15    Number of Visits  18    Date for PT Re-Evaluation  06/18/19    Authorization Type  BC[employer]    PT Start Time  0421    PT Stop Time  0510    PT Time Calculation (min)  49 min    Activity Tolerance  Patient tolerated treatment well;No increased pain    Behavior During Therapy  WFL for tasks assessed/performed       Past Medical History:  Diagnosis Date  . Anemia   . Hypertension   . Thyroid disease     Past Surgical History:  Procedure Laterality Date  . IR GENERIC HISTORICAL  05/28/2016   IR RADIOLOGIST EVAL & MGMT 05/28/2016 Markus Daft, MD [facility] INTERV RAD  . IR RADIOLOGIST EVAL & MGMT  03/06/2017  . IR RADIOLOGIST EVAL & MGMT  02/17/2018    There were no vitals filed for this visit.  Subjective Assessment - 06/14/19 1625    Subjective  I am having pain in my hip and back. 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 8-9 boxes,       Lift 50# boxs.  4 boxes of meat,     Food off warmer  20-30 pounds  6x/day,      remove trash  5 bags  not sure weight.    Pain Score  2     Pain Location  Hip    Pain Orientation  Right    Pain Descriptors / Indicators  Aching    Pain Type  Chronic pain    Pain Onset  More than a month ago    Pain Frequency  Constant                       [facility] Adult PT Treatment/Exercise - 06/14/19 0001      Therapeutic Activites    Therapeutic Activities  Lifting;Work Web designer  floor to waist  then waist to head height  max 15 pounds.                Carry 20#       Lumbar Exercises: Stretches   Passive  Hamstring Stretch  Right;Left;60 seconds    Piriformis Stretch  Right;2 reps;30 seconds      Lumbar Exercises: Aerobic   Nustep  6 min L5 UE/LE       Lumbar Exercises: Machines for Strengthening   Leg Press  2 plates x 20       Lumbar Exercises: Standing   Other Standing Lumbar Exercises  step ups  x 15  RT/LT  8 inch step  single hand hold     Other Standing Lumbar Exercises  dead lift rom 8 inch height kettle bell 15#   x 10 , then squat to 8 inch step 10#       Lumbar Exercises: Supine   Pelvic Tilt  15 reps;5 seconds    Pelvic Tilt Limitations  into small bridge       extended time spent on mechanics education  PT Education - 06/14/19 1712    Education Details  improtancde of keepin the load close when lifitng an d carrying.    Person(s) Educated  Patient    Methods  Explanation    Comprehension  Verbalized understanding       PT Short Term Goals - 05/20/19 1651      PT SHORT TERM GOAL #1   Title  Independent with inital HEP    Status  Achieved      PT SHORT TERM GOAL #2   Title  She will report 30% decr pain in LT SI area    Baseline  60% better    Status  Achieved      PT SHORT TERM GOAL #3   Title  she will come in to PT with level pelvis    Baseline  unlevel today    Status  On-going      PT SHORT TERM GOAL #4   Title  She will report standing for 30 min wihtout incr pain    Baseline  15 min max    Status  On-going        PT Long Term Goals - 05/20/19 1654      PT LONG TERM GOAL #1   Title  Pt will be I and compliant with HEP. (Target goal for all goals 6 weeks 05/18/19/20)    Status  On-going      PT LONG TERM GOAL #2   Title  She will report less than 2-3/10 standing 60 min or more.    Baseline  6/10 is lowest pain level in past week    Status  On-going      PT LONG TERM GOAL #3   Title  She will increaese lumbar ROM to [facility].    Baseline  No change    Status  On-going      PT LONG TERM GOAL #4   Title  she will increase Rt leg  strength to at least 4+/5 overall    Baseline  Grossly 4/5 at best .    Status  On-going            Plan - 06/14/19 1714    Clinical Impression Statement  Over all no increased pain.   She is extremely limited with lifting and carrying. Max lifting safe is 10 pounds and carry is 5 pounds.  we took time to review body mechanics . She is able to walk and carry without [facility] but she still relies on this for most of her ambulation which may automatically diqualify her for return to her job that requoies 50# lifting  multiple time a day.    PT Next Visit Plan  Continue to work flexion exercises.    Core stability.  manual,      lifting ( 10#)   and carry (15#)    PT Home Exercise Plan  prone lying, piriformis stretch, slump stretch,LTR    PPT, PPT with clams, PPT with hip flexion       Patient will benefit from skilled therapeutic intervention in order to improve the following deficits and impairments:     Visit Diagnosis: Pain in right hip  Chronic right-sided low back pain with right-sided sciatica  Difficulty in walking, not elsewhere classified     Problem List Patient Active Problem List   Diagnosis Date Noted  . Palpitation 09/10/2017  . Atypical chest pain 07/12/2016  . Generalized anxiety disorder 07/04/2016  . Right lower quadrant abdominal pain  02/08/2016  . Eye muscle twitches 02/08/2016  . Fibroid, uterine 02/08/2016  . Piriformis syndrome of right side 01/31/2015  . Allergic reaction caused by a drug 01/27/2015  . Heel spur 01/16/2015  . Essential hypertension, benign 01/16/2015  . Benign essential HTN 05/31/2014  . Plantar fasciitis 05/31/2014  . Other specified hypothyroidism 05/31/2014  . Intramural leiomyoma of uterus 05/31/2014  . Menorrhagia 01/04/2013  . Fibroids 01/04/2013    Darrel Hoover PT 06/14/2019, 5:19 PM  [facility] [address] [place], [place], [address] Phone: [phone]   Fax:   [phone]  Name: Heidi Barrett MRN: [id number] Date of Birth: 1965-10-12  PHYSICAL THERAPY DISCHARGE SUMMARY  Visits from Start of Care: 15  Current functional level related to goals / functional outcomes: See above     She canceled her last 2 appointments due to insurance not covering visits.  She can return if she and you feel it is needed and we would be glad to resume PT for a period working on return to work and decr pain.    Remaining deficits: See above   Education / Equipment: HEP  Plan: Patient agrees to discharge.  Patient goals were not met. Patient is being discharged due to financial reasons.  ?????    Pearson Forster PT   07/09/19

## 2019-06-16 ENCOUNTER — Telehealth: Payer: Self-pay

## 2019-06-16 NOTE — Telephone Encounter (Signed)
The pre cert from [name] called requesting a prior auth for the patient. Patient has Heidi Barrett. Please f/u

## 2019-06-16 NOTE — Telephone Encounter (Signed)
Patient is needing a peer to peer called into her insurance company to get MRI authorized.  [phone]

## 2019-06-17 ENCOUNTER — Ambulatory Visit: Payer: BLUE CROSS/BLUE SHIELD

## 2019-06-17 NOTE — Telephone Encounter (Signed)
Unfortunately I will be unable to call her insurance company for a peer to peer if they do not want to cover the MRI.  I had referred her to spine surgery who will take over management of her back pain and I will trust their judgment to order the indicated imaging.

## 2019-06-20 ENCOUNTER — Ambulatory Visit (HOSPITAL_COMMUNITY): Payer: BLUE CROSS/BLUE SHIELD

## 2019-06-22 ENCOUNTER — Ambulatory Visit: Payer: BLUE CROSS/BLUE SHIELD

## 2019-06-23 ENCOUNTER — Ambulatory Visit: Payer: BLUE CROSS/BLUE SHIELD

## 2019-06-28 ENCOUNTER — Ambulatory Visit: Payer: BLUE CROSS/BLUE SHIELD | Attending: Family Medicine | Admitting: Family Medicine

## 2019-06-28 ENCOUNTER — Encounter: Payer: Self-pay | Admitting: Family Medicine

## 2019-06-28 ENCOUNTER — Other Ambulatory Visit: Payer: Self-pay

## 2019-06-28 VITALS — BP 138/89 | HR 78 | Temp 98.0°F | Ht 68.0 in | Wt 219.8 lb

## 2019-06-28 DIAGNOSIS — Z1211 Encounter for screening for malignant neoplasm of colon: Secondary | ICD-10-CM

## 2019-06-28 DIAGNOSIS — B3731 Acute candidiasis of vulva and vagina: Secondary | ICD-10-CM

## 2019-06-28 DIAGNOSIS — E038 Other specified hypothyroidism: Secondary | ICD-10-CM

## 2019-06-28 DIAGNOSIS — Z1231 Encounter for screening mammogram for malignant neoplasm of breast: Secondary | ICD-10-CM

## 2019-06-28 DIAGNOSIS — I10 Essential (primary) hypertension: Secondary | ICD-10-CM | POA: Diagnosis not present

## 2019-06-28 DIAGNOSIS — B373 Candidiasis of vulva and vagina: Secondary | ICD-10-CM | POA: Diagnosis not present

## 2019-06-28 DIAGNOSIS — Z0001 Encounter for general adult medical examination with abnormal findings: Secondary | ICD-10-CM | POA: Diagnosis not present

## 2019-06-28 DIAGNOSIS — G8929 Other chronic pain: Secondary | ICD-10-CM

## 2019-06-28 DIAGNOSIS — Z0000 Encounter for general adult medical examination without abnormal findings: Secondary | ICD-10-CM

## 2019-06-28 DIAGNOSIS — Z124 Encounter for screening for malignant neoplasm of cervix: Secondary | ICD-10-CM

## 2019-06-28 MED ORDER — LEVOTHYROXINE SODIUM 150 MCG PO TABS: 150.0000 ug | ORAL_TABLET | Freq: Every day | ORAL | 1 refills | Status: DC

## 2019-06-28 MED ORDER — CYCLOBENZAPRINE HCL 10 MG PO TABS: 10.0000 mg | ORAL_TABLET | Freq: Two times a day (BID) | ORAL | 1 refills | Status: DC | PRN

## 2019-06-28 MED ORDER — FLUCONAZOLE 150 MG PO TABS: 150.0000 mg | ORAL_TABLET | Freq: Once | ORAL | 0 refills | Status: AC

## 2019-06-28 MED ORDER — AMLODIPINE BESYLATE 10 MG PO TABS: ORAL_TABLET | ORAL | 1 refills | Status: DC

## 2019-06-28 MED ORDER — GABAPENTIN 300 MG PO CAPS: 300.0000 mg | ORAL_CAPSULE | Freq: Every day | ORAL | 1 refills | Status: DC

## 2019-06-28 MED ORDER — METOPROLOL TARTRATE 25 MG PO TABS: 25.0000 mg | ORAL_TABLET | Freq: Two times a day (BID) | ORAL | 1 refills | Status: DC

## 2019-06-28 NOTE — Patient Instructions (Signed)
Health Maintenance, Female Adopting a healthy lifestyle and getting preventive care are important in promoting health and wellness. Ask your health care provider about:  The right schedule for you to have regular tests and exams.  Things you can do on your own to prevent diseases and keep yourself healthy. What should I know about diet, weight, and exercise? Eat a healthy diet   Eat a diet that includes plenty of vegetables, fruits, low-fat dairy products, and lean protein.  Do not eat a lot of foods that are high in solid fats, added sugars, or sodium. Maintain a healthy weight Body mass index (BMI) is used to identify weight problems. It estimates body fat based on height and weight. Your health care provider can help determine your BMI and help you achieve or maintain a healthy weight. Get regular exercise Get regular exercise. This is one of the most important things you can do for your health. Most adults should:  Exercise for at least 150 minutes each week. The exercise should increase your heart rate and make you sweat (moderate-intensity exercise).  Do strengthening exercises at least twice a week. This is in addition to the moderate-intensity exercise.  Spend less time sitting. Even light physical activity can be beneficial. Watch cholesterol and blood lipids Have your blood tested for lipids and cholesterol at 51 years of age, then have this test every 5 years. Have your cholesterol levels checked more often if:  Your lipid or cholesterol levels are high.  You are older than 51 years of age.  You are at high risk for heart disease. What should I know about cancer screening? Depending on your health history and family history, you may need to have cancer screening at various ages. This may include screening for:  Breast cancer.  Cervical cancer.  Colorectal cancer.  Skin cancer.  Lung cancer. What should I know about heart disease, diabetes, and high blood  pressure? Blood pressure and heart disease  High blood pressure causes heart disease and increases the risk of stroke. This is more likely to develop in people who have high blood pressure readings, are of African descent, or are overweight.  Have your blood pressure checked: ? Every 3-5 years if you are 18-39 years of age. ? Every year if you are 40 years old or older. Diabetes Have regular diabetes screenings. This checks your fasting blood sugar level. Have the screening done:  Once every three years after age 40 if you are at a normal weight and have a low risk for diabetes.  More often and at a younger age if you are overweight or have a high risk for diabetes. What should I know about preventing infection? Hepatitis B If you have a higher risk for hepatitis B, you should be screened for this virus. Talk with your health care provider to find out if you are at risk for hepatitis B infection. Hepatitis C Testing is recommended for:  Everyone born from 1945 through 1965.  Anyone with known risk factors for hepatitis C. Sexually transmitted infections (STIs)  Get screened for STIs, including gonorrhea and chlamydia, if: ? You are sexually active and are younger than 51 years of age. ? You are older than 51 years of age and your health care provider tells you that you are at risk for this type of infection. ? Your sexual activity has changed since you were last screened, and you are at increased risk for chlamydia or gonorrhea. Ask your health care provider if   you are at risk.  Ask your health care provider about whether you are at high risk for HIV. Your health care provider may recommend a prescription medicine to help prevent HIV infection. If you choose to take medicine to prevent HIV, you should first get tested for HIV. You should then be tested every 3 months for as long as you are taking the medicine. Pregnancy  If you are about to stop having your period (premenopausal) and  you may become pregnant, seek counseling before you get pregnant.  Take 400 to 800 micrograms (mcg) of folic acid every day if you become pregnant.  Ask for birth control (contraception) if you want to prevent pregnancy. Osteoporosis and menopause Osteoporosis is a disease in which the bones lose minerals and strength with aging. This can result in bone fractures. If you are 65 years old or older, or if you are at risk for osteoporosis and fractures, ask your health care provider if you should:  Be screened for bone loss.  Take a calcium or vitamin D supplement to lower your risk of fractures.  Be given hormone replacement therapy (HRT) to treat symptoms of menopause. Follow these instructions at home: Lifestyle  Do not use any products that contain nicotine or tobacco, such as cigarettes, e-cigarettes, and chewing tobacco. If you need help quitting, ask your health care provider.  Do not use street drugs.  Do not share needles.  Ask your health care provider for help if you need support or information about quitting drugs. Alcohol use  Do not drink alcohol if: ? Your health care provider tells you not to drink. ? You are pregnant, may be pregnant, or are planning to become pregnant.  If you drink alcohol: ? Limit how much you use to 0-1 drink a day. ? Limit intake if you are breastfeeding.  Be aware of how much alcohol is in your drink. In the [place] one drink equals one 12 oz bottle of beer (355 mL), one 5 oz glass of wine (148 mL), or one 1 oz glass of hard liquor (44 mL). General instructions  Schedule regular health, dental, and eye exams.  Stay current with your vaccines.  Tell your health care provider if: ? You often feel depressed. ? You have ever been abused or do not feel safe at home. Summary  Adopting a healthy lifestyle and getting preventive care are important in promoting health and wellness.  Follow your health care provider's instructions about healthy  diet, exercising, and getting tested or screened for diseases.  Follow your health care provider's instructions on monitoring your cholesterol and blood pressure. This information is not intended to replace advice given to you by your health care provider. Make sure you discuss any questions you have with your health care provider. Document Released: 02/04/2011 Document Revised: 07/15/2018 Document Reviewed: 07/15/2018 Elsevier Patient Education  2020 Elsevier Inc.  

## 2019-06-28 NOTE — Progress Notes (Signed)
Subjective:  Patient ID: Heidi Barrett, female    DOB: [birth year]  Age: 53 y.o. MRN: [id number]  CC: Annual Exam and Gynecologic Exam   HPI Heidi Barrett presents for complete physical exam She has never had a colonoscopy Last Pap smear was in 05/2016 We have no record of a mammogram on file for her.  Past Medical History:  Diagnosis Date  . Anemia   . Hypertension   . Thyroid disease     Past Surgical History:  Procedure Laterality Date  . IR GENERIC HISTORICAL  05/28/2016   IR RADIOLOGIST EVAL & MGMT 05/28/2016 Markus Daft, MD [facility] INTERV RAD  . IR RADIOLOGIST EVAL & MGMT  03/06/2017  . IR RADIOLOGIST EVAL & MGMT  02/17/2018    Family History  Problem Relation Age of Onset  . Heart disease Mother   . Stroke Mother   . Cancer Father        prostate    Allergies  Allergen Reactions  . Hctz [Hydrochlorothiazide] Shortness Of Breath and Other (See Comments)    wheezing  . Robaxin [Methocarbamol] Swelling    Sensation of throat swelling/difficulty swallowing after taking this medication  . Tramadol Anaphylaxis and Swelling  . Hydrocodone Nausea And Vomiting    dizzines  . Percocet [Oxycodone-Acetaminophen] Nausea And Vomiting and Other (See Comments)    weakness    Outpatient Medications Prior to Visit  Medication Sig Dispense Refill  . acetaminophen (TYLENOL) 325 MG tablet Take 650 mg by mouth every 6 (six) hours as needed for mild pain.    Marland Kitchen albuterol (PROVENTIL HFA) 108 (90 Base) MCG/ACT inhaler Inhale 2 puffs into the lungs every 4 (four) hours as needed for wheezing or shortness of breath. 6.7 g 2  . calcium carbonate (CALCIUM 600) 600 MG TABS tablet Take 1 tablet (600 mg total) by mouth daily with breakfast. 60 tablet 3  . EPINEPHrine 0.3 mg/0.3 mL IJ SOAJ injection Inject 0.3 mLs (0.3 mg total) into the muscle as needed for anaphylaxis. 1 each prn  . fluticasone (FLONASE) 50 MCG/ACT nasal spray Place 2 sprays into both nostrils daily. 16 g 12  .  ibuprofen (ADVIL,MOTRIN) 600 MG tablet Take 1 tablet (600 mg total) by mouth every 8 (eight) hours as needed. 60 tablet 1  . Multiple Vitamin (MULTIVITAMIN WITH MINERALS) TABS tablet Take 1 tablet by mouth daily. 90 tablet 3  . amLODipine (NORVASC) 10 MG tablet TAKE 1 TABLET BY MOUTH DAILY. FOR BLOOD PRESSURE. 30 tablet 6  . cyclobenzaprine (FLEXERIL) 10 MG tablet Take 1 tablet (10 mg total) by mouth 2 (two) times daily as needed for muscle spasms. 60 tablet 1  . gabapentin (NEURONTIN) 300 MG capsule Take 1 capsule (300 mg total) by mouth at bedtime. 30 capsule 3  . levothyroxine (SYNTHROID) 150 MCG tablet TAKE 1 TABLET (150 MCG TOTAL) BY MOUTH DAILY BEFORE BREAKFAST. 30 tablet 2  . metoprolol tartrate (LOPRESSOR) 25 MG tablet Take 1 tablet (25 mg total) by mouth 2 (two) times daily. 60 tablet 6  . cholecalciferol (VITAMIN D) 1000 UNITS tablet Take 1 tablet (1,000 Units total) by mouth daily. (Patient not taking: Reported on 10/07/2018) 90 tablet 3   No facility-administered medications prior to visit.      ROS Review of Systems  Constitutional: Negative for activity change, appetite change and fatigue.  HENT: Negative for congestion, sinus pressure and sore throat.   Eyes: Negative for visual disturbance.  Respiratory: Negative for cough, chest tightness, shortness of breath and  wheezing.   Cardiovascular: Negative for chest pain and palpitations.  Gastrointestinal: Positive for abdominal pain. Negative for abdominal distention and constipation.  Endocrine: Negative for polydipsia.  Genitourinary: Negative for dysuria and frequency.  Musculoskeletal: Positive for back pain. Negative for arthralgias.  Skin: Negative for rash.  Neurological: Negative for tremors, light-headedness and numbness.  Hematological: Does not bruise/bleed easily.  Psychiatric/Behavioral: Negative for agitation and behavioral problems.    Objective:  BP 138/89   Pulse 78   Temp 98 F (36.7 C) (Oral)   Ht 5\' 8"   (1.727 m)   Wt 219 lb 12.8 oz (99.7 kg)   LMP 05/29/2019   SpO2 100%   BMI 33.42 kg/m   BP/Weight 06/28/2019 05/31/2019 [id number]  Systolic BP 0000000 0000000 [id number]  Diastolic BP 89 90 84  Wt. (Lbs) 219.8 218 216  BMI 33.42 33.15 32.84      Physical Exam Constitutional:      General: She is not in acute distress.    Appearance: She is well-developed. She is not diaphoretic.  HENT:     Head: Normocephalic.     Right Ear: External ear normal.     Left Ear: External ear normal.     Nose: Nose normal.  Eyes:     Conjunctiva/sclera: Conjunctivae normal.     Pupils: Pupils are equal, round, and reactive to light.  Neck:     Musculoskeletal: Normal range of motion.     Vascular: No JVD.  Cardiovascular:     Rate and Rhythm: Normal rate and regular rhythm.     Heart sounds: Normal heart sounds. No murmur. No gallop.   Pulmonary:     Effort: Pulmonary effort is normal. No respiratory distress.     Breath sounds: Normal breath sounds. No wheezing or rales.  Chest:     Chest wall: No tenderness.     Breasts:        Right: No mass or tenderness.        Left: No mass or tenderness.  Abdominal:     General: Bowel sounds are normal. There is no distension.     Palpations: Abdomen is soft. There is no mass.     Tenderness: There is abdominal tenderness (RLQ).  Genitourinary:    Comments: Normal appearance of external genitalia Thick cheesy vaginal discharge Normal cervix Slight cervical motion tenderness on the right Musculoskeletal: Normal range of motion.        General: No tenderness.  Skin:    General: Skin is warm and dry.  Neurological:     Mental Status: She is alert and oriented to person, place, and time.     Deep Tendon Reflexes: Reflexes are normal and symmetric.     CMP Latest Ref Rng & Units 04/29/2019 05/18/2018 12/11/2017  Glucose 65 - 99 mg/dL 76 92 76  BUN 6 - 24 mg/dL 12 15 12   Creatinine 0.57 - 1.00 mg/dL 1.01(H) 0.91 1.02(H)  Sodium 134 - 144 mmol/L 141 143 143   Potassium 3.5 - 5.2 mmol/L 3.7 3.8 3.6  Chloride 96 - 106 mmol/L 104 104 105  CO2 20 - 29 mmol/L 26 25 23   Calcium 8.7 - 10.2 mg/dL 9.2 9.2 9.2  Total Protein 6.0 - 8.5 g/dL 6.5 6.7 6.6  Total Bilirubin 0.0 - 1.2 mg/dL 0.5 0.2 0.3  Alkaline Phos 39 - 117 IU/L 53 63 48  AST 0 - 40 IU/L 14 14 12   ALT 0 - 32 IU/L 16 14 16  Lipid Panel     Component Value Date/Time   CHOL 193 04/29/2019 0916   TRIG 82 04/29/2019 0916   HDL 58 04/29/2019 0916   CHOLHDL 3.3 04/29/2019 0916   CHOLHDL 2.6 01/16/2015 1041   VLDL 9 01/16/2015 1041   LDLCALC 120 (H) 04/29/2019 0916    CBC    Component Value Date/Time   WBC 5.4 12/02/2017 1152   RBC 3.81 (L) 12/02/2017 1152   HGB 11.6 (L) 12/02/2017 1152   HGB 13.3 10/13/2017 1346   HCT 34.8 (L) 12/02/2017 1152   HCT 38.5 10/13/2017 1346   PLT 159 12/02/2017 1152   PLT 167 10/13/2017 1346   MCV 91.3 12/02/2017 1152   MCV 90 10/13/2017 1346   MCH 30.4 12/02/2017 1152   MCHC 33.3 12/02/2017 1152   RDW 12.7 12/02/2017 1152   RDW 13.6 10/13/2017 1346   LYMPHSABS 1.2 12/02/2017 1152   LYMPHSABS 1.1 05/07/2017 1120   MONOABS 0.3 12/02/2017 1152   EOSABS 0.3 12/02/2017 1152   EOSABS 0.2 05/07/2017 1120   BASOSABS 0.0 12/02/2017 1152   BASOSABS 0.0 05/07/2017 1120    Lab Results  Component Value Date   HGBA1C 4.5 05/31/2014    Assessment & Plan:   1. Annual physical exam Counseled on 150 minutes of exercise per week, healthy eating (including decreased daily intake of saturated fats, cholesterol, added sugars, sodium), STI prevention, routine healthcare maintenance.  2. Screening for colon cancer - Ambulatory referral to Gastroenterology  3. Screening for cervical cancer - Cytology - PAP([place]) - Cervicovaginal ancillary only  4. Vaginal candidiasis Right lower quadrant pain could be secondary to fibroids which she has - fluconazole (DIFLUCAN) 150 MG tablet; Take 1 tablet (150 mg total) by mouth once for 1 dose.  Dispense: 1  tablet; Refill: 0  5. Essential hypertension, benign - amLODipine (NORVASC) 10 MG tablet; TAKE 1 TABLET BY MOUTH DAILY. FOR BLOOD PRESSURE.  Dispense: 90 tablet; Refill: 1 - metoprolol tartrate (LOPRESSOR) 25 MG tablet; Take 1 tablet (25 mg total) by mouth 2 (two) times daily.  Dispense: 180 tablet; Refill: 1  6. Chronic midline low back pain with bilateral sciatica - cyclobenzaprine (FLEXERIL) 10 MG tablet; Take 1 tablet (10 mg total) by mouth 2 (two) times daily as needed for muscle spasms.  Dispense: 90 tablet; Refill: 1 - gabapentin (NEURONTIN) 300 MG capsule; Take 1 capsule (300 mg total) by mouth at bedtime.  Dispense: 90 capsule; Refill: 1  7. Other specified hypothyroidism - levothyroxine (SYNTHROID) 150 MCG tablet; Take 1 tablet (150 mcg total) by mouth daily before breakfast.  Dispense: 90 tablet; Refill: 1  8. Encounter for screening mammogram for malignant neoplasm of breast - MM Digital Screening; Future    Meds ordered this encounter  Medications  . fluconazole (DIFLUCAN) 150 MG tablet    Sig: Take 1 tablet (150 mg total) by mouth once for 1 dose.    Dispense:  1 tablet    Refill:  0  . amLODipine (NORVASC) 10 MG tablet    Sig: TAKE 1 TABLET BY MOUTH DAILY. FOR BLOOD PRESSURE.    Dispense:  90 tablet    Refill:  1  . cyclobenzaprine (FLEXERIL) 10 MG tablet    Sig: Take 1 tablet (10 mg total) by mouth 2 (two) times daily as needed for muscle spasms.    Dispense:  90 tablet    Refill:  1  . gabapentin (NEURONTIN) 300 MG capsule    Sig: Take 1 capsule (300  mg total) by mouth at bedtime.    Dispense:  90 capsule    Refill:  1  . levothyroxine (SYNTHROID) 150 MCG tablet    Sig: Take 1 tablet (150 mcg total) by mouth daily before breakfast.    Dispense:  90 tablet    Refill:  1  . metoprolol tartrate (LOPRESSOR) 25 MG tablet    Sig: Take 1 tablet (25 mg total) by mouth 2 (two) times daily.    Dispense:  180 tablet    Refill:  1    Follow-up: No follow-ups on  file.       Charlott Rakes, MD, FAAFP. [facility] [place], [place]   06/28/2019, 10:48 AM

## 2019-06-28 NOTE — Progress Notes (Signed)
Patient is requesting a 90 day supply of medications.  She does not want to use onsite pharmacy.

## 2019-06-29 LAB — CERVICOVAGINAL ANCILLARY ONLY
Bacterial Vaginitis (gardnerella): NEGATIVE
Candida Glabrata: NEGATIVE
Candida Vaginitis: NEGATIVE
Chlamydia: NEGATIVE
Comment: NEGATIVE
Comment: NEGATIVE
Comment: NEGATIVE
Comment: NEGATIVE
Comment: NEGATIVE
Comment: NORMAL
Neisseria Gonorrhea: NEGATIVE
Trichomonas: NEGATIVE

## 2019-06-29 LAB — CYTOLOGY - PAP
Comment: NEGATIVE
Diagnosis: NEGATIVE
High risk HPV: NEGATIVE

## 2019-06-30 ENCOUNTER — Telehealth: Payer: Self-pay | Admitting: Family Medicine

## 2019-06-30 NOTE — Telephone Encounter (Signed)
Patient called stating she was referred to Gastroenterology and [facility] does not accept [facility]. Patient needs to be referred out to [facility] please f/u.   Patient is also requesting a letter form her PCP to extend her leave time from work for about 1 month. Patient would like the letter for her job because she wants all her procedures done before she returns to work. Please advise.

## 2019-06-30 NOTE — Telephone Encounter (Signed)
Will route to PCP for review. 

## 2019-06-30 NOTE — Telephone Encounter (Signed)
Done

## 2019-06-30 NOTE — Telephone Encounter (Signed)
Patient was called and informed of letter being ready for pick up. 

## 2019-07-07 NOTE — Telephone Encounter (Signed)
Referral sent to   [facility] [address] [place], [place] [address] [phone] Fax [phone]

## 2019-08-09 ENCOUNTER — Telehealth: Payer: Self-pay | Admitting: Family Medicine

## 2019-08-09 NOTE — Telephone Encounter (Signed)
Will route to PCP for review. 

## 2019-08-09 NOTE — Telephone Encounter (Signed)
Patient wants a note to be written out of work until she see the [facility] on Monday

## 2019-08-09 NOTE — Telephone Encounter (Signed)
Letter has been written; any subsequent letters will need to come from her Orthopedics.

## 2019-08-16 ENCOUNTER — Telehealth: Payer: Self-pay

## 2019-08-16 NOTE — Telephone Encounter (Signed)
Patient is needing another letter to cover her for her job until she can get another appointment with spine doctor.  Patient states that she was informed by doctor that she should wait until she gets her insurance information.  Patient states that she does not have an exact time of when new insurance information will arrive.

## 2019-08-18 NOTE — Telephone Encounter (Signed)
I had informed her that subsequent letters for work would need to come from orthopedics whom she said she would be seeing this past Monday

## 2019-08-20 NOTE — Telephone Encounter (Signed)
Patient was called and a voicemail was left informing patient to obtain letter from orthopedics.

## 2019-09-13 ENCOUNTER — Other Ambulatory Visit: Payer: Self-pay | Admitting: Family Medicine

## 2019-09-13 DIAGNOSIS — I10 Essential (primary) hypertension: Secondary | ICD-10-CM

## 2019-09-14 ENCOUNTER — Other Ambulatory Visit: Payer: Self-pay | Admitting: Family Medicine

## 2019-09-14 DIAGNOSIS — I10 Essential (primary) hypertension: Secondary | ICD-10-CM

## 2019-09-22 ENCOUNTER — Telehealth: Payer: Self-pay

## 2019-09-22 NOTE — Telephone Encounter (Signed)
Patient was called to get more information on the need for the referral a voicemail was left informing patient to return phone call.

## 2019-09-22 NOTE — Telephone Encounter (Signed)
Patient needs a referral for Neuro. Insurance doesn't require referrals but the specialist office does. Patient says she forgot the name of the office she has in mind but they are located on [address] in [place]. Please call to advise patient

## 2019-12-10 ENCOUNTER — Other Ambulatory Visit: Payer: Self-pay | Admitting: Family Medicine

## 2019-12-10 DIAGNOSIS — E038 Other specified hypothyroidism: Secondary | ICD-10-CM

## 2019-12-10 DIAGNOSIS — I10 Essential (primary) hypertension: Secondary | ICD-10-CM

## 2020-01-06 ENCOUNTER — Other Ambulatory Visit: Payer: Self-pay | Admitting: Family Medicine

## 2020-01-06 DIAGNOSIS — I10 Essential (primary) hypertension: Secondary | ICD-10-CM

## 2020-01-06 DIAGNOSIS — E038 Other specified hypothyroidism: Secondary | ICD-10-CM

## 2020-01-13 ENCOUNTER — Ambulatory Visit: Payer: 59 | Attending: Family Medicine | Admitting: Family Medicine

## 2020-01-13 ENCOUNTER — Encounter: Payer: Self-pay | Admitting: Family Medicine

## 2020-01-13 ENCOUNTER — Other Ambulatory Visit: Payer: Self-pay

## 2020-01-13 VITALS — BP 142/87 | HR 76 | Ht 68.0 in | Wt 213.0 lb

## 2020-01-13 DIAGNOSIS — G8929 Other chronic pain: Secondary | ICD-10-CM

## 2020-01-13 DIAGNOSIS — I10 Essential (primary) hypertension: Secondary | ICD-10-CM | POA: Diagnosis not present

## 2020-01-13 DIAGNOSIS — Z1159 Encounter for screening for other viral diseases: Secondary | ICD-10-CM

## 2020-01-13 DIAGNOSIS — E669 Obesity, unspecified: Secondary | ICD-10-CM

## 2020-01-13 DIAGNOSIS — M5441 Lumbago with sciatica, right side: Secondary | ICD-10-CM

## 2020-01-13 DIAGNOSIS — E038 Other specified hypothyroidism: Secondary | ICD-10-CM | POA: Diagnosis not present

## 2020-01-13 DIAGNOSIS — M5442 Lumbago with sciatica, left side: Secondary | ICD-10-CM

## 2020-01-13 DIAGNOSIS — Z1211 Encounter for screening for malignant neoplasm of colon: Secondary | ICD-10-CM

## 2020-01-13 MED ORDER — METOPROLOL TARTRATE 25 MG PO TABS: 25.0000 mg | ORAL_TABLET | Freq: Two times a day (BID) | ORAL | 1 refills | Status: DC

## 2020-01-13 MED ORDER — AMLODIPINE BESYLATE 10 MG PO TABS: 10.0000 mg | ORAL_TABLET | Freq: Every day | ORAL | 1 refills | Status: DC

## 2020-01-13 NOTE — Patient Instructions (Signed)
DASH Eating Plan DASH stands for "Dietary Approaches to Stop Hypertension." The DASH eating plan is a healthy eating plan that has been shown to reduce high blood pressure (hypertension). It may also reduce your risk for type 2 diabetes, heart disease, and stroke. The DASH eating plan may also help with weight loss. What are tips for following this plan?  General guidelines  Avoid eating more than 2,300 mg (milligrams) of salt (sodium) a day. If you have hypertension, you may need to reduce your sodium intake to 1,500 mg a day.  Limit alcohol intake to no more than 1 drink a day for nonpregnant women and 2 drinks a day for men. One drink equals 12 oz of beer, 5 oz of wine, or 1 oz of hard liquor.  Work with your health care provider to maintain a healthy body weight or to lose weight. Ask what an ideal weight is for you.  Get at least 30 minutes of exercise that causes your heart to beat faster (aerobic exercise) most days of the week. Activities may include walking, swimming, or biking.  Work with your health care provider or diet and nutrition specialist (dietitian) to adjust your eating plan to your individual calorie needs. Reading food labels   Check food labels for the amount of sodium per serving. Choose foods with less than 5 percent of the Daily Value of sodium. Generally, foods with less than 300 mg of sodium per serving fit into this eating plan.  To find whole grains, look for the word "whole" as the first word in the ingredient list. Shopping  Buy products labeled as "low-sodium" or "no salt added."  Buy fresh foods. Avoid canned foods and premade or frozen meals. Cooking  Avoid adding salt when cooking. Use salt-free seasonings or herbs instead of table salt or sea salt. Check with your health care provider or pharmacist before using salt substitutes.  Do not fry foods. Cook foods using healthy methods such as baking, boiling, grilling, and broiling instead.  Cook with  heart-healthy oils, such as olive, canola, soybean, or sunflower oil. Meal planning  Eat a balanced diet that includes: ? 5 or more servings of fruits and vegetables each day. At each meal, try to fill half of your plate with fruits and vegetables. ? Up to 6-8 servings of whole grains each day. ? Less than 6 oz of lean meat, poultry, or fish each day. A 3-oz serving of meat is about the same size as a deck of cards. One egg equals 1 oz. ? 2 servings of low-fat dairy each day. ? A serving of nuts, seeds, or beans 5 times each week. ? Heart-healthy fats. Healthy fats called Omega-3 fatty acids are found in foods such as flaxseeds and coldwater fish, like sardines, salmon, and mackerel.  Limit how much you eat of the following: ? Canned or prepackaged foods. ? Food that is high in trans fat, such as fried foods. ? Food that is high in saturated fat, such as fatty meat. ? Sweets, desserts, sugary drinks, and other foods with added sugar. ? Full-fat dairy products.  Do not salt foods before eating.  Try to eat at least 2 vegetarian meals each week.  Eat more home-cooked food and less restaurant, buffet, and fast food.  When eating at a restaurant, ask that your food be prepared with less salt or no salt, if possible. What foods are recommended? The items listed may not be a complete list. Talk with your dietitian about   what dietary choices are best for you. Grains Whole-grain or whole-wheat bread. Whole-grain or whole-wheat pasta. Brown rice. Oatmeal. Quinoa. Bulgur. Whole-grain and low-sodium cereals. Pita bread. Low-fat, low-sodium crackers. Whole-wheat flour tortillas. Vegetables Fresh or frozen vegetables (raw, steamed, roasted, or grilled). Low-sodium or reduced-sodium tomato and vegetable juice. Low-sodium or reduced-sodium tomato sauce and tomato paste. Low-sodium or reduced-sodium canned vegetables. Fruits All fresh, dried, or frozen fruit. Canned fruit in natural juice (without  added sugar). Meat and other protein foods Skinless chicken or turkey. Ground chicken or turkey. Pork with fat trimmed off. Fish and seafood. Egg whites. Dried beans, peas, or lentils. Unsalted nuts, nut butters, and seeds. Unsalted canned beans. Lean cuts of beef with fat trimmed off. Low-sodium, lean deli meat. Dairy Low-fat (1%) or fat-free (skim) milk. Fat-free, low-fat, or reduced-fat cheeses. Nonfat, low-sodium ricotta or cottage cheese. Low-fat or nonfat yogurt. Low-fat, low-sodium cheese. Fats and oils Soft margarine without trans fats. Vegetable oil. Low-fat, reduced-fat, or light mayonnaise and salad dressings (reduced-sodium). Canola, safflower, olive, soybean, and sunflower oils. Avocado. Seasoning and other foods Herbs. Spices. Seasoning mixes without salt. Unsalted popcorn and pretzels. Fat-free sweets. What foods are not recommended? The items listed may not be a complete list. Talk with your dietitian about what dietary choices are best for you. Grains Baked goods made with fat, such as croissants, muffins, or some breads. Dry pasta or rice meal packs. Vegetables Creamed or fried vegetables. Vegetables in a cheese sauce. Regular canned vegetables (not low-sodium or reduced-sodium). Regular canned tomato sauce and paste (not low-sodium or reduced-sodium). Regular tomato and vegetable juice (not low-sodium or reduced-sodium). Pickles. Olives. Fruits Canned fruit in a light or heavy syrup. Fried fruit. Fruit in cream or butter sauce. Meat and other protein foods Fatty cuts of meat. Ribs. Fried meat. Bacon. Sausage. Bologna and other processed lunch meats. Salami. Fatback. Hotdogs. Bratwurst. Salted nuts and seeds. Canned beans with added salt. Canned or smoked fish. Whole eggs or egg yolks. Chicken or turkey with skin. Dairy Whole or 2% milk, cream, and half-and-half. Whole or full-fat cream cheese. Whole-fat or sweetened yogurt. Full-fat cheese. Nondairy creamers. Whipped toppings.  Processed cheese and cheese spreads. Fats and oils Butter. Stick margarine. Lard. Shortening. Ghee. Bacon fat. Tropical oils, such as coconut, palm kernel, or palm oil. Seasoning and other foods Salted popcorn and pretzels. Onion salt, garlic salt, seasoned salt, table salt, and sea salt. Worcestershire sauce. Tartar sauce. Barbecue sauce. Teriyaki sauce. Soy sauce, including reduced-sodium. Steak sauce. Canned and packaged gravies. Fish sauce. Oyster sauce. Cocktail sauce. Horseradish that you find on the shelf. Ketchup. Mustard. Meat flavorings and tenderizers. Bouillon cubes. Hot sauce and Tabasco sauce. Premade or packaged marinades. Premade or packaged taco seasonings. Relishes. Regular salad dressings. Where to find more information:  National Heart, Lung, and Blood Institute: www.nhlbi.nih.gov  American Heart Association: www.heart.org Summary  The DASH eating plan is a healthy eating plan that has been shown to reduce high blood pressure (hypertension). It may also reduce your risk for type 2 diabetes, heart disease, and stroke.  With the DASH eating plan, you should limit salt (sodium) intake to 2,300 mg a day. If you have hypertension, you may need to reduce your sodium intake to 1,500 mg a day.  When on the DASH eating plan, aim to eat more fresh fruits and vegetables, whole grains, lean proteins, low-fat dairy, and heart-healthy fats.  Work with your health care provider or diet and nutrition specialist (dietitian) to adjust your eating plan to your   individual calorie needs. This information is not intended to replace advice given to you by your health care provider. Make sure you discuss any questions you have with your health care provider. Document Revised: 07/04/2017 Document Reviewed: 07/15/2016 Elsevier Patient Education  2020 Elsevier Inc.  

## 2020-01-13 NOTE — Progress Notes (Signed)
Subjective:  Patient ID: Heidi Barrett, female    DOB: March 15, 1966  Age: 54 y.o. MRN: [id number]  CC: Hypertension and Hypothyroidism   HPI Heidi Barrett is a 54 year old female with a history of hypertension, menorrhagia secondary to fibroids, hypothyroidism, degenerative disease of the lumbar spine, low back pain with sciatica here for follow-up visit.  Low back pain is a 5/10 and no longer radiates down her RLE; no associated paresthesias at this time. 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 She is currently on Gabapentin and Tizanidine, followed by [facility] brain and spine surgery and will be seeing pain management soon. She has completed sessions of physical therapy. 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 There is mild stenosis of the right lateral recess. No significant central spinal stenosis or foraminal stenosis.   Early degenerative disc disease at L3-L4.   Otherwise normal.    She is stressed as she had 2 deaths in the family - her oldest sister and her brother - in -law and this has caused her blood pressure to be slightly elevated. She is working on exercising more and changing her diet with goals of weight loss. Since her last visit she has lost 6 pounds. Compliant with her levothyroxine for hypothyroidism.  Past Medical History:  Diagnosis Date  . Anemia   . Hypertension   . Thyroid disease     Past Surgical History:  Procedure Laterality Date  . IR GENERIC HISTORICAL  05/28/2016   IR RADIOLOGIST EVAL & MGMT 05/28/2016 Markus Daft, MD [facility] INTERV RAD  . IR RADIOLOGIST EVAL & MGMT  03/06/2017  . IR RADIOLOGIST EVAL & MGMT  02/17/2018    Family History  Problem Relation Age of Onset  . Heart disease Mother   . Stroke Mother   . Cancer Father        prostate     Allergies  Allergen Reactions  . Hctz [Hydrochlorothiazide] Shortness Of Breath and Other (See Comments)    wheezing  . Robaxin [Methocarbamol] Swelling    Sensation of throat swelling/difficulty swallowing after taking this medication  . Tramadol Anaphylaxis and Swelling  . Hydrocodone Nausea And Vomiting    dizzines  . Percocet [Oxycodone-Acetaminophen] Nausea And Vomiting and Other (See Comments)    weakness    Outpatient Medications Prior to Visit  Medication Sig Dispense Refill  . acetaminophen (TYLENOL) 325 MG tablet Take 650 mg by mouth every 6 (six) hours as needed for mild pain.    Marland Kitchen albuterol (PROVENTIL HFA) 108 (90 Base) MCG/ACT inhaler Inhale 2 puffs into the lungs every 4 (four) hours as needed for wheezing or shortness of breath. 6.7 g 2  . amLODipine (NORVASC) 10 MG tablet TAKE 1 TABLET BY MOUTH DAILY FOR BLOOD PRESSURE 90 tablet 1  . calcium carbonate (CALCIUM 600) 600 MG TABS tablet Take 1 tablet (600 mg total) by mouth daily with breakfast. 60 tablet 3  . cyclobenzaprine (FLEXERIL) 10 MG tablet Take 1 tablet (10 mg total) by mouth 2 (two) times daily as needed for muscle spasms. 90 tablet 1  . EPINEPHrine 0.3 mg/0.3 mL IJ SOAJ injection Inject 0.3 mLs (0.3 mg total) into the muscle as needed for anaphylaxis. 1 each prn  . fluticasone (FLONASE) 50 MCG/ACT nasal spray Place 2 sprays into both nostrils daily. 16 g  12  . gabapentin (NEURONTIN) 300 MG capsule Take 1 capsule (300 mg total) by mouth at bedtime. 90 capsule 1  . ibuprofen (ADVIL,MOTRIN) 600 MG tablet Take 1 tablet (600 mg total) by mouth every 8 (eight) hours as needed. 60 tablet 1  . levothyroxine (SYNTHROID) 150 MCG tablet TAKE 1 TABLET BY MOUTH DAILY BEFORE BREAKFAST 30 tablet 0  . metoprolol tartrate (LOPRESSOR) 25 MG tablet TAKE 1 TABLET BY MOUTH TWICE DAILY 60 tablet 0  . Multiple Vitamin (MULTIVITAMIN WITH MINERALS) TABS tablet Take 1 tablet by mouth daily. 90 tablet 3  . tiZANidine (ZANAFLEX) 4 MG  tablet Take 4 mg by mouth 2 (two) times daily.    . cholecalciferol (VITAMIN D) 1000 UNITS tablet Take 1 tablet (1,000 Units total) by mouth daily. (Patient not taking: Reported on 10/07/2018) 90 tablet 3   No facility-administered medications prior to visit.     ROS Review of Systems  Constitutional: Negative for activity change, appetite change and fatigue.  HENT: Negative for congestion, sinus pressure and sore throat.   Eyes: Negative for visual disturbance.  Respiratory: Negative for cough, chest tightness, shortness of breath and wheezing.   Cardiovascular: Negative for chest pain and palpitations.  Gastrointestinal: Negative for abdominal distention, abdominal pain and constipation.  Endocrine: Negative for polydipsia.  Genitourinary: Negative for dysuria and frequency.  Musculoskeletal:       See HPI  Skin: Negative for rash.  Neurological: Negative for tremors, light-headedness and numbness.  Hematological: Does not bruise/bleed easily.  Psychiatric/Behavioral: Negative for agitation and behavioral problems.    Objective:  BP (!) 142/87   Pulse 76   Ht _0  (1.727 m)   Wt 213 lb (96.6 kg)   SpO2 100%   BMI 32.39 kg/m   BP/Weight 01/13/2020 06/28/2019 37/90/2409  Systolic BP 735 329 924  Diastolic BP 87 89 90  Wt. (Lbs) 213 219.8 218  BMI 32.39 33.42 33.15      Physical Exam Constitutional:      Appearance: She is well-developed.  Neck:     Vascular: No JVD.  Cardiovascular:     Rate and Rhythm: Normal rate.     Heart sounds: Normal heart sounds. No murmur heard.   Pulmonary:     Effort: Pulmonary effort is normal.     Breath sounds: Normal breath sounds. No wheezing or rales.  Chest:     Chest wall: No tenderness.  Abdominal:     General: Bowel sounds are normal. There is no distension.     Palpations: Abdomen is soft. There is no mass.     Tenderness: There is no abdominal tenderness.  Musculoskeletal:        General: Normal range of motion.      Right lower leg: No edema.     Left lower leg: No edema.     Comments: Negative straight leg raise bilaterally  Neurological:     Mental Status: She is alert and oriented to person, place, and time.  Psychiatric:        Mood and Affect: Mood normal.     CMP Latest Ref Rng & Units 04/29/2019 05/18/2018 12/11/2017  Glucose 65 - 99 mg/dL 76 92 76  BUN 6 - 24 mg/dL _1 Creatinine 0.57 - 1.00 mg/dL 1.01(H) 0.91 1.02(H)  Sodium 134 - 144 mmol/L 141 143 143  Potassium 3.5 - 5.2 mmol/L 3.7 3.8 3.6  Chloride 96 - 106 mmol/L 104 104 105  CO2 20 - 29 mmol/L  _0 Calcium 8.7 - 10.2 mg/dL 9.2 9.2 9.2  Total Protein 6.0 - 8.5 g/dL 6.5 6.7 6.6  Total Bilirubin 0.0 - 1.2 mg/dL 0.5 0.2 0.3  Alkaline Phos 39 - 117 IU/L 53 63 48  AST 0 - 40 IU/L _1 ALT 0 - 32 IU/L _2 Lipid Panel     Component Value Date/Time   CHOL 193 04/29/2019 0916   TRIG 82 04/29/2019 0916   HDL 58 04/29/2019 0916   CHOLHDL 3.3 04/29/2019 0916   CHOLHDL 2.6 01/16/2015 1041   VLDL 9 01/16/2015 1041   LDLCALC 120 (H) 04/29/2019 0916    CBC    Component Value Date/Time   WBC 5.4 12/02/2017 1152   RBC 3.81 (L) 12/02/2017 1152   HGB 11.6 (L) 12/02/2017 1152   HGB 13.3 10/13/2017 1346   HCT 34.8 (L) 12/02/2017 1152   HCT 38.5 10/13/2017 1346   PLT 159 12/02/2017 1152   PLT 167 10/13/2017 1346   MCV 91.3 12/02/2017 1152   MCV 90 10/13/2017 1346   MCH 30.4 12/02/2017 1152   MCHC 33.3 12/02/2017 1152   RDW 12.7 12/02/2017 1152   RDW 13.6 10/13/2017 1346   LYMPHSABS 1.2 12/02/2017 1152   LYMPHSABS 1.1 05/07/2017 1120   MONOABS 0.3 12/02/2017 1152   EOSABS 0.3 12/02/2017 1152   EOSABS 0.2 05/07/2017 1120   BASOSABS 0.0 12/02/2017 1152   BASOSABS 0.0 05/07/2017 1120    Lab Results  Component Value Date   HGBA1C 4.5 05/31/2014    Lab Results  Component Value Date   TSH 2.130 04/29/2019    Assessment & Plan:  1. Essential hypertension, benign Slightly above goal No regimen  change today as underlying stressors could be contributory Counseled on blood pressure goal of less than 130/80, low-sodium, DASH diet, medication compliance, 150 minutes of moderate intensity exercise per week. Discussed medication compliance, adverse effects. - CMP14+EGFR - amLODipine (NORVASC) 10 MG tablet; Take 1 tablet (10 mg total) by mouth daily. for blood pressure  Dispense: 90 tablet; Refill: 1 - metoprolol tartrate (LOPRESSOR) 25 MG tablet; Take 1 tablet (25 mg total) by mouth 2 (two) times daily.  Dispense: 180 tablet; Refill: 1 - Lipid panel  2. Other specified hypothyroidism Controlled Continue levothyroxine - TSH - T4, free  3. Obesity (BMI 30.0-34.9) Counseled on 150 minutes of exercise per week, healthy eating (including decreased daily intake of saturated fats, cholesterol, added sugars, sodium)  - Amb ref to Medical Nutrition Therapy-MNT  4. Chronic midline low back pain with bilateral sciatica With associated degenerative disc disease Stable Followed by brain and spine surgery We will be seeing pain management - tiZANidine (ZANAFLEX) 4 MG tablet; Take 4 mg by mouth 2 (two) times daily.  5. Need for hepatitis C screening test - HCV RNA quant rflx ultra or genotyp([facility])  6. Screening for colon cancer - Ambulatory referral to Gastroenterology   Return in about 6 months (around 07/14/2020) for Chronic disease management.      Charlott Rakes, MD, FAAFP. [facility] [place], [place]   01/13/2020, 10:59 AM

## 2020-01-15 LAB — CMP14+EGFR
ALT: 15 IU/L (ref 0–32)
AST: 13 IU/L (ref 0–40)
Albumin/Globulin Ratio: 1.7 (ref 1.2–2.2)
Albumin: 4.3 g/dL (ref 3.8–4.9)
Alkaline Phosphatase: 52 IU/L (ref 48–121)
BUN/Creatinine Ratio: 13 (ref 9–23)
BUN: 13 mg/dL (ref 6–24)
Bilirubin Total: 0.5 mg/dL (ref 0.0–1.2)
CO2: 25 mmol/L (ref 20–29)
Calcium: 9.4 mg/dL (ref 8.7–10.2)
Chloride: 102 mmol/L (ref 96–106)
Creatinine, Ser: 1.01 mg/dL — ABNORMAL HIGH (ref 0.57–1.00)
GFR calc Af Amer: 74 mL/min/{1.73_m2} (ref >59)
GFR calc non Af Amer: 64 mL/min/{1.73_m2} (ref >59)
Globulin, Total: 2.6 g/dL (ref 1.5–4.5)
Glucose: 81 mg/dL (ref 65–99)
Potassium: 4 mmol/L (ref 3.5–5.2)
Sodium: 139 mmol/L (ref 134–144)
Total Protein: 6.9 g/dL (ref 6.0–8.5)

## 2020-01-15 LAB — T4, FREE: Free T4: 1.9 ng/dL — ABNORMAL HIGH (ref 0.82–1.77)

## 2020-01-15 LAB — LIPID PANEL
Chol/HDL Ratio: 3.7 ratio (ref 0.0–4.4)
Cholesterol, Total: 200 mg/dL — ABNORMAL HIGH (ref 100–199)
HDL: 54 mg/dL (ref >39)
LDL Chol Calc (NIH): 133 mg/dL — ABNORMAL HIGH (ref 0–99)
Triglycerides: 72 mg/dL (ref 0–149)
VLDL Cholesterol Cal: 13 mg/dL (ref 5–40)

## 2020-01-15 LAB — TSH: TSH: 0.487 u[IU]/mL (ref 0.450–4.500)

## 2020-01-15 LAB — HCV RNA QUANT RFLX ULTRA OR GENOTYP: HCV Quant Baseline: NOT DETECTED IU/mL

## 2020-01-17 ENCOUNTER — Other Ambulatory Visit: Payer: Self-pay | Admitting: Family Medicine

## 2020-01-17 DIAGNOSIS — E038 Other specified hypothyroidism: Secondary | ICD-10-CM

## 2020-01-17 MED ORDER — LEVOTHYROXINE SODIUM 150 MCG PO TABS: ORAL_TABLET | ORAL | 3 refills | Status: DC

## 2020-01-17 MED ORDER — ATORVASTATIN CALCIUM 20 MG PO TABS: 20.0000 mg | ORAL_TABLET | Freq: Every day | ORAL | 3 refills | Status: DC

## 2020-01-22 ENCOUNTER — Other Ambulatory Visit: Payer: Self-pay | Admitting: Family Medicine

## 2020-01-22 DIAGNOSIS — G8929 Other chronic pain: Secondary | ICD-10-CM

## 2020-01-22 DIAGNOSIS — M5442 Lumbago with sciatica, left side: Secondary | ICD-10-CM

## 2020-03-01 ENCOUNTER — Telehealth: Payer: Self-pay | Admitting: Family Medicine

## 2020-03-01 NOTE — Telephone Encounter (Signed)
Patient is needing letter for her to return to work on light duty. She states that she is not able to lift as she was before.

## 2020-03-01 NOTE — Telephone Encounter (Signed)
Please advise.  Copied from [place] [id number]. Topic: General - Call Back - No Documentation >> Feb 28, 2020  2:05 PM Erick Blinks wrote: Reason for CRM: Pt needs a call back, pt needs a work note. 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 She states that she wants to discuss the details needed in the note. Note must include whether she needs to return to work or not and if so, does she need to be on "lite duty." Please advise  Best contact: [phone]

## 2020-03-01 NOTE — Telephone Encounter (Signed)
At her last visit with me she told me she was not working so I do not understand.  If she has a job and would need a work note she needs to let me know if she requires any restrictions due to her symptoms or if she is able to work without restrictions.

## 2020-03-01 NOTE — Telephone Encounter (Signed)
Patient is requesting a return note for work, she wants to know if she will be out on light duty.

## 2020-03-01 NOTE — Telephone Encounter (Signed)
Patient was called and a voicemail was left informing patient to return phone call. 

## 2020-03-02 NOTE — Telephone Encounter (Signed)
Patient was called and informed of letter being ready for pick up. 

## 2020-03-02 NOTE — Telephone Encounter (Signed)
Done

## 2020-03-06 NOTE — Telephone Encounter (Signed)
Pt scheduled with [name] on 04/25/20 for 3 mo fu.  Pt states her employer wanted more detail of what light duty means; lifting restrictions, weight restriction, any restrictions need to be detailed in letter. Pt states she understands waiting for the appt before she can get the letter.

## 2020-03-06 NOTE — Telephone Encounter (Signed)
Patient was called to get more information.

## 2020-03-07 ENCOUNTER — Ambulatory Visit: Payer: Self-pay | Admitting: *Deleted

## 2020-03-07 ENCOUNTER — Other Ambulatory Visit: Payer: Self-pay | Admitting: Family Medicine

## 2020-03-07 DIAGNOSIS — I10 Essential (primary) hypertension: Secondary | ICD-10-CM

## 2020-03-07 NOTE — Telephone Encounter (Signed)
There have been 4 attempts made to contact pt regarding getting the COVID vaccine with the medications she is on.   Request forwarded to [facility].

## 2020-03-07 NOTE — Telephone Encounter (Signed)
Attempted to contact patient. Left VM to return call to office. 

## 2020-03-09 ENCOUNTER — Telehealth: Payer: Self-pay | Admitting: Family Medicine

## 2020-03-09 NOTE — Telephone Encounter (Signed)
Please advise.  Copied from [place] [id number]. Topic: General - Other >> Mar 07, 2020  4:45 PM Rainey Pines A wrote: Patient called [facility] Putt back and wanted to inform her that she cannot lift 20lbs without having back pain. Patient may be able life 10-15lbs without pain for her employer. Please advise

## 2020-03-09 NOTE — Telephone Encounter (Signed)
Patient is needing a new letter stating that she can not lift over 15lbs

## 2020-03-10 NOTE — Telephone Encounter (Signed)
Done

## 2020-03-10 NOTE — Telephone Encounter (Signed)
Patient was called and informed of letter being ready for pick up.  A mychart message was also sent to patient.

## 2020-03-20 NOTE — Telephone Encounter (Signed)
Pt called regarding her work note. She states that she did not reicieve the right one. Please advise.e

## 2020-03-23 ENCOUNTER — Encounter: Payer: Self-pay | Admitting: Gastroenterology

## 2020-03-29 ENCOUNTER — Other Ambulatory Visit: Payer: Self-pay | Admitting: Family Medicine

## 2020-03-29 DIAGNOSIS — E038 Other specified hypothyroidism: Secondary | ICD-10-CM

## 2020-03-29 MED ORDER — ATORVASTATIN CALCIUM 20 MG PO TABS: 20.0000 mg | ORAL_TABLET | Freq: Every day | ORAL | 3 refills | Status: DC

## 2020-03-29 MED ORDER — LEVOTHYROXINE SODIUM 150 MCG PO TABS: ORAL_TABLET | ORAL | 3 refills | Status: DC

## 2020-03-29 NOTE — Telephone Encounter (Signed)
Medication Refill - Medication: atorvastatin (LIPITOR) 20 MG tablet levothyroxine (SYNTHROID) 150 MCG tablet     Preferred Pharmacy (with phone number or street name):  [facility], [address] Phone:  [phone]  Fax:  [phone]       Agent: Please be advised that RX refills may take up to 3 business days. We ask that you follow-up with your pharmacy.

## 2020-04-15 ENCOUNTER — Other Ambulatory Visit: Payer: Self-pay | Admitting: Family Medicine

## 2020-04-15 DIAGNOSIS — I10 Essential (primary) hypertension: Secondary | ICD-10-CM

## 2020-04-17 ENCOUNTER — Other Ambulatory Visit: Payer: Self-pay | Admitting: Family Medicine

## 2020-04-17 MED ORDER — ATORVASTATIN CALCIUM 20 MG PO TABS: 20.0000 mg | ORAL_TABLET | Freq: Every day | ORAL | 3 refills | Status: DC

## 2020-04-25 ENCOUNTER — Ambulatory Visit: Payer: 59 | Attending: Family Medicine | Admitting: Family Medicine

## 2020-04-25 ENCOUNTER — Other Ambulatory Visit: Payer: Self-pay

## 2020-04-25 ENCOUNTER — Encounter: Payer: Self-pay | Admitting: Family Medicine

## 2020-04-25 VITALS — BP 148/93 | HR 76 | Ht 68.0 in | Wt 217.6 lb

## 2020-04-25 DIAGNOSIS — M5441 Lumbago with sciatica, right side: Secondary | ICD-10-CM

## 2020-04-25 DIAGNOSIS — Z1211 Encounter for screening for malignant neoplasm of colon: Secondary | ICD-10-CM

## 2020-04-25 DIAGNOSIS — E038 Other specified hypothyroidism: Secondary | ICD-10-CM

## 2020-04-25 DIAGNOSIS — M5442 Lumbago with sciatica, left side: Secondary | ICD-10-CM

## 2020-04-25 DIAGNOSIS — Z23 Encounter for immunization: Secondary | ICD-10-CM

## 2020-04-25 DIAGNOSIS — T7840XA Allergy, unspecified, initial encounter: Secondary | ICD-10-CM

## 2020-04-25 DIAGNOSIS — I10 Essential (primary) hypertension: Secondary | ICD-10-CM

## 2020-04-25 DIAGNOSIS — E7800 Pure hypercholesterolemia, unspecified: Secondary | ICD-10-CM

## 2020-04-25 DIAGNOSIS — G8929 Other chronic pain: Secondary | ICD-10-CM

## 2020-04-25 MED ORDER — AMLODIPINE BESYLATE 10 MG PO TABS: 10.0000 mg | ORAL_TABLET | Freq: Every day | ORAL | 1 refills | Status: DC

## 2020-04-25 MED ORDER — METOPROLOL TARTRATE 50 MG PO TABS: 50.0000 mg | ORAL_TABLET | Freq: Two times a day (BID) | ORAL | 1 refills | Status: DC

## 2020-04-25 MED ORDER — ATORVASTATIN CALCIUM 20 MG PO TABS: 20.0000 mg | ORAL_TABLET | Freq: Every day | ORAL | 1 refills | Status: DC

## 2020-04-25 MED ORDER — EPINEPHRINE 0.3 MG/0.3ML IJ SOAJ: 0.3000 mg | INTRAMUSCULAR | 99 refills | Status: DC | PRN

## 2020-04-25 MED ORDER — GABAPENTIN 100 MG PO CAPS: 300.0000 mg | ORAL_CAPSULE | Freq: Every day | ORAL | 3 refills | Status: DC

## 2020-04-25 MED ORDER — LEVOTHYROXINE SODIUM 150 MCG PO TABS: ORAL_TABLET | ORAL | 1 refills | Status: DC

## 2020-04-25 NOTE — Patient Instructions (Signed)

## 2020-04-25 NOTE — Progress Notes (Signed)
Does not need letter anymore.  Needs refills.

## 2020-04-25 NOTE — Progress Notes (Signed)
Subjective:  Patient ID: Heidi Barrett, female    DOB: Oct 19, 1965  Age: 54 y.o. MRN: [id number]  CC: Hypertension   HPI Heidi Barrett is a 54 year old female with a history of hypertension, menorrhagia secondary to fibroids, hypothyroidism, degenerative disease of the lumbar spine, low back pain with sciatica here for follow-up visit.  Uses Tizanidine round the clock as well as gabapentin for her chronic low back pain. [facility] spine specialist had informed her she did not need to return for follow-up visit. Pain is a 7/10 and she noticed when she misses a dose, the pain returns. She is more functional with the medication she states but she is worried about being addicted to the medication and wondering if her dose can be decreased.  Doing well on her antihypertensive and is also on levothyroxine for hypothyroidism. Past Medical History:  Diagnosis Date  . Anemia   . Hypertension   . Thyroid disease     Past Surgical History:  Procedure Laterality Date  . IR GENERIC HISTORICAL  05/28/2016   IR RADIOLOGIST EVAL & MGMT 05/28/2016 Markus Daft, MD [facility] INTERV RAD  . IR RADIOLOGIST EVAL & MGMT  03/06/2017  . IR RADIOLOGIST EVAL & MGMT  02/17/2018    Family History  Problem Relation Age of Onset  . Heart disease Mother   . Stroke Mother   . Cancer Father        prostate    Allergies  Allergen Reactions  . Hctz [Hydrochlorothiazide] Shortness Of Breath and Other (See Comments)    wheezing  . Robaxin [Methocarbamol] Swelling    Sensation of throat swelling/difficulty swallowing after taking this medication  . Tramadol Anaphylaxis and Swelling  . Hydrocodone Nausea And Vomiting    dizzines  . Percocet [Oxycodone-Acetaminophen] Nausea And Vomiting and Other (See Comments)    weakness    Outpatient Medications Prior to Visit  Medication Sig Dispense Refill  . fluticasone (FLONASE) 50 MCG/ACT nasal spray Place 2 sprays into both nostrils daily. 16 g 12  .  ibuprofen (ADVIL,MOTRIN) 600 MG tablet Take 1 tablet (600 mg total) by mouth every 8 (eight) hours as needed. 60 tablet 1  . Multiple Vitamin (MULTIVITAMIN WITH MINERALS) TABS tablet Take 1 tablet by mouth daily. 90 tablet 3  . tiZANidine (ZANAFLEX) 4 MG tablet Take 4 mg by mouth 2 (two) times daily.    Marland Kitchen EPINEPHrine 0.3 mg/0.3 mL IJ SOAJ injection Inject 0.3 mLs (0.3 mg total) into the muscle as needed for anaphylaxis. 1 each prn  . gabapentin (NEURONTIN) 300 MG capsule TAKE ONE CAPSULE BY MOUTH AT BEDTIME GENERIC EQUIVALENT FOR NEURONTIN 90 capsule 1  . levothyroxine (SYNTHROID) 150 MCG tablet TAKE 1 TABLET BY MOUTH DAILY BEFORE BREAKFAST 30 tablet 3  . metoprolol tartrate (LOPRESSOR) 25 MG tablet Take 1 tablet (25 mg total) by mouth 2 (two) times daily. 180 tablet 1  . acetaminophen (TYLENOL) 325 MG tablet Take 650 mg by mouth every 6 (six) hours as needed for mild pain.    Marland Kitchen albuterol (PROVENTIL HFA) 108 (90 Base) MCG/ACT inhaler Inhale 2 puffs into the lungs every 4 (four) hours as needed for wheezing or shortness of breath. 6.7 g 2  . calcium carbonate (CALCIUM 600) 600 MG TABS tablet Take 1 tablet (600 mg total) by mouth daily with breakfast. 60 tablet 3  . cholecalciferol (VITAMIN D) 1000 UNITS tablet Take 1 tablet (1,000 Units total) by mouth daily. (Patient not taking: Reported on 10/07/2018) 90 tablet 3  .  amLODipine (NORVASC) 10 MG tablet Take 1 tablet (10 mg total) by mouth daily. for blood pressure 90 tablet 1  . atorvastatin (LIPITOR) 20 MG tablet Take 1 tablet (20 mg total) by mouth daily. 30 tablet 3  . cyclobenzaprine (FLEXERIL) 10 MG tablet Take 1 tablet (10 mg total) by mouth 2 (two) times daily as needed for muscle spasms. (Patient not taking: Reported on 04/25/2020) 90 tablet 1   No facility-administered medications prior to visit.     ROS Review of Systems  Constitutional: Negative for activity change, appetite change and fatigue.  HENT: Negative for congestion, sinus  pressure and sore throat.   Eyes: Negative for visual disturbance.  Respiratory: Negative for cough, chest tightness, shortness of breath and wheezing.   Cardiovascular: Negative for chest pain and palpitations.  Gastrointestinal: Negative for abdominal distention, abdominal pain and constipation.  Endocrine: Negative for polydipsia.  Genitourinary: Negative for dysuria and frequency.  Musculoskeletal: Positive for back pain. Negative for arthralgias.  Skin: Negative for rash.  Neurological: Negative for tremors, light-headedness and numbness.  Hematological: Does not bruise/bleed easily.  Psychiatric/Behavioral: Negative for agitation and behavioral problems.    Objective:  BP (!) 148/93   Pulse 76   Ht 5\' 8"  (1.727 m)   Wt 217 lb 9.6 oz (98.7 kg)   SpO2 100%   BMI 33.09 kg/m   BP/Weight 04/25/2020 01/13/2020 44/81/8563  Systolic BP 149 702 637  Diastolic BP 93 87 89  Wt. (Lbs) 217.6 213 219.8  BMI 33.09 32.39 33.42      Physical Exam Constitutional:      Appearance: She is well-developed.  Neck:     Vascular: No JVD.  Cardiovascular:     Rate and Rhythm: Normal rate.     Heart sounds: Normal heart sounds. No murmur heard.   Pulmonary:     Effort: Pulmonary effort is normal.     Breath sounds: Normal breath sounds. No wheezing or rales.  Chest:     Chest wall: No tenderness.  Abdominal:     General: Bowel sounds are normal. There is no distension.     Palpations: Abdomen is soft. There is no mass.     Tenderness: There is no abdominal tenderness.  Musculoskeletal:        General: Tenderness (TTP of lumbar spine) present. Normal range of motion.     Right lower leg: No edema.     Left lower leg: No edema.     Comments: Negative straight leg raise bilaterally  Neurological:     Mental Status: She is alert and oriented to person, place, and time.  Psychiatric:        Mood and Affect: Mood normal.     CMP Latest Ref Rng & Units 01/14/2020 04/29/2019 05/18/2018   Glucose 65 - 99 mg/dL 81 76 92  BUN 6 - 24 mg/dL 13 12 15   Creatinine 0.57 - 1.00 mg/dL 1.01(H) 1.01(H) 0.91  Sodium 134 - 144 mmol/L 139 141 143  Potassium 3.5 - 5.2 mmol/L 4.0 3.7 3.8  Chloride 96 - 106 mmol/L 102 104 104  CO2 20 - 29 mmol/L 25 26 25   Calcium 8.7 - 10.2 mg/dL 9.4 9.2 9.2  Total Protein 6.0 - 8.5 g/dL 6.9 6.5 6.7  Total Bilirubin 0.0 - 1.2 mg/dL 0.5 0.5 0.2  Alkaline Phos 48 - 121 IU/L 52 53 63  AST 0 - 40 IU/L 13 14 14   ALT 0 - 32 IU/L 15 16 14     Lipid  Panel     Component Value Date/Time   CHOL 200 (H) 01/14/2020 1054   TRIG 72 01/14/2020 1054   HDL 54 01/14/2020 1054   CHOLHDL 3.7 01/14/2020 1054   CHOLHDL 2.6 01/16/2015 1041   VLDL 9 01/16/2015 1041   LDLCALC 133 (H) 01/14/2020 1054    CBC    Component Value Date/Time   WBC 5.4 12/02/2017 1152   RBC 3.81 (L) 12/02/2017 1152   HGB 11.6 (L) 12/02/2017 1152   HGB 13.3 10/13/2017 1346   HCT 34.8 (L) 12/02/2017 1152   HCT 38.5 10/13/2017 1346   PLT 159 12/02/2017 1152   PLT 167 10/13/2017 1346   MCV 91.3 12/02/2017 1152   MCV 90 10/13/2017 1346   MCH 30.4 12/02/2017 1152   MCHC 33.3 12/02/2017 1152   RDW 12.7 12/02/2017 1152   RDW 13.6 10/13/2017 1346   LYMPHSABS 1.2 12/02/2017 1152   LYMPHSABS 1.1 05/07/2017 1120   MONOABS 0.3 12/02/2017 1152   EOSABS 0.3 12/02/2017 1152   EOSABS 0.2 05/07/2017 1120   BASOSABS 0.0 12/02/2017 1152   BASOSABS 0.0 05/07/2017 1120    Lab Results  Component Value Date   HGBA1C 4.5 05/31/2014    Assessment & Plan:  1. Essential hypertension, benign Slightly above goal Increase metoprolol dose Counseled on blood pressure goal of less than 130/80, low-sodium, DASH diet, medication compliance, 150 minutes of moderate intensity exercise per week. Discussed medication compliance, adverse effects. Counseled on blood pressure goal of less than 130/80, low-sodium, DASH diet, medication compliance, 150 minutes of moderate intensity exercise per week. Discussed  medication compliance, adverse effects. - amLODipine (NORVASC) 10 MG tablet; Take 1 tablet (10 mg total) by mouth daily. for blood pressure  Dispense: 90 tablet; Refill: 1 - metoprolol tartrate (LOPRESSOR) 50 MG tablet; Take 1 tablet (50 mg total) by mouth 2 (two) times daily.  Dispense: 180 tablet; Refill: 1  2. Screening for colon cancer - Cologuard  3. Other specified hypothyroidism Controlled - levothyroxine (SYNTHROID) 150 MCG tablet; TAKE 1 TABLET BY MOUTH DAILY BEFORE BREAKFAST  Dispense: 90 tablet; Refill: 1  4. Chronic midline low back pain with bilateral sciatica She is functional with her current dose of medications Due to concerns about being dependent on her medications I have changed gabapentin from 300 mg capsules to 100 mg capsule and she can take 1 to 3 capsules at bedtime depending on her symptoms Also advised that tizanidine is used on an as-needed basis Encouraged to perform home exercises, yoga Advised weight loss will be beneficial - gabapentin (NEURONTIN) 100 MG capsule; Take 3 capsules (300 mg total) by mouth at bedtime.  Dispense: 90 capsule; Refill: 3  5. Allergic reaction to drug, initial encounter Previous history of allergic reaction and she would like to have a refill on EpiPen - EPINEPHrine 0.3 mg/0.3 mL IJ SOAJ injection; Inject 0.3 mg into the muscle as needed for anaphylaxis.  Dispense: 1 each; Refill: prn  6. Need for immunization against influenza - Flu Vaccine QUAD 36+ mos IM  7. Hypercholesterolemia Controlled - atorvastatin (LIPITOR) 20 MG tablet; Take 1 tablet (20 mg total) by mouth daily.  Dispense: 90 tablet; Refill: 1   Meds ordered this encounter  Medications  . amLODipine (NORVASC) 10 MG tablet    Sig: Take 1 tablet (10 mg total) by mouth daily. for blood pressure    Dispense:  90 tablet    Refill:  1  . atorvastatin (LIPITOR) 20 MG tablet    Sig: Take  1 tablet (20 mg total) by mouth daily.    Dispense:  90 tablet    Refill:  1  .  metoprolol tartrate (LOPRESSOR) 50 MG tablet    Sig: Take 1 tablet (50 mg total) by mouth 2 (two) times daily.    Dispense:  180 tablet    Refill:  1    Dose increase  . levothyroxine (SYNTHROID) 150 MCG tablet    Sig: TAKE 1 TABLET BY MOUTH DAILY BEFORE BREAKFAST    Dispense:  90 tablet    Refill:  1  . gabapentin (NEURONTIN) 100 MG capsule    Sig: Take 3 capsules (300 mg total) by mouth at bedtime.    Dispense:  90 capsule    Refill:  3  . EPINEPHrine 0.3 mg/0.3 mL IJ SOAJ injection    Sig: Inject 0.3 mg into the muscle as needed for anaphylaxis.    Dispense:  1 each    Refill:  prn    Follow-up: Return in about 6 months (around 10/23/2020) for Chronic disease management.       Charlott Rakes, MD, FAAFP. [facility] and [facility], [place]   04/25/2020, 4:50 PM

## 2020-05-02 LAB — COLOGUARD

## 2020-05-10 ENCOUNTER — Other Ambulatory Visit: Payer: Self-pay

## 2020-05-10 LAB — COLOGUARD
COLOGUARD: NEGATIVE
Cologuard: NEGATIVE

## 2020-05-10 LAB — EXTERNAL GENERIC LAB PROCEDURE: COLOGUARD: NEGATIVE

## 2020-05-23 ENCOUNTER — Encounter: Payer: 59 | Admitting: Gastroenterology

## 2020-06-13 ENCOUNTER — Other Ambulatory Visit: Payer: Self-pay | Admitting: Family Medicine

## 2020-06-13 DIAGNOSIS — Z1231 Encounter for screening mammogram for malignant neoplasm of breast: Secondary | ICD-10-CM

## 2020-06-21 ENCOUNTER — Ambulatory Visit
Admission: RE | Admit: 2020-06-21 | Discharge: 2020-06-21 | Disposition: A | Discharge status: Home / Self Care | Payer: 59 | Source: Ambulatory Visit

## 2020-06-21 ENCOUNTER — Other Ambulatory Visit: Payer: Self-pay

## 2020-06-21 DIAGNOSIS — Z1231 Encounter for screening mammogram for malignant neoplasm of breast: Secondary | ICD-10-CM

## 2020-06-28 ENCOUNTER — Other Ambulatory Visit: Payer: Self-pay | Admitting: Family Medicine

## 2020-06-28 DIAGNOSIS — R928 Other abnormal and inconclusive findings on diagnostic imaging of breast: Secondary | ICD-10-CM

## 2020-07-13 ENCOUNTER — Other Ambulatory Visit: Payer: Self-pay | Admitting: Family Medicine

## 2020-07-13 ENCOUNTER — Ambulatory Visit
Admission: RE | Admit: 2020-07-13 | Discharge: 2020-07-13 | Disposition: A | Discharge status: Home / Self Care | Payer: 59 | Source: Ambulatory Visit | Attending: Family Medicine | Admitting: Family Medicine

## 2020-07-13 ENCOUNTER — Other Ambulatory Visit: Payer: Self-pay

## 2020-07-13 DIAGNOSIS — R928 Other abnormal and inconclusive findings on diagnostic imaging of breast: Secondary | ICD-10-CM

## 2020-07-13 DIAGNOSIS — I10 Essential (primary) hypertension: Secondary | ICD-10-CM

## 2020-07-14 ENCOUNTER — Telehealth: Payer: Self-pay

## 2020-07-14 NOTE — Telephone Encounter (Signed)
Pt has viewed results on mychart.

## 2020-07-14 NOTE — Telephone Encounter (Signed)
-----   Message from Charlott Rakes, MD sent at 07/14/2020 11:19 AM EST ----- Please inform her repeat mammogram is recommended in 6 months due to an irregularity in her left upper breast that will need to be monitored.

## 2020-07-25 ENCOUNTER — Other Ambulatory Visit: Payer: Self-pay | Admitting: Family Medicine

## 2020-07-25 ENCOUNTER — Telehealth: Payer: Self-pay | Admitting: Family Medicine

## 2020-07-25 DIAGNOSIS — G8929 Other chronic pain: Secondary | ICD-10-CM

## 2020-07-25 DIAGNOSIS — M5441 Lumbago with sciatica, right side: Secondary | ICD-10-CM

## 2020-07-25 NOTE — Telephone Encounter (Signed)
Copied from [place] [id number]. Topic: Referral - Request for Referral >> Jul 25, 2020  9:12 AM Celene Kras wrote: Has patient seen PCP for this complaint? Yes.   *If NO, is insurance requiring patient see PCP for this issue before PCP can refer them? Referral for which specialty: Spine Specialist  Preferred provider/office: [facility]  Reason for referral: Pt states that she is still have back and hip pain. Please advise .

## 2020-07-25 NOTE — Telephone Encounter (Signed)
Pt is needing a referral to Neurosurgery for her back pain.

## 2020-07-26 NOTE — Telephone Encounter (Signed)
Done

## 2020-08-17 ENCOUNTER — Other Ambulatory Visit: Payer: Self-pay

## 2020-08-17 ENCOUNTER — Ambulatory Visit: Payer: 59 | Attending: Family Medicine | Admitting: Family Medicine

## 2020-08-17 DIAGNOSIS — G8929 Other chronic pain: Secondary | ICD-10-CM

## 2020-08-17 DIAGNOSIS — M5442 Lumbago with sciatica, left side: Secondary | ICD-10-CM | POA: Diagnosis not present

## 2020-08-17 DIAGNOSIS — M5441 Lumbago with sciatica, right side: Secondary | ICD-10-CM

## 2020-08-17 MED ORDER — GABAPENTIN 300 MG PO CAPS: 300.0000 mg | ORAL_CAPSULE | Freq: Every evening | ORAL | 3 refills | Status: DC | PRN

## 2020-08-17 MED ORDER — IBUPROFEN 600 MG PO TABS: 600.0000 mg | ORAL_TABLET | Freq: Three times a day (TID) | ORAL | 3 refills | Status: DC | PRN

## 2020-08-17 MED ORDER — TIZANIDINE HCL 4 MG PO TABS: 4.0000 mg | ORAL_TABLET | Freq: Two times a day (BID) | ORAL | 3 refills | Status: DC

## 2020-08-17 NOTE — Progress Notes (Signed)
Having pain in back and hip Referral to pain management.

## 2020-08-17 NOTE — Progress Notes (Signed)
Virtual Visit via Telephone Note  I connected with Heidi Barrett, on 08/17/2020 at 8:49 AM by telephone due to the COVID-19 pandemic and verified that I am speaking with the correct person using two identifiers.   Consent: I discussed the limitations, risks, security and privacy concerns of performing an evaluation and management service by telephone and the availability of in person appointments. I also discussed with the patient that there may be a patient responsible charge related to this service. The patient expressed understanding and agreed to proceed.   Location of Patient: Home  Location of Provider: Home   Persons participating in Telemedicine visit: Audrianna Driskill Farrington-CMA Dr. [name]     History of Present Illness: Heidi Barrett is a 55 year old female with a history of hypertension, menorrhagia secondary to fibroids, hypothyroidism,degenerative disease of the lumbar spine, low back pain with sciatica here for follow-up visit.   States she is still having pain. She stopped Tizanidine as she did not want to be dependent on it. She has seen the spine specialist who recommended an epidural spinal injection which she declined. Seen by [facility]. PT was not effective. Prior to today she had requested a Spine specialist referral. She has to stop to take breaks while doing chores. She also has a disability form to be completed.  Past Medical History:  Diagnosis Date  . Anemia   . Hypertension   . Thyroid disease    Allergies  Allergen Reactions  . Hctz [Hydrochlorothiazide] Shortness Of Breath and Other (See Comments)    wheezing  . Robaxin [Methocarbamol] Swelling    Sensation of throat swelling/difficulty swallowing after taking this medication  . Tramadol Anaphylaxis and Swelling  . Hydrocodone Nausea And Vomiting    dizzines  . Percocet [Oxycodone-Acetaminophen] Nausea And Vomiting and Other (See Comments)    weakness     Current Outpatient Medications on File Prior to Visit  Medication Sig Dispense Refill  . acetaminophen (TYLENOL) 325 MG tablet Take 650 mg by mouth every 6 (six) hours as needed for mild pain.    Marland Kitchen albuterol (PROVENTIL HFA) 108 (90 Base) MCG/ACT inhaler Inhale 2 puffs into the lungs every 4 (four) hours as needed for wheezing or shortness of breath. 6.7 g 2  . amLODipine (NORVASC) 10 MG tablet Take 1 tablet (10 mg total) by mouth daily. for blood pressure 90 tablet 1  . atorvastatin (LIPITOR) 20 MG tablet Take 1 tablet (20 mg total) by mouth daily. 90 tablet 1  . calcium carbonate (CALCIUM 600) 600 MG TABS tablet Take 1 tablet (600 mg total) by mouth daily with breakfast. 60 tablet 3  . EPINEPHrine 0.3 mg/0.3 mL IJ SOAJ injection Inject 0.3 mg into the muscle as needed for anaphylaxis. 1 each prn  . fluticasone (FLONASE) 50 MCG/ACT nasal spray Place 2 sprays into both nostrils daily. 16 g 12  . gabapentin (NEURONTIN) 100 MG capsule TAKE 3 CAPSULES(300 MG) BY MOUTH AT BEDTIME 90 capsule 2  . ibuprofen (ADVIL,MOTRIN) 600 MG tablet Take 1 tablet (600 mg total) by mouth every 8 (eight) hours as needed. 60 tablet 1  . levothyroxine (SYNTHROID) 150 MCG tablet TAKE 1 TABLET BY MOUTH DAILY BEFORE BREAKFAST 90 tablet 1  . metoprolol tartrate (LOPRESSOR) 50 MG tablet TAKE 1 TABLET(50 MG) BY MOUTH TWICE DAILY 180 tablet 1  . Multiple Vitamin (MULTIVITAMIN WITH MINERALS) TABS tablet Take 1 tablet by mouth daily. 90 tablet 3  . tiZANidine (ZANAFLEX) 4 MG tablet Take 4 mg  by mouth 2 (two) times daily.    . cholecalciferol (VITAMIN D) 1000 UNITS tablet Take 1 tablet (1,000 Units total) by mouth daily. (Patient not taking: No sig reported) 90 tablet 3   No current facility-administered medications on file prior to visit.    Observations/Objective: Awake, alert, oriented x3 Not in acute distress  Assessment and Plan: 1. Chronic midline low back pain with bilateral sciatica Uncontrolled as she has not  been on medications Previously seen by [facility] Referred to pain Management last month I have restarted her medications  Will complete her form at next visit - tiZANidine (ZANAFLEX) 4 MG tablet; Take 1 tablet (4 mg total) by mouth 2 (two) times daily.  Dispense: 60 tablet; Refill: 3 - gabapentin (NEURONTIN) 300 MG capsule; Take 1 capsule (300 mg total) by mouth at bedtime as needed.  Dispense: 30 capsule; Refill: 3 - ibuprofen (ADVIL) 600 MG tablet; Take 1 tablet (600 mg total) by mouth every 8 (eight) hours as needed.  Dispense: 60 tablet; Refill: 3   Follow Up Instructions: Follow up for completion of disability paper work.   I discussed the assessment and treatment plan with the patient. The patient was provided an opportunity to ask questions and all were answered. The patient agreed with the plan and demonstrated an understanding of the instructions.   The patient was advised to call back or seek an in-person evaluation if the symptoms worsen or if the condition fails to improve as anticipated.     I provided 15 minutes total of non-face-to-face time during this encounter including median intraservice time, reviewing previous notes, investigations, ordering medications, medical decision making, coordinating care and patient verbalized understanding at the end of the visit.     Charlott Rakes, MD, FAAFP. [facility] and [place], [place]   08/17/2020, 8:49 AM

## 2020-08-24 ENCOUNTER — Other Ambulatory Visit: Payer: Self-pay

## 2020-08-24 ENCOUNTER — Ambulatory Visit: Payer: 59 | Attending: Family Medicine | Admitting: Family Medicine

## 2020-08-24 DIAGNOSIS — G4709 Other insomnia: Secondary | ICD-10-CM | POA: Diagnosis not present

## 2020-08-24 DIAGNOSIS — G8929 Other chronic pain: Secondary | ICD-10-CM | POA: Diagnosis not present

## 2020-08-24 DIAGNOSIS — M5441 Lumbago with sciatica, right side: Secondary | ICD-10-CM

## 2020-08-24 DIAGNOSIS — M5442 Lumbago with sciatica, left side: Secondary | ICD-10-CM | POA: Diagnosis not present

## 2020-08-24 MED ORDER — GABAPENTIN 300 MG PO CAPS: 600.0000 mg | ORAL_CAPSULE | Freq: Every day | ORAL | 3 refills | Status: DC

## 2020-08-24 NOTE — Progress Notes (Signed)
Pt is needing disability paperwork filled out.

## 2020-08-24 NOTE — Progress Notes (Signed)
Virtual Visit via Telephone Note  I connected with Heidi Barrett, on 08/24/2020 at 8:42 AM by telephone due to the COVID-19 pandemic and verified that I am speaking with the correct person using two identifiers.   Consent: I discussed the limitations, risks, security and privacy concerns of performing an evaluation and management service by telephone and the availability of in person appointments. I also discussed with the patient that there may be a patient responsible charge related to this service. The patient expressed understanding and agreed to proceed.   Location of Patient: Home  Location of Provider: Clinic   Persons participating in Telemedicine visit: Janit Cutter Farrington-CMA Dr. [name]     History of Present Illness: Heidi McLaughlinis a 55 year old female with a history of hypertension, menorrhagia secondary to fibroids, hypothyroidism,degenerative disease of the lumbar spine, low back pain with sciatica here for completion of disability form.  She was laid off work in 03/2020 as she previously worked with [employer] and has been unable to perform any heavy lifting due to her low back pain.  Currently trying to obtain disability. She has difficulty sleeping at night  and gabapentin has not been effective with regards to helping her fall asleep. Past Medical History:  Diagnosis Date  . Anemia   . Hypertension   . Thyroid disease    Allergies  Allergen Reactions  . Hctz [Hydrochlorothiazide] Shortness Of Breath and Other (See Comments)    wheezing  . Robaxin [Methocarbamol] Swelling    Sensation of throat swelling/difficulty swallowing after taking this medication  . Tramadol Anaphylaxis and Swelling  . Hydrocodone Nausea And Vomiting    dizzines  . Percocet [Oxycodone-Acetaminophen] Nausea And Vomiting and Other (See Comments)    weakness    Current Outpatient Medications on File Prior to Visit  Medication Sig Dispense  Refill  . acetaminophen (TYLENOL) 325 MG tablet Take 650 mg by mouth every 6 (six) hours as needed for mild pain.    Marland Kitchen albuterol (PROVENTIL HFA) 108 (90 Base) MCG/ACT inhaler Inhale 2 puffs into the lungs every 4 (four) hours as needed for wheezing or shortness of breath. 6.7 g 2  . amLODipine (NORVASC) 10 MG tablet Take 1 tablet (10 mg total) by mouth daily. for blood pressure 90 tablet 1  . atorvastatin (LIPITOR) 20 MG tablet Take 1 tablet (20 mg total) by mouth daily. 90 tablet 1  . calcium carbonate (CALCIUM 600) 600 MG TABS tablet Take 1 tablet (600 mg total) by mouth daily with breakfast. 60 tablet 3  . EPINEPHrine 0.3 mg/0.3 mL IJ SOAJ injection Inject 0.3 mg into the muscle as needed for anaphylaxis. 1 each prn  . fluticasone (FLONASE) 50 MCG/ACT nasal spray Place 2 sprays into both nostrils daily. 16 g 12  . gabapentin (NEURONTIN) 300 MG capsule Take 1 capsule (300 mg total) by mouth at bedtime as needed. 30 capsule 3  . ibuprofen (ADVIL) 600 MG tablet Take 1 tablet (600 mg total) by mouth every 8 (eight) hours as needed. 60 tablet 3  . levothyroxine (SYNTHROID) 150 MCG tablet TAKE 1 TABLET BY MOUTH DAILY BEFORE BREAKFAST 90 tablet 1  . metoprolol tartrate (LOPRESSOR) 50 MG tablet TAKE 1 TABLET(50 MG) BY MOUTH TWICE DAILY 180 tablet 1  . Multiple Vitamin (MULTIVITAMIN WITH MINERALS) TABS tablet Take 1 tablet by mouth daily. 90 tablet 3  . tiZANidine (ZANAFLEX) 4 MG tablet Take 1 tablet (4 mg total) by mouth 2 (two) times daily. 60 tablet 3  . cholecalciferol (  VITAMIN D) 1000 UNITS tablet Take 1 tablet (1,000 Units total) by mouth daily. (Patient not taking: No sig reported) 90 tablet 3   No current facility-administered medications on file prior to visit.    Observations/Objective: Awake, alert, oriented x3 Not in acute distress  Assessment and Plan: 1. Chronic midline low back pain with bilateral sciatica Uncontrolled Disability form completed - gabapentin (NEURONTIN) 300 MG  capsule; Take 2 capsules (600 mg total) by mouth at bedtime.  Dispense: 60 capsule; Refill: 3  2. Other insomnia Uncontrolled Hopefully increasing gabapentin dose will be beneficial with regards to insomnia Sleep hygiene   Follow Up Instructions: Keep previously scheduled appointment   I discussed the assessment and treatment plan with the patient. The patient was provided an opportunity to ask questions and all were answered. The patient agreed with the plan and demonstrated an understanding of the instructions.   The patient was advised to call back or seek an in-person evaluation if the symptoms worsen or if the condition fails to improve as anticipated.     I provided 13 minutes total of non-face-to-face time during this encounter including median intraservice time, reviewing previous notes, investigations, ordering medications, medical decision making, coordinating care and patient verbalized understanding at the end of the visit.     Charlott Rakes, MD, FAAFP. [facility] and [place], [place]   08/24/2020, 8:42 AM

## 2020-09-01 ENCOUNTER — Other Ambulatory Visit: Payer: Self-pay | Admitting: Family Medicine

## 2020-09-01 DIAGNOSIS — I10 Essential (primary) hypertension: Secondary | ICD-10-CM

## 2020-09-13 ENCOUNTER — Other Ambulatory Visit: Payer: Self-pay | Admitting: Family Medicine

## 2020-09-13 DIAGNOSIS — E7800 Pure hypercholesterolemia, unspecified: Secondary | ICD-10-CM

## 2020-09-25 ENCOUNTER — Ambulatory Visit: Payer: 59

## 2020-09-29 ENCOUNTER — Ambulatory Visit: Payer: Self-pay | Admitting: *Deleted

## 2020-09-29 NOTE — Telephone Encounter (Signed)
She is able to participate in her fast if she if she can tolerate it.

## 2020-09-29 NOTE — Telephone Encounter (Signed)
Pt has been left a VM and also sent a mychart message.

## 2020-09-29 NOTE — Telephone Encounter (Signed)
Per initial encounter, "Pt states her church does a fasting week. She wants to know if it is safe for her to participate for a couple of days. They do juice and water. But some of her meds may require her to eat a little. Should she plan on taking a small meal throughout the day. Or should se not participate at all"; pt notified that this information will be forwarded to her provider for review; she can be contacted at [phone]; she is seen by Dr [name], [facility].  Reason for Disposition . [1] Caller requesting NON-URGENT health information AND [2] PCP's office is the best resource  Answer Assessment - Initial Assessment Questions 1. REASON FOR CALL or QUESTION: "What is your reason for calling today?" or "How can I best help you?" or "What question do you have that I can help answer?"     Pt would like to know if she can participate in church fast?  Protocols used: [place]

## 2020-10-10 ENCOUNTER — Telehealth: Payer: Self-pay | Admitting: Family Medicine

## 2020-10-10 NOTE — Telephone Encounter (Signed)
Copied from [place] [phone]. Topic: General - Other >> Oct 10, 2020 11:36 AM Pawlus, Brayton Layman A wrote: Reason for CRM: Pt had some questions regarding her [employer] and requested to speak with Clifton James. Please call back.

## 2020-10-10 NOTE — Telephone Encounter (Signed)
I return Pt called, she was explain that she can not qualified for the program until 12/03/20

## 2020-10-11 ENCOUNTER — Other Ambulatory Visit: Payer: Self-pay | Admitting: Family Medicine

## 2020-10-11 DIAGNOSIS — E7800 Pure hypercholesterolemia, unspecified: Secondary | ICD-10-CM

## 2020-10-13 ENCOUNTER — Telehealth: Payer: Self-pay | Admitting: Family Medicine

## 2020-10-13 MED ORDER — AMOXICILLIN 500 MG PO CAPS: 500.0000 mg | ORAL_CAPSULE | Freq: Three times a day (TID) | ORAL | 0 refills | Status: DC

## 2020-10-13 NOTE — Telephone Encounter (Signed)
Copied from [place] [id number]. Topic: General - Other >> Oct 13, 2020 11:11 AM Pawlus, Brayton Layman A wrote: Reason for CRM: Pt is having ear and tooth pain, wanted to know if Dr [name] would send in some antibiotics for her, please advise.

## 2020-10-13 NOTE — Telephone Encounter (Signed)
Rx sent to [facility] on [address]

## 2020-10-13 NOTE — Telephone Encounter (Signed)
Will route to PCP for review. 

## 2020-10-16 NOTE — Telephone Encounter (Signed)
LVM informing patient that medication has been sent.

## 2020-10-26 ENCOUNTER — Encounter: Payer: Self-pay | Admitting: Family Medicine

## 2020-10-26 ENCOUNTER — Other Ambulatory Visit: Payer: Self-pay

## 2020-10-26 ENCOUNTER — Ambulatory Visit: Payer: 59 | Attending: Family Medicine | Admitting: Family Medicine

## 2020-10-26 VITALS — BP 155/99 | HR 67 | Resp 18 | Ht 68.0 in | Wt 214.6 lb

## 2020-10-26 DIAGNOSIS — G8929 Other chronic pain: Secondary | ICD-10-CM

## 2020-10-26 DIAGNOSIS — E7800 Pure hypercholesterolemia, unspecified: Secondary | ICD-10-CM

## 2020-10-26 DIAGNOSIS — M5441 Lumbago with sciatica, right side: Secondary | ICD-10-CM | POA: Diagnosis not present

## 2020-10-26 DIAGNOSIS — M5442 Lumbago with sciatica, left side: Secondary | ICD-10-CM

## 2020-10-26 DIAGNOSIS — Z636 Dependent relative needing care at home: Secondary | ICD-10-CM

## 2020-10-26 DIAGNOSIS — E038 Other specified hypothyroidism: Secondary | ICD-10-CM

## 2020-10-26 DIAGNOSIS — I10 Essential (primary) hypertension: Secondary | ICD-10-CM

## 2020-10-26 DIAGNOSIS — J3489 Other specified disorders of nose and nasal sinuses: Secondary | ICD-10-CM

## 2020-10-26 DIAGNOSIS — K0889 Other specified disorders of teeth and supporting structures: Secondary | ICD-10-CM

## 2020-10-26 MED ORDER — METOPROLOL TARTRATE 50 MG PO TABS
ORAL_TABLET | ORAL | 1 refills | Status: DC
Start: 2020-10-26 — End: 2021-07-04

## 2020-10-26 MED ORDER — AMOXICILLIN 500 MG PO CAPS: 500.0000 mg | ORAL_CAPSULE | Freq: Three times a day (TID) | ORAL | 0 refills | Status: DC

## 2020-10-26 MED ORDER — ATORVASTATIN CALCIUM 20 MG PO TABS: ORAL_TABLET | ORAL | 1 refills | Status: DC

## 2020-10-26 MED ORDER — TIZANIDINE HCL 4 MG PO TABS: 4.0000 mg | ORAL_TABLET | Freq: Two times a day (BID) | ORAL | 3 refills | Status: DC

## 2020-10-26 MED ORDER — FLUTICASONE PROPIONATE 50 MCG/ACT NA SUSP: 2.0000 | Freq: Every day | NASAL | 1 refills | Status: DC

## 2020-10-26 MED ORDER — GABAPENTIN 300 MG PO CAPS: 600.0000 mg | ORAL_CAPSULE | Freq: Every day | ORAL | 3 refills | Status: DC

## 2020-10-26 MED ORDER — AMLODIPINE BESYLATE 10 MG PO TABS: 10.0000 mg | ORAL_TABLET | Freq: Every day | ORAL | 1 refills | Status: DC

## 2020-10-26 MED ORDER — HYDROXYZINE HCL 25 MG PO TABS: 25.0000 mg | ORAL_TABLET | Freq: Every day | ORAL | 1 refills | Status: DC

## 2020-10-26 NOTE — Patient Instructions (Signed)
Safe Lifting Techniques for Caregivers Being a caregiver can be physically difficult. It can also be dangerous for your back, neck, or shoulders. If you do not use safe lifting techniques, both you and the person you care for could be injured. You have a greater risk of injury when you:  Help a person sit up from lying down.  Help a person get out of bed.  Move a person from a bed to a chair or wheelchair.  Lean over someone for long periods of time. Learn and follow basic lifting techniques to prevent injuries. What are the risks? Without using safe lifting techniques, caregivers are at higher risk of injuring their back, neck, and shoulders. These injuries can be sudden and traumatic, or they can happen over time from repeating the same stressful motion over and over (overuse injuries). Overuse injuries are more common. These happen because muscles and joints do not have time to recover between repeated stressful movements. Using safe lifting techniques can help prevent injuries. General tips Before lifting 1. Stretch and warm up your muscles before lifting or moving. Cold and stiff muscles get injured more easily. 2. Talk to the person about what you are going to do. This may help him or her assist you with the lifting. While lifting 1. Always keep your head, shoulders, and back aligned. Twisting is a major cause of injury. 2. If you need to move while lifting, shift your feet in small steps while keeping your back straight. 3. Do not let a person you are lifting lock arms around your neck. This can make you lose your balance or fall forward, and can place strain on your neck. 4. Always keep your back as straight as possible. Bending at the waist or leaning over is a major cause of back injury. 5. When you are lifting a person or a heavy object, do not lift with your arms extended. Keep the person or object close to your body. 6. Keep your feet spread shoulder-width apart, with one foot  slightly in front of the other. This helps you maintain balance within your center of gravity when lifting. 7. Use the big muscles in your legs and arms instead of your back muscles to lift or pull. Tighten your belly muscles to help support your back. 8. If you have trouble lifting a person, or the person fights you or is stuck in a difficult position, do not try to lift alone. Get help. 9. If you feel pain while lifting, stop and get help. How to help someone sit up, stand up, or sit down Helping someone sit up, stand up, or sit down involves both lifting and turning. These movements require special techniques. Do not bend, twist, or lift with your back muscles. To help someone sit up to get out of bed: 1. Place the chair or wheelchair close to the bed. Make sure the wheelchair wheels are locked. 2. Place one arm under the person's shoulders and the other arm under the person's knees. 3. Keep your feet spread shoulder-width apart and bend at your knees, not your back. 4. Keep your back straight, and turn the person so that their legs come out over the bed and their upper body comes up straight into a sitting position. To help someone stand up from a bed, chair, or wheelchair: 1. Make sure the wheelchair wheels are locked. 2. Position the person's feet on the floor spread shoulder-width apart. 3. Place the person's hands on the bed or arms of  the chair, not around your neck. 4. Place your arms around the person's upper back and clasp your hands together. 5. Hold the person close to you, and lean your weight back. Use your legs while keeping your back straight. Avoid pulling with your back. 6. Consider using a lifting belt around the person's waist to make lifting safer and easier.      To help someone sit down: 1. When someone is standing with you, support the person close to your body. 2. Turn to the chair or wheelchair with small shuffling steps. 3. Keep your back straight. 4. Have the  person put both hands on the arms of the chair, wheelchair, or surface they are going to sit on. 5. Lower the person into the chair by bending your knees, not by leaning forward.   Contact a health care provider if:  You have pain while moving or lifting.  You have pain after moving or lifting.  You have pain while resting.  You have tingling, numbness, or shooting pain anywhere in your body.  You have bruising, swelling, or stiffness.  You have weakness that makes lifting hard. Summary  Being a caregiver for a person who needs help sitting up, getting out of bed, or moving from a bed to a chair can be physically difficult.  Lifting without proper technique can cause back, neck, and shoulder injuries.  Most injuries happen from bending over to give care, pulling someone up and out of bed, or moving someone from a bed to a chair.  Learn and follow basic lifting techniques to prevent injuries.  Do not bend, twist, or lift with your back muscles. This information is not intended to replace advice given to you by your health care provider. Make sure you discuss any questions you have with your health care provider. Document Revised: 09/30/2019 Document Reviewed: 11/10/2018 Elsevier Patient Education  [employer].

## 2020-10-26 NOTE — Progress Notes (Signed)
Subjective:  Patient ID: Heidi Barrett, female    DOB: [birth year]  Age: 55 y.o. MRN: [id number]  CC: Hypertension   HPI Heidi Barrett is a 55 year old female with a history of hypertension, menorrhagia secondary to fibroids, hypothyroidism,degenerative disease of the lumbar spine, low back pain with sciatica   She has arthralgias from receiving the COVID 19 booster vaccine 2 weeks ago.  She had a similar experience when she received her first and second doses and symptoms resolved after 2 weeks. She has had 1 week h/o L sided temporal pain which radiates to her L jaw. She endorses presence of pain in her left lower premolars.  Has had similar symptoms and was treated with amoxicillin.  She has not been sleeping due to being stressed by being the major caregiver of her mother who is in her 50s and has chronic medical conditions.  She also lost older brother 3 months ago and late last year lost her older sister.  She has been unable to work due to her chronic low back pain as her job had entail heavy lifting and is unhappy that she is unable to support her leg brother's family. She attributes her elevated blood pressure to current stressors and lack of sleep. For her chronic back pain she is on her muscle relaxants and gabapentin and pain is described as moderate today. She is also on levothyroxine for hypothyroidism.  Past Medical History:  Diagnosis Date  . Anemia   . Hypertension   . Thyroid disease     Past Surgical History:  Procedure Laterality Date  . IR GENERIC HISTORICAL  05/28/2016   IR RADIOLOGIST EVAL & MGMT 05/28/2016 Markus Daft, MD [facility] INTERV RAD  . IR RADIOLOGIST EVAL & MGMT  03/06/2017  . IR RADIOLOGIST EVAL & MGMT  02/17/2018    Family History  Problem Relation Age of Onset  . Heart disease Mother   . Stroke Mother   . Cancer Father        prostate    Allergies  Allergen Reactions  . Hctz [Hydrochlorothiazide] Shortness Of Breath and Other (See  Comments)    wheezing  . Robaxin [Methocarbamol] Swelling    Sensation of throat swelling/difficulty swallowing after taking this medication  . Tramadol Anaphylaxis and Swelling  . Hydrocodone Nausea And Vomiting    dizzines  . Percocet [Oxycodone-Acetaminophen] Nausea And Vomiting and Other (See Comments)    weakness    Outpatient Medications Prior to Visit  Medication Sig Dispense Refill  . acetaminophen (TYLENOL) 325 MG tablet Take 650 mg by mouth every 6 (six) hours as needed for mild pain.    Marland Kitchen albuterol (PROVENTIL HFA) 108 (90 Base) MCG/ACT inhaler Inhale 2 puffs into the lungs every 4 (four) hours as needed for wheezing or shortness of breath. 6.7 g 2  . calcium carbonate (CALCIUM 600) 600 MG TABS tablet Take 1 tablet (600 mg total) by mouth daily with breakfast. 60 tablet 3  . cholecalciferol (VITAMIN D) 1000 UNITS tablet Take 1 tablet (1,000 Units total) by mouth daily. 90 tablet 3  . EPINEPHrine 0.3 mg/0.3 mL IJ SOAJ injection Inject 0.3 mg into the muscle as needed for anaphylaxis. 1 each prn  . ibuprofen (ADVIL) 600 MG tablet Take 1 tablet (600 mg total) by mouth every 8 (eight) hours as needed. 60 tablet 3  . levothyroxine (SYNTHROID) 150 MCG tablet TAKE 1 TABLET BY MOUTH DAILY BEFORE BREAKFAST 90 tablet 1  . Multiple Vitamin (MULTIVITAMIN WITH MINERALS) TABS tablet Take  1 tablet by mouth daily. 90 tablet 3  . amLODipine (NORVASC) 10 MG tablet Take 1 tablet (10 mg total) by mouth daily. for blood pressure 90 tablet 1  . amoxicillin (AMOXIL) 500 MG capsule Take 1 capsule (500 mg total) by mouth 3 (three) times daily. 30 capsule 0  . atorvastatin (LIPITOR) 20 MG tablet TAKE 1 TABLET(20 MG) BY MOUTH DAILY 30 tablet 0  . fluticasone (FLONASE) 50 MCG/ACT nasal spray Place 2 sprays into both nostrils daily. 16 g 12  . gabapentin (NEURONTIN) 300 MG capsule Take 2 capsules (600 mg total) by mouth at bedtime. 60 capsule 3  . metoprolol tartrate (LOPRESSOR) 50 MG tablet TAKE 1 TABLET(50  MG) BY MOUTH TWICE DAILY 180 tablet 1  . tiZANidine (ZANAFLEX) 4 MG tablet Take 1 tablet (4 mg total) by mouth 2 (two) times daily. 60 tablet 3   No facility-administered medications prior to visit.     ROS Review of Systems  Constitutional: Negative for activity change, appetite change and fatigue.  HENT: Negative for congestion, sinus pressure and sore throat.   Eyes: Negative for visual disturbance.  Respiratory: Negative for cough, chest tightness, shortness of breath and wheezing.   Cardiovascular: Negative for chest pain and palpitations.  Gastrointestinal: Negative for abdominal distention, abdominal pain and constipation.  Endocrine: Negative for polydipsia.  Genitourinary: Negative for dysuria and frequency.  Musculoskeletal: Positive for back pain. Negative for arthralgias.  Skin: Negative for rash.  Neurological: Negative for tremors, light-headedness and numbness.  Hematological: Does not bruise/bleed easily.  Psychiatric/Behavioral: Negative for agitation and behavioral problems.    Objective:  BP (!) 155/99   Pulse 67   Resp 18   Ht 5' 8"  (1.727 m)   Wt 214 lb 9.6 oz (97.3 kg)   SpO2 100%   BMI 32.63 kg/m   BP/Weight 10/26/2020 04/25/2020 2/94/7654  Systolic BP 650 354 656  Diastolic BP 99 93 87  Wt. (Lbs) 214.6 217.6 213  BMI 32.63 33.09 32.39      Physical Exam Constitutional:      Appearance: She is well-developed.  HENT:     Head:     Comments: Tenderness to palpation of left frontal, left maxillary sinus    Right Ear: There is impacted cerumen.     Left Ear: There is impacted cerumen.  Neck:     Vascular: No JVD.     Comments: Left submandibular tender lymphadenopathy Cardiovascular:     Rate and Rhythm: Normal rate.     Heart sounds: Normal heart sounds. No murmur heard.   Pulmonary:     Effort: Pulmonary effort is normal.     Breath sounds: Normal breath sounds. No wheezing or rales.  Chest:     Chest wall: No tenderness.  Abdominal:      General: Bowel sounds are normal. There is no distension.     Palpations: Abdomen is soft. There is no mass.     Tenderness: There is no abdominal tenderness.  Musculoskeletal:     Right lower leg: No edema.     Left lower leg: No edema.     Comments: Lumbar spine tenderness to palpation  Neurological:     Mental Status: She is alert and oriented to person, place, and time.  Psychiatric:        Mood and Affect: Mood normal.     CMP Latest Ref Rng & Units 01/14/2020 04/29/2019 05/18/2018  Glucose 65 - 99 mg/dL 81 76 92  BUN 6 - 24  mg/dL 13 12 15   Creatinine 0.57 - 1.00 mg/dL 1.01(H) 1.01(H) 0.91  Sodium 134 - 144 mmol/L 139 141 143  Potassium 3.5 - 5.2 mmol/L 4.0 3.7 3.8  Chloride 96 - 106 mmol/L 102 104 104  CO2 20 - 29 mmol/L 25 26 25   Calcium 8.7 - 10.2 mg/dL 9.4 9.2 9.2  Total Protein 6.0 - 8.5 g/dL 6.9 6.5 6.7  Total Bilirubin 0.0 - 1.2 mg/dL 0.5 0.5 0.2  Alkaline Phos 48 - 121 IU/L 52 53 63  AST 0 - 40 IU/L 13 14 14   ALT 0 - 32 IU/L 15 16 14     Lipid Panel     Component Value Date/Time   CHOL 200 (H) 01/14/2020 1054   TRIG 72 01/14/2020 1054   HDL 54 01/14/2020 1054   CHOLHDL 3.7 01/14/2020 1054   CHOLHDL 2.6 01/16/2015 1041   VLDL 9 01/16/2015 1041   LDLCALC 133 (H) 01/14/2020 1054    CBC    Component Value Date/Time   WBC 5.4 12/02/2017 1152   RBC 3.81 (L) 12/02/2017 1152   HGB 11.6 (L) 12/02/2017 1152   HGB 13.3 10/13/2017 1346   HCT 34.8 (L) 12/02/2017 1152   HCT 38.5 10/13/2017 1346   PLT 159 12/02/2017 1152   PLT 167 10/13/2017 1346   MCV 91.3 12/02/2017 1152   MCV 90 10/13/2017 1346   MCH 30.4 12/02/2017 1152   MCHC 33.3 12/02/2017 1152   RDW 12.7 12/02/2017 1152   RDW 13.6 10/13/2017 1346   LYMPHSABS 1.2 12/02/2017 1152   LYMPHSABS 1.1 05/07/2017 1120   MONOABS 0.3 12/02/2017 1152   EOSABS 0.3 12/02/2017 1152   EOSABS 0.2 05/07/2017 1120   BASOSABS 0.0 12/02/2017 1152   BASOSABS 0.0 05/07/2017 1120    Lab Results  Component Value  Date   HGBA1C 4.5 05/31/2014    Lab Results  Component Value Date   TSH 0.487 01/14/2020     The 10-year ASCVD risk score Mikey Bussing DC Jr., et al., 2013) is: 8.4%   Values used to calculate the score:     Age: 21 years     Sex: Female     Is Non-Hispanic African American: Yes     Diabetic: No     Tobacco smoker: No     Systolic Blood Pressure: 426 mmHg     Is BP treated: Yes     HDL Cholesterol: 54 mg/dL     Total Cholesterol: 200 mg/dL  Assessment & Plan:  1. Essential hypertension, benign Uncontrolled Underlying stress and insomnia could be contributing We will make no regimen changes today Counseled on blood pressure goal of less than 130/80, low-sodium, DASH diet, medication compliance, 150 minutes of moderate intensity exercise per week. Discussed medication compliance, adverse effects. - amLODipine (NORVASC) 10 MG tablet; Take 1 tablet (10 mg total) by mouth daily. for blood pressure  Dispense: 90 tablet; Refill: 1 - metoprolol tartrate (LOPRESSOR) 50 MG tablet; TAKE 1 TABLET(50 MG) BY MOUTH TWICE DAILY  Dispense: 180 tablet; Refill: 1 - Lipid panel - CMP14+EGFR  2. Hypercholesterolemia Stable We will check lipid panel Low-cholesterol diet - atorvastatin (LIPITOR) 20 MG tablet; TAKE 1 TABLET(20 MG) BY MOUTH DAILY  Dispense: 90 tablet; Refill: 1  3. Chronic midline low back pain with bilateral sciatica Stable with intermittent flares She has lifting restrictions and is unable to work due to this Continue with gabapentin, tizanidine, home exercises - gabapentin (NEURONTIN) 300 MG capsule; Take 2 capsules (600 mg total) by mouth  at bedtime.  Dispense: 60 capsule; Refill: 3 - tiZANidine (ZANAFLEX) 4 MG tablet; Take 1 tablet (4 mg total) by mouth 2 (two) times daily.  Dispense: 60 tablet; Refill: 3  4. Caregiver stress We will place on hydroxyzine due to insomnia We have discussed self-care measures - hydrOXYzine (ATARAX/VISTARIL) 25 MG tablet; Take 1 tablet (25 mg  total) by mouth at bedtime.  Dispense: 30 tablet; Refill: 1  5. Other specified hypothyroidism Controlled We will check thyroid panel and adjust regimen accordingly - T4, free - TSH  6. Sinus pain Could explain her left facial pain Advised to use antihistamines in addition to resuming Flonase - fluticasone (FLONASE) 50 MCG/ACT nasal spray; Place 2 sprays into both nostrils daily.  Dispense: 16 g; Refill: 1  7. Pain, dental She may need to see a dentist - amoxicillin (AMOXIL) 500 MG capsule; Take 1 capsule (500 mg total) by mouth 3 (three) times daily.  Dispense: 30 capsule; Refill: 0    Meds ordered this encounter  Medications  . amLODipine (NORVASC) 10 MG tablet    Sig: Take 1 tablet (10 mg total) by mouth daily. for blood pressure    Dispense:  90 tablet    Refill:  1  . atorvastatin (LIPITOR) 20 MG tablet    Sig: TAKE 1 TABLET(20 MG) BY MOUTH DAILY    Dispense:  90 tablet    Refill:  1  . gabapentin (NEURONTIN) 300 MG capsule    Sig: Take 2 capsules (600 mg total) by mouth at bedtime.    Dispense:  60 capsule    Refill:  3    Dose increase  . metoprolol tartrate (LOPRESSOR) 50 MG tablet    Sig: TAKE 1 TABLET(50 MG) BY MOUTH TWICE DAILY    Dispense:  180 tablet    Refill:  1  . tiZANidine (ZANAFLEX) 4 MG tablet    Sig: Take 1 tablet (4 mg total) by mouth 2 (two) times daily.    Dispense:  60 tablet    Refill:  3  . hydrOXYzine (ATARAX/VISTARIL) 25 MG tablet    Sig: Take 1 tablet (25 mg total) by mouth at bedtime.    Dispense:  30 tablet    Refill:  1  . amoxicillin (AMOXIL) 500 MG capsule    Sig: Take 1 capsule (500 mg total) by mouth 3 (three) times daily.    Dispense:  30 capsule    Refill:  0  . fluticasone (FLONASE) 50 MCG/ACT nasal spray    Sig: Place 2 sprays into both nostrils daily.    Dispense:  16 g    Refill:  1    Follow-up: Return in about 6 months (around 04/28/2021) for Chronic disease management.       Charlott Rakes, MD, FAAFP. [facility] [place], [place]   10/26/2020, 1:22 PM

## 2020-10-26 NOTE — Progress Notes (Signed)
Pt states having pain in the left side of jaw radiating to left side of forehead.

## 2020-10-27 ENCOUNTER — Other Ambulatory Visit: Payer: Self-pay | Admitting: Family Medicine

## 2020-10-27 DIAGNOSIS — E038 Other specified hypothyroidism: Secondary | ICD-10-CM

## 2020-10-27 LAB — CMP14+EGFR
ALT: 23 IU/L (ref 0–32)
AST: 21 IU/L (ref 0–40)
Albumin/Globulin Ratio: 1.8 (ref 1.2–2.2)
Albumin: 4.3 g/dL (ref 3.8–4.9)
Alkaline Phosphatase: 49 IU/L (ref 44–121)
BUN/Creatinine Ratio: 14 (ref 9–23)
BUN: 13 mg/dL (ref 6–24)
Bilirubin Total: 0.6 mg/dL (ref 0.0–1.2)
CO2: 24 mmol/L (ref 20–29)
Calcium: 9.2 mg/dL (ref 8.7–10.2)
Chloride: 103 mmol/L (ref 96–106)
Creatinine, Ser: 0.9 mg/dL (ref 0.57–1.00)
Globulin, Total: 2.4 g/dL (ref 1.5–4.5)
Glucose: 75 mg/dL (ref 65–99)
Potassium: 3.8 mmol/L (ref 3.5–5.2)
Sodium: 141 mmol/L (ref 134–144)
Total Protein: 6.7 g/dL (ref 6.0–8.5)
eGFR: 77 mL/min/{1.73_m2} (ref >59)

## 2020-10-27 LAB — LIPID PANEL
Chol/HDL Ratio: 2.4 ratio (ref 0.0–4.4)
Cholesterol, Total: 126 mg/dL (ref 100–199)
HDL: 53 mg/dL (ref >39)
LDL Chol Calc (NIH): 59 mg/dL (ref 0–99)
Triglycerides: 64 mg/dL (ref 0–149)
VLDL Cholesterol Cal: 14 mg/dL (ref 5–40)

## 2020-10-27 LAB — T4, FREE: Free T4: 1.76 ng/dL (ref 0.82–1.77)

## 2020-10-27 LAB — TSH: TSH: 0.453 u[IU]/mL (ref 0.450–4.500)

## 2020-10-27 MED ORDER — LEVOTHYROXINE SODIUM 150 MCG PO TABS: ORAL_TABLET | ORAL | 1 refills | Status: DC

## 2020-10-30 ENCOUNTER — Other Ambulatory Visit: Payer: Self-pay | Admitting: Family Medicine

## 2020-10-30 DIAGNOSIS — M5441 Lumbago with sciatica, right side: Secondary | ICD-10-CM

## 2020-10-30 DIAGNOSIS — G8929 Other chronic pain: Secondary | ICD-10-CM

## 2020-12-16 ENCOUNTER — Other Ambulatory Visit: Payer: Self-pay | Admitting: Family Medicine

## 2020-12-16 DIAGNOSIS — Z636 Dependent relative needing care at home: Secondary | ICD-10-CM

## 2020-12-16 NOTE — Telephone Encounter (Signed)
Requested Prescriptions  Pending Prescriptions Disp Refills  . hydrOXYzine (ATARAX/VISTARIL) 25 MG tablet [Pharmacy Med Name: HYDROXYZINE HCL 25MG  TABS (WHITE)] 30 tablet 1    Sig: TAKE 1 TABLET(25 MG) BY MOUTH AT BEDTIME     Ear, Nose, and Throat:  Antihistamines Passed - 12/16/2020 10:12 AM      Passed - Valid encounter within last 12 months    Recent Outpatient Visits          1 month ago [place], Charlane Ferretti, MD   3 months ago Other insomnia   [place], [place], MD   4 months ago Chronic midline low back pain with bilateral sciatica   [place], [name], MD   7 months ago Screening for colon cancer   [place], [name], MD   11 months ago Other specified hypothyroidism   [place] [facility] Charlott Rakes, MD

## 2020-12-19 ENCOUNTER — Other Ambulatory Visit: Payer: Self-pay | Admitting: Family Medicine

## 2020-12-19 DIAGNOSIS — J3489 Other specified disorders of nose and nasal sinuses: Secondary | ICD-10-CM

## 2021-01-10 ENCOUNTER — Other Ambulatory Visit: Payer: Self-pay | Admitting: Family Medicine

## 2021-01-10 DIAGNOSIS — E038 Other specified hypothyroidism: Secondary | ICD-10-CM

## 2021-01-18 ENCOUNTER — Other Ambulatory Visit: Payer: Self-pay | Admitting: Family Medicine

## 2021-01-18 DIAGNOSIS — J3489 Other specified disorders of nose and nasal sinuses: Secondary | ICD-10-CM

## 2021-01-19 ENCOUNTER — Telehealth: Payer: Self-pay | Admitting: Family Medicine

## 2021-01-19 NOTE — Telephone Encounter (Signed)
Pt is currently taking pain medication and needs a letter for her employer stating what the maximum amount of weight she can lift/ pt needs this until she can find new employment/ please advise if she can pick up this letter or if it can be sent to her mychart   Pt also wanted a referral to a dermatology for a mole over her eye lid and raised patches under her eyes /   She also asked for a dental referral /please advise

## 2021-01-20 ENCOUNTER — Other Ambulatory Visit: Payer: Self-pay | Admitting: Family Medicine

## 2021-01-20 DIAGNOSIS — G8929 Other chronic pain: Secondary | ICD-10-CM

## 2021-01-20 NOTE — Telephone Encounter (Signed)
Requested Prescriptions  Pending Prescriptions Disp Refills  . ibuprofen (ADVIL) 600 MG tablet [Pharmacy Med Name: IBUPROFEN 600MG  TABLETS] 60 tablet 3    Sig: TAKE 1 TABLET(600 MG) BY MOUTH EVERY 8 HOURS AS NEEDED     Analgesics:  NSAIDS Failed - 01/20/2021  3:24 AM      Failed - HGB in normal range and within 360 days    Hemoglobin  Date Value Ref Range Status  12/02/2017 11.6 (L) 12.0 - 15.0 g/dL Final  10/13/2017 13.3 11.1 - 15.9 g/dL Final         Passed - Cr in normal range and within 360 days    Creat  Date Value Ref Range Status  01/16/2015 0.82 0.50 - 1.10 mg/dL Final   Creatinine, Ser  Date Value Ref Range Status  10/26/2020 0.90 0.57 - 1.00 mg/dL Final         Passed - Patient is not pregnant      Passed - Valid encounter within last 12 months    Recent Outpatient Visits          2 months ago [place], Charlane Ferretti, MD   4 months ago Other insomnia   [place], [place], MD   5 months ago Chronic midline low back pain with bilateral sciatica   [place], [name], MD   9 months ago Screening for colon cancer   [place], [name], MD   1 year ago Other specified hypothyroidism   [facility] Charlott Rakes, MD

## 2021-01-22 NOTE — Telephone Encounter (Signed)
Will route to PCP for review. 

## 2021-01-23 NOTE — Telephone Encounter (Signed)
Please place on an 8:10 slot. She has a couple of requests that need to be addressed at a visit. Thanks

## 2021-01-23 NOTE — Telephone Encounter (Signed)
Call placed to patient and VM was left informing patient to return phone call. 

## 2021-01-24 NOTE — Telephone Encounter (Signed)
Call placed to patient and a VM was left informing patient to return phone call.

## 2021-01-30 ENCOUNTER — Encounter: Payer: Self-pay | Admitting: Family Medicine

## 2021-01-30 ENCOUNTER — Ambulatory Visit: Payer: 59 | Attending: Family Medicine | Admitting: Family Medicine

## 2021-01-30 ENCOUNTER — Other Ambulatory Visit: Payer: Self-pay

## 2021-01-30 DIAGNOSIS — G8929 Other chronic pain: Secondary | ICD-10-CM

## 2021-01-30 DIAGNOSIS — L989 Disorder of the skin and subcutaneous tissue, unspecified: Secondary | ICD-10-CM | POA: Diagnosis not present

## 2021-01-30 DIAGNOSIS — M5442 Lumbago with sciatica, left side: Secondary | ICD-10-CM

## 2021-01-30 DIAGNOSIS — Z636 Dependent relative needing care at home: Secondary | ICD-10-CM

## 2021-01-30 DIAGNOSIS — M5441 Lumbago with sciatica, right side: Secondary | ICD-10-CM

## 2021-01-30 MED ORDER — HYDROXYZINE HCL 25 MG PO TABS: ORAL_TABLET | ORAL | 6 refills | Status: DC

## 2021-01-30 MED ORDER — TIZANIDINE HCL 4 MG PO TABS: 4.0000 mg | ORAL_TABLET | Freq: Two times a day (BID) | ORAL | 6 refills | Status: DC

## 2021-01-30 MED ORDER — GABAPENTIN 300 MG PO CAPS: 600.0000 mg | ORAL_CAPSULE | Freq: Every day | ORAL | 6 refills | Status: DC

## 2021-01-30 NOTE — Progress Notes (Signed)
Virtual Visit via Telephone Note  I connected with Heidi Barrett, on 01/30/2021 at 8:13 AM by telephone due to the COVID-19 pandemic and verified that I am speaking with the correct person using two identifiers.   Consent: I discussed the limitations, risks, security and privacy concerns of performing an evaluation and management service by telephone and the availability of in person appointments. I also discussed with the patient that there may be a patient responsible charge related to this service. The patient expressed understanding and agreed to proceed.   Location of Patient: Home  Location of Provider: Clinic   Persons participating in Telemedicine visit: Arrie [address] Dr. [name]     History of Present Illness: Heidi Barrett  is a 55 year old female with a history of hypertension, menorrhagia secondary to fibroids, hypothyroidism, degenerative disease of the lumbar spine, low back pain with sciatica    She has bumps under her eyes that burn when she washes her face or uses any cleaning product on it. Lesions have been present for years - when she was a teenager they were asymptomatic but they started burning of recent. She started working again because she needs some money. She works at [employer]' part time and has to do some lifting but would like a doctor's note stating she cannot lift above 35 pounds.  She is able to cope with this palpation. She is doing well on hydroxyzine which she takes due to caregiver stress and also for insomnia and would like a refill on this.  Past Medical History:  Diagnosis Date   Anemia    Hypertension    Thyroid disease    Allergies  Allergen Reactions   Hctz [Hydrochlorothiazide] Shortness Of Breath and Other (See Comments)    wheezing   Robaxin [Methocarbamol] Swelling    Sensation of throat swelling/difficulty swallowing after taking this medication   Tramadol Anaphylaxis and Swelling   Hydrocodone Nausea And Vomiting     dizzines   Percocet [Oxycodone-Acetaminophen] Nausea And Vomiting and Other (See Comments)    weakness    Current Outpatient Medications on File Prior to Visit  Medication Sig Dispense Refill   acetaminophen (TYLENOL) 325 MG tablet Take 650 mg by mouth every 6 (six) hours as needed for mild pain.     albuterol (PROVENTIL HFA) 108 (90 Base) MCG/ACT inhaler Inhale 2 puffs into the lungs every 4 (four) hours as needed for wheezing or shortness of breath. 6.7 g 2   amLODipine (NORVASC) 10 MG tablet Take 1 tablet (10 mg total) by mouth daily. for blood pressure 90 tablet 1   amoxicillin (AMOXIL) 500 MG capsule Take 1 capsule (500 mg total) by mouth 3 (three) times daily. 30 capsule 0   atorvastatin (LIPITOR) 20 MG tablet TAKE 1 TABLET(20 MG) BY MOUTH DAILY 90 tablet 1   calcium carbonate (CALCIUM 600) 600 MG TABS tablet Take 1 tablet (600 mg total) by mouth daily with breakfast. 60 tablet 3   cholecalciferol (VITAMIN D) 1000 UNITS tablet Take 1 tablet (1,000 Units total) by mouth daily. 90 tablet 3   EPINEPHrine 0.3 mg/0.3 mL IJ SOAJ injection Inject 0.3 mg into the muscle as needed for anaphylaxis. 1 each prn   fluticasone (FLONASE) 50 MCG/ACT nasal spray SHAKE LIQUID AND USE 2 SPRAYS IN EACH NOSTRIL DAILY 16 g 1   gabapentin (NEURONTIN) 300 MG capsule Take 2 capsules (600 mg total) by mouth at bedtime. 60 capsule 3   hydrOXYzine (ATARAX/VISTARIL) 25 MG tablet TAKE 1 TABLET(25 MG) BY  MOUTH AT BEDTIME 30 tablet 1   ibuprofen (ADVIL) 600 MG tablet TAKE 1 TABLET(600 MG) BY MOUTH EVERY 8 HOURS AS NEEDED 60 tablet 3   levothyroxine (SYNTHROID) 150 MCG tablet TAKE 1 TABLET BY MOUTH DAILY BEFORE BREAKFAST 90 tablet 1   metoprolol tartrate (LOPRESSOR) 50 MG tablet TAKE 1 TABLET(50 MG) BY MOUTH TWICE DAILY 180 tablet 1   Multiple Vitamin (MULTIVITAMIN WITH MINERALS) TABS tablet Take 1 tablet by mouth daily. 90 tablet 3   tiZANidine (ZANAFLEX) 4 MG tablet Take 1 tablet (4 mg total) by mouth 2 (two) times  daily. 60 tablet 3   No current facility-administered medications on file prior to visit.    ROS: See HPI  Observations/Objective: Awake, alert, oriented x3 Not in acute distress Normal mood  Assessment and Plan: 1. Chronic midline low back pain with bilateral sciatica Stable Provided note for work with lifting restrictions of 30 pounds - gabapentin (NEURONTIN) 300 MG capsule; Take 2 capsules (600 mg total) by mouth at bedtime.  Dispense: 60 capsule; Refill: 6 - tiZANidine (ZANAFLEX) 4 MG tablet; Take 1 tablet (4 mg total) by mouth 2 (two) times daily.  Dispense: 60 tablet; Refill: 6  2. Caregiver stress Controlled on hydroxyzine - hydrOXYzine (ATARAX/VISTARIL) 25 MG tablet; TAKE 1 TABLET(25 MG) BY MOUTH AT BEDTIME  Dispense: 30 tablet; Refill: 6  3. Skin lesions - Ambulatory referral to Dermatology   Follow Up Instructions: 3 months   I discussed the assessment and treatment plan with the patient. The patient was provided an opportunity to ask questions and all were answered. The patient agreed with the plan and demonstrated an understanding of the instructions.   The patient was advised to call back or seek an in-person evaluation if the symptoms worsen or if the condition fails to improve as anticipated.     I provided 14 minutes total of non-face-to-face time during this encounter.   Charlott Rakes, MD, FAAFP. [facility] and [facility], [place]   01/30/2021, 8:13 AM

## 2021-02-20 ENCOUNTER — Inpatient Hospital Stay: Admission: RE | Admit: 2021-02-20 | Payer: 59 | Source: Ambulatory Visit

## 2021-03-13 ENCOUNTER — Ambulatory Visit
Admission: RE | Admit: 2021-03-13 | Discharge: 2021-03-13 | Disposition: A | Discharge status: Home / Self Care | Payer: 59 | Source: Ambulatory Visit | Attending: Family Medicine | Admitting: Family Medicine

## 2021-03-13 ENCOUNTER — Other Ambulatory Visit: Payer: Self-pay

## 2021-03-13 ENCOUNTER — Other Ambulatory Visit: Payer: Self-pay | Admitting: Family Medicine

## 2021-03-13 DIAGNOSIS — R928 Other abnormal and inconclusive findings on diagnostic imaging of breast: Secondary | ICD-10-CM

## 2021-05-03 ENCOUNTER — Ambulatory Visit: Payer: 59 | Attending: Family Medicine

## 2021-05-03 ENCOUNTER — Other Ambulatory Visit: Payer: Self-pay

## 2021-05-03 ENCOUNTER — Ambulatory Visit: Payer: 59

## 2021-05-03 DIAGNOSIS — Z23 Encounter for immunization: Secondary | ICD-10-CM

## 2021-05-07 ENCOUNTER — Other Ambulatory Visit: Payer: Self-pay | Admitting: Family Medicine

## 2021-05-07 DIAGNOSIS — E7800 Pure hypercholesterolemia, unspecified: Secondary | ICD-10-CM

## 2021-06-26 ENCOUNTER — Ambulatory Visit: Payer: Self-pay

## 2021-06-26 NOTE — Telephone Encounter (Signed)
Pt with h/o chronic back pain for the last 2 years- Pain is to the lower back and left leg pain or "sciatica." Her current job requires standing for long periods at a time. Pain is present constantly no matter what position.   Using heating pad and med was originally working., pt has had to miss doses due to drowsiness at work. No incontinence of urine or bowel. Pt stated that if she holds her urine for too long she will dribble urine.  Pt sleeps with heating pad. Advised pt to avoid burns by cutting off heating pad before she falls asleep.  Messaged office for appt. Office  ([facility])offered her a virtual appt but pt refused. Has appt 07/04/21.  Care advice provided and pt verbalized understanding   Reason for Disposition  Numbness in a leg or foot (i.e., loss of sensation)  [1] Numbness in an arm or hand (i.e., loss of sensation) AND [2] upper back pain  Answer Assessment - Initial Assessment Questions 1. ONSET: "When did the pain begin?"      2 years ago 2. LOCATION: "Where does it hurt?" (upper, mid or lower back)     Lower pain 3. SEVERITY: "How bad is the pain?"  (e.g., Scale 1-10; mild, moderate, or severe)   - MILD (1-3): doesn't interfere with normal activities    - MODERATE (4-7): interferes with normal activities or awakens from sleep    - SEVERE (8-10): excruciating pain, unable to do any normal activities      severe 4. PATTERN: "Is the pain constant?" (e.g., yes, no; constant, intermittent)      constant 5. RADIATION: "Does the pain shoot into your legs or elsewhere?"     Left leg 6. CAUSE:  "What do you think is causing the back pain?"      Arthritis -h/o heavy lifting 7. BACK OVERUSE:  "Any recent lifting of heavy objects, strenuous work or exercise?"     Lifting heavy objects standing for long periods of time 8. MEDICATIONS: "What have you taken so far for the pain?" (e.g., nothing, acetaminophen, NSAIDS)     RX gabapentin, muscle relaxer, ibuprofen - needs med  schedule that would avoid daytime cannot take the others because makes her sleepy 9. NEUROLOGIC SYMPTOMS: "Do you have any weakness, numbness, or problems with bowel/bladder control?"    Leaking urine during day past 2-3 weeks. 10. OTHER SYMPTOMS: "Do you have any other symptoms?" (e.g., fever, abdominal pain, burning with urination, blood in urine)       no 11. PREGNANCY: "Is there any chance you are pregnant?" (e.g., yes, no; LMP)       LMP: October  Protocols used: Back Pain-A-AH

## 2021-07-04 ENCOUNTER — Encounter: Payer: Self-pay | Admitting: Family Medicine

## 2021-07-04 ENCOUNTER — Ambulatory Visit: Payer: 59 | Attending: Family Medicine | Admitting: Family Medicine

## 2021-07-04 ENCOUNTER — Telehealth: Payer: Self-pay | Admitting: Family Medicine

## 2021-07-04 ENCOUNTER — Other Ambulatory Visit: Payer: Self-pay

## 2021-07-04 VITALS — BP 140/80 | HR 97 | Ht 68.0 in | Wt 208.2 lb

## 2021-07-04 DIAGNOSIS — M25512 Pain in left shoulder: Secondary | ICD-10-CM | POA: Diagnosis not present

## 2021-07-04 DIAGNOSIS — E7800 Pure hypercholesterolemia, unspecified: Secondary | ICD-10-CM

## 2021-07-04 DIAGNOSIS — Z636 Dependent relative needing care at home: Secondary | ICD-10-CM

## 2021-07-04 DIAGNOSIS — E038 Other specified hypothyroidism: Secondary | ICD-10-CM | POA: Diagnosis not present

## 2021-07-04 DIAGNOSIS — Z79890 Hormone replacement therapy (postmenopausal): Secondary | ICD-10-CM | POA: Diagnosis not present

## 2021-07-04 DIAGNOSIS — Z79899 Other long term (current) drug therapy: Secondary | ICD-10-CM | POA: Diagnosis not present

## 2021-07-04 DIAGNOSIS — M5441 Lumbago with sciatica, right side: Secondary | ICD-10-CM

## 2021-07-04 DIAGNOSIS — M5442 Lumbago with sciatica, left side: Secondary | ICD-10-CM

## 2021-07-04 DIAGNOSIS — G8929 Other chronic pain: Secondary | ICD-10-CM | POA: Diagnosis not present

## 2021-07-04 DIAGNOSIS — R0789 Other chest pain: Secondary | ICD-10-CM

## 2021-07-04 DIAGNOSIS — I10 Essential (primary) hypertension: Secondary | ICD-10-CM

## 2021-07-04 DIAGNOSIS — F439 Reaction to severe stress, unspecified: Secondary | ICD-10-CM | POA: Diagnosis not present

## 2021-07-04 MED ORDER — KETOROLAC TROMETHAMINE 60 MG/2ML IM SOLN
60.0000 mg | Freq: Once | INTRAMUSCULAR | Status: AC
Start: 2021-07-04 — End: 2021-07-04
  Administered 2021-07-04: 60 mg via INTRAMUSCULAR

## 2021-07-04 MED ORDER — TIZANIDINE HCL 4 MG PO TABS: 4.0000 mg | ORAL_TABLET | Freq: Two times a day (BID) | ORAL | 6 refills | Status: DC

## 2021-07-04 MED ORDER — HYDROXYZINE HCL 25 MG PO TABS: ORAL_TABLET | ORAL | 6 refills | Status: DC

## 2021-07-04 MED ORDER — ATORVASTATIN CALCIUM 20 MG PO TABS: 20.0000 mg | ORAL_TABLET | Freq: Every day | ORAL | 1 refills | Status: DC

## 2021-07-04 MED ORDER — DULOXETINE HCL 60 MG PO CPEP: 60.0000 mg | ORAL_CAPSULE | Freq: Every day | ORAL | 3 refills | Status: DC

## 2021-07-04 MED ORDER — GABAPENTIN 300 MG PO CAPS: 600.0000 mg | ORAL_CAPSULE | Freq: Every day | ORAL | 1 refills | Status: DC

## 2021-07-04 MED ORDER — AMLODIPINE BESYLATE 10 MG PO TABS: 10.0000 mg | ORAL_TABLET | Freq: Every day | ORAL | 1 refills | Status: DC

## 2021-07-04 MED ORDER — PREDNISONE 20 MG PO TABS: 20.0000 mg | ORAL_TABLET | Freq: Every day | ORAL | 0 refills | Status: DC

## 2021-07-04 MED ORDER — METOPROLOL TARTRATE 50 MG PO TABS: ORAL_TABLET | ORAL | 1 refills | Status: DC

## 2021-07-04 NOTE — Telephone Encounter (Signed)
Patient with a lot of anxiety and stress presenting with somatic symptoms.  She will benefit from psychotherapy.  Cymbalta initiated.  Thank you

## 2021-07-04 NOTE — Progress Notes (Signed)
Subjective:  Patient ID: Heidi Barrett, female    DOB: [birth year]  Age: 55 y.o. MRN: [id number]  CC: Pain   HPI Heidi Barrett is a 55 y.o. year old female with a history of hypertension, menorrhagia secondary to fibroids, hypothyroidism, degenerative disease of the lumbar spine, low back pain with sciatica .  Interval History: She has pain in her lower back radiating down her LLE. She works for 4 hours straight and does not get to take a break. Her hours are switched often and this has affected her ability to take her medications like gabapentin and tizanidine as they are sedating. She is a Training and development officer at [employer]. She has been having L shoulder pain and chest pain for the last 1 week.  Denies presence of shortness of breath and chest pain is unrelated to activity. She endorses being stressed at work due to her pain and is trying to look for a new job Endorses compliance with her statin and is doing well on levothyroxine for hypothyroidism. Past Medical History:  Diagnosis Date   Anemia    Hypertension    Thyroid disease     Past Surgical History:  Procedure Laterality Date   IR GENERIC HISTORICAL  05/28/2016   IR RADIOLOGIST EVAL & MGMT 05/28/2016 Markus Daft, MD [facility] INTERV RAD   IR RADIOLOGIST EVAL & MGMT  03/06/2017   IR RADIOLOGIST EVAL & MGMT  02/17/2018    Family History  Problem Relation Age of Onset   Heart disease Mother    Stroke Mother    Cancer Father        prostate    Allergies  Allergen Reactions   Hctz [Hydrochlorothiazide] Shortness Of Breath and Other (See Comments)    wheezing   Robaxin [Methocarbamol] Swelling    Sensation of throat swelling/difficulty swallowing after taking this medication   Tramadol Anaphylaxis and Swelling   Hydrocodone Nausea And Vomiting    dizzines   Percocet [Oxycodone-Acetaminophen] Nausea And Vomiting and Other (See Comments)    weakness    Outpatient Medications Prior to Visit  Medication Sig Dispense Refill   calcium  carbonate (CALCIUM 600) 600 MG TABS tablet Take 1 tablet (600 mg total) by mouth daily with breakfast. 60 tablet 3   EPINEPHrine 0.3 mg/0.3 mL IJ SOAJ injection Inject 0.3 mg into the muscle as needed for anaphylaxis. 1 each prn   fluticasone (FLONASE) 50 MCG/ACT nasal spray SHAKE LIQUID AND USE 2 SPRAYS IN EACH NOSTRIL DAILY 16 g 1   ibuprofen (ADVIL) 600 MG tablet TAKE 1 TABLET(600 MG) BY MOUTH EVERY 8 HOURS AS NEEDED 60 tablet 3   levothyroxine (SYNTHROID) 150 MCG tablet TAKE 1 TABLET BY MOUTH DAILY BEFORE BREAKFAST 90 tablet 1   Multiple Vitamin (MULTIVITAMIN WITH MINERALS) TABS tablet Take 1 tablet by mouth daily. 90 tablet 3   amLODipine (NORVASC) 10 MG tablet Take 1 tablet (10 mg total) by mouth daily. for blood pressure 90 tablet 1   atorvastatin (LIPITOR) 20 MG tablet TAKE 1 TABLET(20 MG) BY MOUTH DAILY 90 tablet 0   gabapentin (NEURONTIN) 300 MG capsule Take 2 capsules (600 mg total) by mouth at bedtime. 60 capsule 6   hydrOXYzine (ATARAX/VISTARIL) 25 MG tablet TAKE 1 TABLET(25 MG) BY MOUTH AT BEDTIME 30 tablet 6   metoprolol tartrate (LOPRESSOR) 50 MG tablet TAKE 1 TABLET(50 MG) BY MOUTH TWICE DAILY 180 tablet 1   tiZANidine (ZANAFLEX) 4 MG tablet Take 1 tablet (4 mg total) by mouth 2 (two) times  daily. 60 tablet 6   acetaminophen (TYLENOL) 325 MG tablet Take 650 mg by mouth every 6 (six) hours as needed for mild pain. (Patient not taking: Reported on 07/04/2021)     albuterol (PROVENTIL HFA) 108 (90 Base) MCG/ACT inhaler Inhale 2 puffs into the lungs every 4 (four) hours as needed for wheezing or shortness of breath. (Patient not taking: Reported on 07/04/2021) 6.7 g 2   amoxicillin (AMOXIL) 500 MG capsule Take 1 capsule (500 mg total) by mouth 3 (three) times daily. (Patient not taking: Reported on 07/04/2021) 30 capsule 0   cholecalciferol (VITAMIN D) 1000 UNITS tablet Take 1 tablet (1,000 Units total) by mouth daily. (Patient not taking: Reported on 07/04/2021) 90 tablet 3   No  facility-administered medications prior to visit.     ROS Review of Systems  Constitutional:  Negative for activity change, appetite change and fatigue.  HENT:  Negative for congestion, sinus pressure and sore throat.   Eyes:  Negative for visual disturbance.  Respiratory:  Negative for cough, chest tightness, shortness of breath and wheezing.   Cardiovascular:  Negative for chest pain and palpitations.  Gastrointestinal:  Negative for abdominal distention, abdominal pain and constipation.  Endocrine: Negative for polydipsia.  Genitourinary:  Negative for dysuria and frequency.  Musculoskeletal:        See HPI  Skin:  Negative for rash.  Neurological:  Negative for tremors, light-headedness and numbness.  Hematological:  Does not bruise/bleed easily.  Psychiatric/Behavioral:  Negative for agitation and behavioral problems.    Objective:  BP 140/80   Pulse 97   Ht 5\' 8"  (1.727 m)   Wt 208 lb 3.2 oz (94.4 kg)   SpO2 100%   BMI 31.66 kg/m   BP/Weight 07/04/2021 10/26/2020 7/41/2878  Systolic BP 676 720 947  Diastolic BP 80 99 93  Wt. (Lbs) 208.2 214.6 217.6  BMI 31.66 32.63 33.09      Physical Exam Constitutional:      Appearance: She is well-developed.  Cardiovascular:     Rate and Rhythm: Normal rate.     Heart sounds: Normal heart sounds. No murmur heard. Pulmonary:     Effort: Pulmonary effort is normal.     Breath sounds: Normal breath sounds. No wheezing or rales.  Chest:     Chest wall: No tenderness.  Abdominal:     General: Bowel sounds are normal. There is no distension.     Palpations: Abdomen is soft. There is no mass.     Tenderness: There is no abdominal tenderness.  Musculoskeletal:        General: Normal range of motion.     Right lower leg: No edema.     Left lower leg: No edema.     Comments: Tenderness to palpation of anterior and superior aspect of left shoulder joint Negative Hawkins and Neer signs Normal handgrip bilaterally Tenderness to  palpation across lower lumbar spine; positive straight leg raise on the left  Neurological:     Mental Status: She is alert and oriented to person, place, and time.  Psychiatric:        Mood and Affect: Mood normal.    CMP Latest Ref Rng & Units 10/26/2020 01/14/2020 04/29/2019  Glucose 65 - 99 mg/dL 75 81 76  BUN 6 - 24 mg/dL 13 13 12   Creatinine 0.57 - 1.00 mg/dL 0.90 1.01(H) 1.01(H)  Sodium 134 - 144 mmol/L 141 139 141  Potassium 3.5 - 5.2 mmol/L 3.8 4.0 3.7  Chloride 96 -  106 mmol/L 103 102 104  CO2 20 - 29 mmol/L 24 25 26   Calcium 8.7 - 10.2 mg/dL 9.2 9.4 9.2  Total Protein 6.0 - 8.5 g/dL 6.7 6.9 6.5  Total Bilirubin 0.0 - 1.2 mg/dL 0.6 0.5 0.5  Alkaline Phos 44 - 121 IU/L 49 52 53  AST 0 - 40 IU/L 21 13 14   ALT 0 - 32 IU/L 23 15 16     Lipid Panel     Component Value Date/Time   CHOL 126 10/26/2020 0957   TRIG 64 10/26/2020 0957   HDL 53 10/26/2020 0957   CHOLHDL 2.4 10/26/2020 0957   CHOLHDL 2.6 01/16/2015 1041   VLDL 9 01/16/2015 1041   LDLCALC 59 10/26/2020 0957    CBC    Component Value Date/Time   WBC 5.4 12/02/2017 1152   RBC 3.81 (L) 12/02/2017 1152   HGB 11.6 (L) 12/02/2017 1152   HGB 13.3 10/13/2017 1346   HCT 34.8 (L) 12/02/2017 1152   HCT 38.5 10/13/2017 1346   PLT 159 12/02/2017 1152   PLT 167 10/13/2017 1346   MCV 91.3 12/02/2017 1152   MCV 90 10/13/2017 1346   MCH 30.4 12/02/2017 1152   MCHC 33.3 12/02/2017 1152   RDW 12.7 12/02/2017 1152   RDW 13.6 10/13/2017 1346   LYMPHSABS 1.2 12/02/2017 1152   LYMPHSABS 1.1 05/07/2017 1120   MONOABS 0.3 12/02/2017 1152   EOSABS 0.3 12/02/2017 1152   EOSABS 0.2 05/07/2017 1120   BASOSABS 0.0 12/02/2017 1152   BASOSABS 0.0 05/07/2017 1120    Lab Results  Component Value Date   HGBA1C 4.5 05/31/2014    Assessment & Plan:  1. Essential hypertension, benign Controlled Counseled on blood pressure goal of less than 130/80, low-sodium, DASH diet, medication compliance, 150 minutes of moderate intensity  exercise per week. Discussed medication compliance, adverse effects. - amLODipine (NORVASC) 10 MG tablet; Take 1 tablet (10 mg total) by mouth daily. for blood pressure  Dispense: 90 tablet; Refill: 1 - metoprolol tartrate (LOPRESSOR) 50 MG tablet; TAKE 1 TABLET(50 MG) BY MOUTH TWICE DAILY  Dispense: 180 tablet; Refill: 1  2. Hypercholesterolemia Controlled Low-cholesterol diet - atorvastatin (LIPITOR) 20 MG tablet; Take 1 tablet (20 mg total) by mouth daily.  Dispense: 90 tablet; Refill: 1  3. Chronic midline low back pain with bilateral sciatica Uncontrolled due to the fact that she is not able to take her medications which are sedating due to fluctuating schedules at work Will place on Cymbalta which has no sedating adverse effect - gabapentin (NEURONTIN) 300 MG capsule; Take 2 capsules (600 mg total) by mouth at bedtime.  Dispense: 180 capsule; Refill: 1 - tiZANidine (ZANAFLEX) 4 MG tablet; Take 1 tablet (4 mg total) by mouth 2 (two) times daily.  Dispense: 60 tablet; Refill: 6 - DULoxetine (CYMBALTA) 60 MG capsule; Take 1 capsule (60 mg total) by mouth daily. For chronic pain  Dispense: 30 capsule; Refill: 3 - ketorolac (TORADOL) injection 60 mg  4. Caregiver stress She does have some anxiety going on She will benefit from counseling and I have referred her to LCSW for psychotherapy - hydrOXYzine (ATARAX) 25 MG tablet; TAKE 1 TABLET(25 MG) BY MOUTH AT BEDTIME  Dispense: 30 tablet; Refill: 6  5. Atypical chest pain EKG is reassuring Anxiety possibly playing a role  6. Acute pain of left shoulder - predniSONE (DELTASONE) 20 MG tablet; Take 1 tablet (20 mg total) by mouth daily with breakfast.  Dispense: 5 tablet; Refill: 0  7. Other  specified hypothyroidism Controlled - T4, free - TSH    Meds ordered this encounter  Medications   DISCONTD: DULoxetine (CYMBALTA) 60 MG capsule    Sig: Take 1 capsule (60 mg total) by mouth daily.    Dispense:  30 capsule    Refill:  3    amLODipine (NORVASC) 10 MG tablet    Sig: Take 1 tablet (10 mg total) by mouth daily. for blood pressure    Dispense:  90 tablet    Refill:  1   atorvastatin (LIPITOR) 20 MG tablet    Sig: Take 1 tablet (20 mg total) by mouth daily.    Dispense:  90 tablet    Refill:  1   gabapentin (NEURONTIN) 300 MG capsule    Sig: Take 2 capsules (600 mg total) by mouth at bedtime.    Dispense:  180 capsule    Refill:  1   metoprolol tartrate (LOPRESSOR) 50 MG tablet    Sig: TAKE 1 TABLET(50 MG) BY MOUTH TWICE DAILY    Dispense:  180 tablet    Refill:  1   tiZANidine (ZANAFLEX) 4 MG tablet    Sig: Take 1 tablet (4 mg total) by mouth 2 (two) times daily.    Dispense:  60 tablet    Refill:  6   hydrOXYzine (ATARAX) 25 MG tablet    Sig: TAKE 1 TABLET(25 MG) BY MOUTH AT BEDTIME    Dispense:  30 tablet    Refill:  6   DULoxetine (CYMBALTA) 60 MG capsule    Sig: Take 1 capsule (60 mg total) by mouth daily. For chronic pain    Dispense:  30 capsule    Refill:  3   predniSONE (DELTASONE) 20 MG tablet    Sig: Take 1 tablet (20 mg total) by mouth daily with breakfast.    Dispense:  5 tablet    Refill:  0   ketorolac (TORADOL) injection 60 mg     Follow-up: Return in about 6 months (around 01/01/2022) for Chronic medical conditions.       Charlott Rakes, MD, FAAFP. [facility] and [place], [place]   07/04/2021, 1:20 PM

## 2021-07-05 ENCOUNTER — Other Ambulatory Visit: Payer: Self-pay | Admitting: Family Medicine

## 2021-07-05 DIAGNOSIS — E038 Other specified hypothyroidism: Secondary | ICD-10-CM

## 2021-07-05 LAB — T4, FREE: Free T4: 1.9 ng/dL — ABNORMAL HIGH (ref 0.82–1.77)

## 2021-07-05 LAB — TSH: TSH: 0.461 u[IU]/mL (ref 0.450–4.500)

## 2021-07-05 MED ORDER — LEVOTHYROXINE SODIUM 150 MCG PO TABS: ORAL_TABLET | ORAL | 1 refills | Status: DC

## 2021-07-08 ENCOUNTER — Other Ambulatory Visit: Payer: Self-pay | Admitting: Family Medicine

## 2021-07-08 DIAGNOSIS — E038 Other specified hypothyroidism: Secondary | ICD-10-CM

## 2021-07-08 NOTE — Telephone Encounter (Signed)
last RF 07/05/21 #90 1 RF (pharmacy closed) Requested Prescriptions  Refused Prescriptions Disp Refills  . levothyroxine (SYNTHROID) 150 MCG tablet [Pharmacy Med Name: LEVOTHYROXINE 0.150MG  (150MCG) TAB] 90 tablet 1    Sig: TAKE 1 TABLET BY MOUTH DAILY BEFORE AND BREAKFAST     Endocrinology:  Hypothyroid Agents Failed - 07/08/2021  3:08 AM      Failed - TSH needs to be rechecked within 3 months after an abnormal result. Refill until TSH is due.      Passed - TSH in normal range and within 360 days    TSH  Date Value Ref Range Status  07/04/2021 0.461 0.450 - 4.500 uIU/mL Final         Passed - Valid encounter within last 12 months    Recent Outpatient Visits          4 days ago Atypical chest pain   [place], [name], MD   5 months ago Skin lesions   [place], [name], MD   8 months ago [place], [name], MD   10 months ago Other insomnia   [place], Charlane Ferretti, MD   10 months ago Chronic midline low back pain with bilateral sciatica   [place], MD      Future Appointments            In 1 month [place], [place], [place] [facility], [facility]   In 5 months Charlott Rakes, MD [place]

## 2021-08-14 NOTE — Telephone Encounter (Signed)
Spoke with pt and scheduled appt for 08/29/21 at 3:00

## 2021-08-15 ENCOUNTER — Ambulatory Visit: Payer: 59 | Admitting: Physician Assistant

## 2021-08-29 ENCOUNTER — Other Ambulatory Visit: Payer: Self-pay

## 2021-08-29 ENCOUNTER — Ambulatory Visit: Payer: Self-pay | Attending: Family Medicine | Admitting: Clinical

## 2021-08-29 DIAGNOSIS — F4323 Adjustment disorder with mixed anxiety and depressed mood: Secondary | ICD-10-CM

## 2021-08-29 NOTE — Progress Notes (Signed)
Brother died, sister died, a year ago. Father died from cancer

## 2021-09-05 NOTE — BH Specialist Note (Signed)
Integrated Behavioral Health via Telemedicine Visit  08/29/2021 Heidi Barrett [id number]  Number of [place] visits: 1 Session Start time: 3:15pm  Session End time: 4:15pm Total time: 83  Referring Provider: Charlott Rakes, MD Patient/Family location: Home [facility] Provider location: Home All persons participating in visit: Pt and LCSWA Types of Service: Individual psychotherapy and Telephone visit  I connected with Heidi Barrett via Telephone  and verified that I am speaking with the correct person using two identifiers. Discussed confidentiality: Yes   I discussed the limitations of telemedicine and the availability of in person appointments.  Discussed there is a possibility of technology failure and discussed alternative modes of communication if that failure occurs.  I discussed that engaging in this telemedicine visit, they consent to the provision of behavioral healthcare and the services will be billed under their insurance.  Patient and/or legal guardian expressed understanding and consented to Telemedicine visit: Yes   Presenting Concerns: Patient and/or family reports the following symptoms/concerns: Reports feeling depressed at times, trouble sleeping, decreased appetite, decreased energy, trouble concentrating, anxiousness, excessive worrying, trouble relaxing, restlessness, and irritability. Reports feeling stressed with caring for her mother. Reports that her brother and sister died a year ago and she often misses them.  Duration of problem: 1 year; Severity of problem: moderate  Patient and/or Family's Strengths/Protective Factors: Sense of purpose  Goals Addressed: Patient will:  Reduce symptoms of: stress   Increase knowledge and/or ability of: coping skills   Demonstrate ability to: Increase healthy adjustment to current life circumstances  Progress towards Goals: Ongoing  Interventions: Interventions utilized:  Mindfulness or Surveyor, mining, CBT Cognitive Behavioral Therapy, and Supportive Counseling Standardized Assessments completed: C-SSRS Short, GAD-7, and PHQ 9 GAD 7 : Generalized Anxiety Score 08/29/2021 07/04/2021 10/26/2020 06/28/2019  Nervous, Anxious, on Edge 3 1 0 0  Control/stop worrying 3 1 0 0  Worry too much - different things 3 1 0 0  Trouble relaxing 3 0 0 0  Restless 3 0 0 0  Easily annoyed or irritable 1 0 0 0  Afraid - awful might happen 3 1 0 0  Total GAD 7 Score 19 4 0 0  Anxiety Difficulty - - Not difficult at all -     Depression screen [facility] 2/9 08/29/2021 07/04/2021 10/26/2020 06/28/2019 10/07/2018  Decreased Interest 1 0 0 0 0  Down, Depressed, Hopeless 1 0 0 0 0  PHQ - 2 Score 2 0 0 0 0  Altered sleeping 1 1 1  0 0  Tired, decreased energy 3 1 0 0 1  Change in appetite 3 0 1 1 1   Feeling bad or failure about yourself  1 0 0 0 0  Trouble concentrating 1 0 0 0 0  Moving slowly or fidgety/restless 1 0 0 0 0  Suicidal thoughts 0 0 0 0 0  PHQ-9 Score 12 2 2 1 2   Difficult doing work/chores - - Not difficult at all - -  Some recent data might be hidden   [place] from 08/29/2021 in [place]       Patient and/or Family Response: Pt receptive to tx. Pt receptive to psychoeducation provided on stress, depression, and anxiety. Pt receptive to cognitive restructuring to decrease negative thoughts. Pt receptive to utilizing deep breathing exercises and incorporating daily self-care.  Assessment: Endorses thoughts of being better off dead at times however denies SI. Endorses protective factor as  mother. Pt provided with crisis resources and verbal safety plan completed. Patient currently experiencing stress related to caring for her mother and missing her siblings. Pt appears to have limited time alone.  Patient may benefit from brief interventions. LCSWA provided psychoeducation on stress, depression,  and anxiety. LCSWA utilized cognitive restructuring to decrease negative thoughts. LCSWA encouraged pt to utilize deep breathing exercises and incorporate daily self-care. LCSWA will fu with pt.  Plan: Follow up with behavioral health clinician on : 09/19/21 Behavioral recommendations: Utilize deep breathing exercises and daily self-care. Utilize provided crisis resources if SI arises with plan, means, and intent. Referral(s): [place] (In Clinic)  I discussed the assessment and treatment plan with the patient and/or parent/guardian. They were provided an opportunity to ask questions and all were answered. They agreed with the plan and demonstrated an understanding of the instructions.   They were advised to call back or seek an in-person evaluation if the symptoms worsen or if the condition fails to improve as anticipated.  Gavina Dildine C Dionicio Shelnutt, LCSW

## 2021-09-19 ENCOUNTER — Ambulatory Visit: Payer: Medicaid Other | Attending: Family Medicine | Admitting: Clinical

## 2021-09-19 DIAGNOSIS — F4323 Adjustment disorder with mixed anxiety and depressed mood: Secondary | ICD-10-CM

## 2021-09-19 NOTE — BH Specialist Note (Signed)
Integrated Behavioral Health via Telemedicine Visit  09/20/2021 Heidi Barrett [id number]  Number of [place] Clinician visits: 2- Second Visit  Session Start time: 4492   Session End time: 1113  Total time in minutes: 30   Referring Provider: Charlott Rakes, MD Patient/Family location: Home [facility] Provider location: [facility] office All persons participating in visit: Pt and LCSW Types of Service: Individual psychotherapy  I connected with Heidi Barrett via Telephone and verified that I am speaking with the correct person using two identifiers. Discussed confidentiality: Yes   I discussed the limitations of telemedicine and the availability of in person appointments.  Discussed there is a possibility of technology failure and discussed alternative modes of communication if that failure occurs.  I discussed that engaging in this telemedicine visit, they consent to the provision of behavioral healthcare and the services will be billed under their insurance.  Patient and/or legal guardian expressed understanding and consented to Telemedicine visit: Yes   Presenting Concerns: Patient and/or family reports the following symptoms/concerns: Reports feeling depressed, decreased energy, anxiousness, excessive worrying, restlessness, and irritability. Reports that she had another family member die. Reports being tired of attending funerals and also at times not being close to the family members who die but still attending the funerals. Reports that her family constantly needs her and she is overwhelmed.  Duration of problem: 1 year; Severity of problem: moderate  Patient and/or Family's Strengths/Protective Factors: Sense of purpose  Goals Addressed: Patient will:  Reduce symptoms of: stress   Increase knowledge and/or ability of: coping skills   Demonstrate ability to: Increase healthy adjustment to current life circumstances  Progress towards  Goals: Ongoing  Interventions: Interventions utilized:  Mindfulness or Psychologist, educational, CBT Cognitive Behavioral Therapy, and Supportive Counseling Standardized Assessments completed: Not Needed  Patient and/or Family Response: Pt receptive to tx. Pt receptive to psychoeducation provided on grief. Pt receptive to cognitive restructuring and assistance with cognitive processing skills. Pt receptive to establishing healthy boundaries with family. Pt receptive to continuing deep breathing exercises.  Assessment: Denies SI/HI. Patient currently experiencing anxiety and depression. Pt appears to be overwhelmed with helping family and experiencing multiple deaths in her family. Pt appears to have difficulty with establishing boundaries.   Patient may benefit from brief interventions. LCSW provided psychoeducation on grief. LCSW utilized cognitive restructuring and assist with cognitive processing skills. LCSW encouraged pt to establish healthy boundaries with family. LCSW encouraged pt to utilize deep breathing exercises.  Plan: Follow up with behavioral health clinician on : 10/10/21 Behavioral recommendations: Utilize deep breathing exercises and establish healthy boundaries.  Referral(s): [place] (In Clinic)  I discussed the assessment and treatment plan with the patient and/or parent/guardian. They were provided an opportunity to ask questions and all were answered. They agreed with the plan and demonstrated an understanding of the instructions.   They were advised to call back or seek an in-person evaluation if the symptoms worsen or if the condition fails to improve as anticipated.  Dimitris Shanahan C Raife Lizer, LCSW

## 2021-09-29 ENCOUNTER — Other Ambulatory Visit: Payer: Self-pay | Admitting: Family Medicine

## 2021-09-29 DIAGNOSIS — I10 Essential (primary) hypertension: Secondary | ICD-10-CM

## 2021-10-01 ENCOUNTER — Telehealth: Payer: Self-pay | Admitting: *Deleted

## 2021-10-01 NOTE — Telephone Encounter (Signed)
[facility] does not show refills even though looks like has one on our med list. Pharmacist thought insurance had changed coverage. Called pt to clarify.

## 2021-10-01 NOTE — Telephone Encounter (Signed)
Requested Prescriptions  Pending Prescriptions Disp Refills   metoprolol tartrate (LOPRESSOR) 50 MG tablet [Pharmacy Med Name: METOPROLOL TARTRATE 50MG  TABLETS] 180 tablet 0    Sig: TAKE 1 TABLET(50 MG) BY MOUTH TWICE DAILY     Cardiovascular:  Beta Blockers Failed - 09/29/2021  3:07 AM      Failed - Last BP in normal range    BP Readings from Last 1 Encounters:  07/04/21 140/80         Passed - Last Heart Rate in normal range    Pulse Readings from Last 1 Encounters:  07/04/21 97         Passed - Valid encounter within last 6 months    Recent Outpatient Visits          2 months ago Atypical chest pain   [place], Charlane Ferretti, MD   8 months ago Skin lesions   [place], Charlane Ferretti, MD   11 months ago [place], Charlane Ferretti, MD   1 year ago Other insomnia   [place], Minto, MD   1 year ago Chronic midline low back pain with bilateral sciatica   [place], Charlane Ferretti, MD      Future Appointments            In 3 months Charlott Rakes, MD [place]   In 4 months [place], [place], [place] [facility], [facility]            amLODipine (NORVASC) 10 MG tablet [Pharmacy Med Name: AMLODIPINE BESYLATE 10MG  TABLETS] 90 tablet 0    Sig: TAKE 1 TABLET(10 MG) BY MOUTH DAILY FOR BLOOD PRESSURE     Cardiovascular: Calcium Channel Blockers 2 Failed - 09/29/2021  3:07 AM      Failed - Last BP in normal range    BP Readings from Last 1 Encounters:  07/04/21 140/80         Passed - Last Heart Rate in normal range    Pulse Readings from Last 1 Encounters:  07/04/21 97         Passed - Valid encounter within last 6 months    Recent Outpatient Visits          2 months ago Atypical chest pain   [place], [name], MD   8 months ago Skin lesions   [place], [name], MD   11 months ago [place], [name], MD   1 year ago Other insomnia   [place], Charlane Ferretti, MD   1 year ago Chronic midline low back pain with bilateral sciatica   [place], [name], MD      Future Appointments            In 3 months Charlott Rakes, MD [place]   In 4 months [place], [place], [place] [facility], [facility]

## 2021-10-10 ENCOUNTER — Ambulatory Visit: Payer: Self-pay

## 2021-10-10 ENCOUNTER — Other Ambulatory Visit: Payer: Self-pay

## 2021-10-10 ENCOUNTER — Ambulatory Visit: Payer: Medicaid Other | Attending: Family Medicine | Admitting: Clinical

## 2021-10-10 ENCOUNTER — Telehealth: Payer: Self-pay | Admitting: Clinical

## 2021-10-10 DIAGNOSIS — R928 Other abnormal and inconclusive findings on diagnostic imaging of breast: Secondary | ICD-10-CM

## 2021-10-10 DIAGNOSIS — E7800 Pure hypercholesterolemia, unspecified: Secondary | ICD-10-CM

## 2021-10-10 DIAGNOSIS — F331 Major depressive disorder, recurrent, moderate: Secondary | ICD-10-CM

## 2021-10-10 DIAGNOSIS — G8929 Other chronic pain: Secondary | ICD-10-CM

## 2021-10-10 DIAGNOSIS — Z636 Dependent relative needing care at home: Secondary | ICD-10-CM

## 2021-10-10 DIAGNOSIS — E038 Other specified hypothyroidism: Secondary | ICD-10-CM

## 2021-10-10 MED ORDER — LEVOTHYROXINE SODIUM 150 MCG PO TABS: ORAL_TABLET | ORAL | 1 refills | Status: DC

## 2021-10-10 MED ORDER — ATORVASTATIN CALCIUM 20 MG PO TABS: 20.0000 mg | ORAL_TABLET | Freq: Every day | ORAL | 1 refills | Status: DC

## 2021-10-10 MED ORDER — DULOXETINE HCL 60 MG PO CPEP: 60.0000 mg | ORAL_CAPSULE | Freq: Every day | ORAL | 3 refills | Status: DC

## 2021-10-10 MED ORDER — HYDROXYZINE HCL 25 MG PO TABS: ORAL_TABLET | ORAL | 6 refills | Status: DC

## 2021-10-10 NOTE — Telephone Encounter (Signed)
Third attempt to reach pt. Left message to call back. 

## 2021-10-10 NOTE — Telephone Encounter (Signed)
I have refilled her medications.  Please inform her she was due to have a repeat mammogram due to abnormal left breast findings.  I have ordered diagnostic mammogram and she needs to call the breast center to schedule this ASAP.  Thanks. ?

## 2021-10-10 NOTE — Telephone Encounter (Signed)
This pt is requesting a refill on her atorvastatin, duloxetine, hydroxyzine, and levothyroxine.  ?

## 2021-10-10 NOTE — Telephone Encounter (Signed)
Patient experiencing right knee pain and lower back pain. No available appointments seeking clinical advice   ?  ?           Left message to call back about symptoms. ?

## 2021-10-10 NOTE — Telephone Encounter (Signed)
Second attempt to reach pt. Left message to call back. 

## 2021-10-11 NOTE — BH Specialist Note (Signed)
Integrated Behavioral Health via Telemedicine Visit ? ?10/10/2021 ?TYIANA HILL ?[id number] ? ?Number of [place] Clinician visits: 3- Third Visit ? ?Session Start time: 1696 ?  ?Session End time: 1110 ? ?Total time in minutes: 30 ? ? ?Referring Provider: Charlott Rakes, MD ?Patient/Family location: Home ?[facility] Provider location: [facility] office ?All persons participating in visit: Pt and LCSW ?Types of Service: Individual psychotherapy ? ?I connected with [name] via Telephone and verified that I am speaking with the correct person using two identifiers. Discussed confidentiality: Yes  ? ?I discussed the limitations of telemedicine and the availability of in person appointments.  Discussed there is a possibility of technology failure and discussed alternative modes of communication if that failure occurs. ? ?I discussed that engaging in this telemedicine visit, they consent to the provision of behavioral healthcare and the services will be billed under their insurance. ? ?Patient and/or legal guardian expressed understanding and consented to Telemedicine visit: Yes  ? ?Presenting Concerns: ?Patient and/or family reports the following symptoms/concerns: Reports that her energy has been low. Reports that she worries about her physical health. Reports that she worries a lot about her family and has difficulty not internalizing their problems. ?Duration of problem: 1 year; Severity of problem: moderate ? ?Patient and/or Family's Strengths/Protective Factors: ?Sense of purpose ? ?Goals Addressed: ?Patient will: ? Reduce symptoms of: depression and stress  ? Increase knowledge and/or ability of: coping skills  ? Demonstrate ability to: Increase healthy adjustment to current life circumstances ? ?Progress towards Goals: ?Ongoing ? ?Interventions: ?Interventions utilized:  Optician, dispensing, CBT Cognitive Behavioral Therapy, and Supportive Counseling ?Standardized Assessments  completed: Not Needed ? ?Patient and/or Family Response: Pt receptive to tx. Pt receptive to psychoeducation on the association with depression and physical health. Pt receptive to cognitive restructuring. Pt receptive to establishing healthy boundaries with family. Pt receptive to continuing deep breathing exercises. ? ?Assessment: ?Denies SI/HI. Patient currently experiencing depression. Pt has experienced frequent deaths in her family. Pt has difficulty with boundaries in her family as she frequently takes on others problems.  ? ?Patient may benefit from brief interventions. LCSW provided psychoeducation on the association of depression and physical health. LCSW utilized Adult nurse. LCSW provided assistance with pt establishing boundaries with family. LCSW encouraged pt to utilize deep breathing exercises. ? ?Plan: ?Follow up with behavioral health clinician on : 11/07/21 ?Behavioral recommendations: Utilize deep breathing exercises and establish healthy boundaries with family. ?Referral(s): [place] (In Clinic) ? ?I discussed the assessment and treatment plan with the patient and/or parent/guardian. They were provided an opportunity to ask questions and all were answered. They agreed with the plan and demonstrated an understanding of the instructions. ?  ?They were advised to call back or seek an in-person evaluation if the symptoms worsen or if the condition fails to improve as anticipated. ? ?Klaira Pesci C Madilynne Mullan, LCSW ?

## 2021-10-11 NOTE — Telephone Encounter (Signed)
Called pt unable to reach left VM to call back. ?

## 2021-11-05 ENCOUNTER — Telehealth: Payer: Self-pay | Admitting: Family Medicine

## 2021-11-05 NOTE — Telephone Encounter (Signed)
Copied from [place] [id number]. Topic: General - Other >> Nov 01, 2021 11:54 AM Antonieta Iba C wrote: Reason for CRM: pt says that she was told by PCP to have further tested on her breast. Pt would like to provide the name and phone of a location that her insurance will cover.   Outpatient [facility]  Phone: [phone]

## 2021-11-06 NOTE — Telephone Encounter (Signed)
All placed to patient and VM was left informing patient to return phone call with insurance information. ?

## 2021-11-07 ENCOUNTER — Encounter: Payer: Medicaid Other | Admitting: Clinical

## 2021-11-13 ENCOUNTER — Ambulatory Visit: Payer: Self-pay

## 2021-11-13 ENCOUNTER — Emergency Department (HOSPITAL_COMMUNITY)
Admission: EM | Admit: 2021-11-13 | Discharge: 2021-11-14 | Disposition: A | Discharge status: Home / Self Care | Payer: Self-pay | Attending: Emergency Medicine | Admitting: Emergency Medicine

## 2021-11-13 ENCOUNTER — Emergency Department (HOSPITAL_COMMUNITY): Payer: Self-pay

## 2021-11-13 ENCOUNTER — Encounter (HOSPITAL_COMMUNITY): Payer: Self-pay | Admitting: Emergency Medicine

## 2021-11-13 DIAGNOSIS — D219 Benign neoplasm of connective and other soft tissue, unspecified: Secondary | ICD-10-CM

## 2021-11-13 DIAGNOSIS — R1031 Right lower quadrant pain: Secondary | ICD-10-CM | POA: Insufficient documentation

## 2021-11-13 DIAGNOSIS — N83202 Unspecified ovarian cyst, left side: Secondary | ICD-10-CM

## 2021-11-13 DIAGNOSIS — I10 Essential (primary) hypertension: Secondary | ICD-10-CM | POA: Insufficient documentation

## 2021-11-13 DIAGNOSIS — Z79899 Other long term (current) drug therapy: Secondary | ICD-10-CM | POA: Insufficient documentation

## 2021-11-13 LAB — CBC WITH DIFFERENTIAL/PLATELET
Abs Immature Granulocytes: 0.01 10*3/uL (ref 0.00–0.07)
Basophils Absolute: 0 10*3/uL (ref 0.0–0.1)
Basophils Relative: 1 %
Eosinophils Absolute: 0.3 10*3/uL (ref 0.0–0.5)
Eosinophils Relative: 8 %
HCT: 38.3 % (ref 36.0–46.0)
Hemoglobin: 13.2 g/dL (ref 12.0–15.0)
Immature Granulocytes: 0 %
Lymphocytes Relative: 33 %
Lymphs Abs: 1.3 10*3/uL (ref 0.7–4.0)
MCH: 31.2 pg (ref 26.0–34.0)
MCHC: 34.5 g/dL (ref 30.0–36.0)
MCV: 90.5 fL (ref 80.0–100.0)
Monocytes Absolute: 0.4 10*3/uL (ref 0.1–1.0)
Monocytes Relative: 11 %
Neutro Abs: 1.8 10*3/uL (ref 1.7–7.7)
Neutrophils Relative %: 47 %
Platelets: 122 10*3/uL — ABNORMAL LOW (ref 150–400)
RBC: 4.23 MIL/uL (ref 3.87–5.11)
RDW: 13.2 % (ref 11.5–15.5)
WBC: 3.9 10*3/uL — ABNORMAL LOW (ref 4.0–10.5)
nRBC: 0 % (ref 0.0–0.2)

## 2021-11-13 LAB — COMPREHENSIVE METABOLIC PANEL
ALT: 32 U/L (ref 0–44)
AST: 25 U/L (ref 15–41)
Albumin: 3.8 g/dL (ref 3.5–5.0)
Alkaline Phosphatase: 48 U/L (ref 38–126)
Anion gap: 4 — ABNORMAL LOW (ref 5–15)
BUN: 17 mg/dL (ref 6–20)
CO2: 29 mmol/L (ref 22–32)
Calcium: 9 mg/dL (ref 8.9–10.3)
Chloride: 108 mmol/L (ref 98–111)
Creatinine, Ser: 0.89 mg/dL (ref 0.44–1.00)
GFR, Estimated: >60 mL/min (ref >60)
Glucose, Bld: 83 mg/dL (ref 70–99)
Potassium: 3.5 mmol/L (ref 3.5–5.1)
Sodium: 141 mmol/L (ref 135–145)
Total Bilirubin: 1 mg/dL (ref 0.3–1.2)
Total Protein: 6.8 g/dL (ref 6.5–8.1)

## 2021-11-13 LAB — URINALYSIS, ROUTINE W REFLEX MICROSCOPIC
Bilirubin Urine: NEGATIVE
Glucose, UA: NEGATIVE mg/dL
Hgb urine dipstick: NEGATIVE
Ketones, ur: NEGATIVE mg/dL
Leukocytes,Ua: NEGATIVE
Nitrite: NEGATIVE
Protein, ur: NEGATIVE mg/dL
Specific Gravity, Urine: >1.046 — ABNORMAL HIGH (ref 1.005–1.030)
pH: 6 (ref 5.0–8.0)

## 2021-11-13 LAB — I-STAT BETA HCG BLOOD, ED (MC, WL, AP ONLY): I-stat hCG, quantitative: <5 m[IU]/mL (ref <5)

## 2021-11-13 LAB — LIPASE, BLOOD: Lipase: 37 U/L (ref 11–51)

## 2021-11-13 MED ORDER — DICYCLOMINE HCL 20 MG PO TABS: 20.0000 mg | ORAL_TABLET | Freq: Two times a day (BID) | ORAL | 0 refills | Status: DC

## 2021-11-13 MED ORDER — IOHEXOL 300 MG/ML  SOLN
100.0000 mL | Freq: Once | INTRAMUSCULAR | Status: AC | PRN
  Administered 2021-11-13: 100 mL via INTRAVENOUS

## 2021-11-13 MED ORDER — HYDROXYZINE HCL 25 MG PO TABS: 25.0000 mg | ORAL_TABLET | Freq: Four times a day (QID) | ORAL | 0 refills | Status: DC

## 2021-11-13 MED ORDER — METOCLOPRAMIDE HCL 10 MG PO TABS: 10.0000 mg | ORAL_TABLET | Freq: Four times a day (QID) | ORAL | 0 refills | Status: DC

## 2021-11-13 NOTE — ED Provider Triage Note (Signed)
Emergency Medicine Provider Triage Evaluation Note ? ?Heidi Barrett , a 56 y.o. female  was evaluated in triage.  Pt complains of right lower quadrant abdominal pain x3 weeks.  Endorses fluctuating pain.  Endorses nausea, diarrhea.  Last menstrual cycle 5 months ago ? ?Review of Systems  ?Positive: Abdominal pain, nausea, diarrhea, spotting ?Negative: Chest pain, shortness of breath ? ?Physical Exam  ?BP (!) 156/96 (BP Location: Left Arm)   Pulse 66   Temp 98.7 ?F (37.1 ?C) (Oral)   Resp 16   SpO2 99%  ?Gen:   Awake, no distress   ?Resp:  Normal effort  ?MSK:   Moves extremities without difficulty  ?Other:  RLQ TTP ? ?Medical Decision Making  ?Medically screening exam initiated at 3:47 PM.  Appropriate orders placed.  Heidi Barrett was informed that the remainder of the evaluation will be completed by another provider, this initial triage assessment does not replace that evaluation, and the importance of remaining in the ED until their evaluation is complete. ? ? ?  ?Dorothyann Peng, PA-C ?11/13/21 1549 ? ?

## 2021-11-13 NOTE — Telephone Encounter (Signed)
? ? ? ?  Chief Complaint: Abdominal pain ?Symptoms: abdominal pain 10/10 - intermittent at first now constant  ?Frequency: 1 week ?Pertinent Negatives: Patient denies fever, sob ?Disposition: '[x]'$ ED /'[]'$ Urgent Care (no appt availability in office) / '[]'$ Appointment(In office/virtual)/ '[]'$  [facility]/ '[]'$ Home Care/ '[]'$ Refused Recommended Disposition /'[]'$ [facility]/ '[]'$  Follow-up with PCP ?Additional Notes: Pt has had abdominal pain for 1 week that started intermittent , but is now constant. Pain radiates to her back and hip. PT has not had a period for 4-5 months but has had some blood tinged clear vaginal discharge. ?Pt has history of fibroids. ? ?Reason for Disposition ? [1] SEVERE pain (e.g., excruciating) AND [2] present > 1 hour ? ?Answer Assessment - Initial Assessment Questions ?1. LOCATION: "Where does it hurt?"  ?    Rt side abdomen ?2. RADIATION: "Does the pain shoot anywhere else?" (e.g., chest, back) ?    Goes to back and hip ?3. ONSET: "When did the pain begin?" (e.g., minutes, hours or days ago)  ?    1 week ?4. SUDDEN: "Gradual or sudden onset?" ?    Gradual ?5. PATTERN "Does the pain come and go, or is it constant?" ?   - If constant: "Is it getting better, staying the same, or worsening?"  ?    (Note: Constant means the pain never goes away completely; most serious pain is constant and it progresses)  ?   - If intermittent: "How long does it last?" "Do you have pain now?" ?    (Note: Intermittent means the pain goes away completely between bouts) ?    Constant - was intermittent at first ?6. SEVERITY: "How bad is the pain?"  (e.g., Scale 1-10; mild, moderate, or severe) ?  - MILD (1-3): doesn't interfere with normal activities, abdomen soft and not tender to touch  ?  - MODERATE (4-7): interferes with normal activities or awakens from sleep, abdomen tender to touch  ?  - SEVERE (8-10): excruciating pain, doubled over, unable to do any normal activities  ?    10/10 ?7. RECURRENT  SYMPTOM: "Have you ever had this type of stomach pain before?" If Yes, ask: "When was the last time?" and "What happened that time?"  ?    no ?8. CAUSE: "What do you think is causing the stomach pain?" ?    Uterus? Ovaries ?9. RELIEVING/AGGRAVATING FACTORS: "What makes it better or worse?" (e.g., movement, antacids, bowel movement) ?    Laying down 10. OTHER SYMPTOMS: "Do you have any other symptoms?" (e.g., back pain, diarrhea, fever, urination pain, vomiting) ?      diarrhea - frquent/ urgent urination ?11. PREGNANCY: "Is there any chance you are pregnant?" "When was your last menstrual period?" ?      4-5 months ago - no ? ?Protocols used: Abdominal Pain - Female-A-AH ? ?

## 2021-11-13 NOTE — ED Triage Notes (Signed)
Per pt, states right lower quadrant pain for a couple of weeks-started having nausea today-states she has not had menstrual cycle in 5 months although started having some spotting today  ?

## 2021-11-14 NOTE — ED Provider Notes (Signed)
?Time DEPT ?Provider Note ? ? ?CSN: [id number] ?Arrival date & time: 11/13/21  1427 ? ?  ? ?History ? ?Chief Complaint  ?Patient presents with  ? Abdominal Pain  ? ? ?Heidi Barrett is a 56 y.o. female. ? ? ?Abdominal Pain ? ?Patient is a 56 year old female with a past medical history history of fibroids, menorrhagia, HTN, thyroid disease, anemia ? ? ?She presented emergency room today with complaints of right lower quadrant abdominal pain seems that has been ongoing for 3 to 4 weeks.  She states that it is constant but waxing and waning however after further questioning it seems that there are large periods of time/occasionally entire days where she has no abdominal pain.  She states that she does have nausea but no emesis.  She has some loose stool she denies any watery stool or blood in stool.  She denies any fevers.  She states she is also had several months of not having a menstrual cycle and then had some BRB PV Over the past week ? ?She states she called her primary care provider who recommended she come to the ER. ? ? ?  ? ?Home Medications ?Prior to Admission medications   ?Medication Sig Start Date End Date Taking? Authorizing Provider  ?dicyclomine (BENTYL) 20 MG tablet Take 1 tablet (20 mg total) by mouth 2 (two) times daily. 11/13/21  Yes Tedd Sias, PA  ?hydrOXYzine (ATARAX) 25 MG tablet Take 1 tablet (25 mg total) by mouth every 6 (six) hours. 11/13/21  Yes Tedd Sias, PA  ?metoCLOPramide (REGLAN) 10 MG tablet Take 1 tablet (10 mg total) by mouth every 6 (six) hours. 11/13/21  Yes Pati Gallo S, PA  ?acetaminophen (TYLENOL) 325 MG tablet Take 650 mg by mouth every 6 (six) hours as needed for mild pain. ?Patient not taking: Reported on 07/04/2021    [provider]  ?albuterol (PROVENTIL HFA) 108 (90 Base) MCG/ACT inhaler Inhale 2 puffs into the lungs every 4 (four) hours as needed for wheezing or shortness of breath. ?Patient not taking: Reported  on 07/04/2021 10/07/18   Charlott Rakes, MD  ?amLODipine (NORVASC) 10 MG tablet TAKE 1 TABLET(10 MG) BY MOUTH DAILY FOR BLOOD PRESSURE 10/01/21   Charlott Rakes, MD  ?amoxicillin (AMOXIL) 500 MG capsule Take 1 capsule (500 mg total) by mouth 3 (three) times daily. ?Patient not taking: Reported on 07/04/2021 10/26/20   Charlott Rakes, MD  ?atorvastatin (LIPITOR) 20 MG tablet Take 1 tablet (20 mg total) by mouth daily. 10/10/21   Charlott Rakes, MD  ?calcium carbonate (CALCIUM 600) 600 MG TABS tablet Take 1 tablet (600 mg total) by mouth daily with breakfast. 05/31/14   [name], MD  ?cholecalciferol (VITAMIN D) 1000 UNITS tablet Take 1 tablet (1,000 Units total) by mouth daily. ?Patient not taking: Reported on 07/04/2021 05/31/14   [name], MD  ?DULoxetine (CYMBALTA) 60 MG capsule Take 1 capsule (60 mg total) by mouth daily. For chronic pain 10/10/21   Charlott Rakes, MD  ?EPINEPHrine 0.3 mg/0.3 mL IJ SOAJ injection Inject 0.3 mg into the muscle as needed for anaphylaxis. 04/25/20   Charlott Rakes, MD  ?fluticasone (FLONASE) 50 MCG/ACT nasal spray SHAKE LIQUID AND USE 2 SPRAYS IN EACH NOSTRIL DAILY 01/18/21   Charlott Rakes, MD  ?gabapentin (NEURONTIN) 300 MG capsule Take 2 capsules (600 mg total) by mouth at bedtime. 07/04/21   Charlott Rakes, MD  ?hydrOXYzine (ATARAX) 25 MG tablet TAKE 1 TABLET(25 MG) BY MOUTH  AT BEDTIME 10/10/21   Charlott Rakes, MD  ?ibuprofen (ADVIL) 600 MG tablet TAKE 1 TABLET(600 MG) BY MOUTH EVERY 8 HOURS AS NEEDED 01/20/21   Charlott Rakes, MD  ?levothyroxine (SYNTHROID) 150 MCG tablet TAKE 1 TABLET BY MOUTH DAILY BEFORE BREAKFAST 10/10/21   Charlott Rakes, MD  ?metoprolol tartrate (LOPRESSOR) 50 MG tablet TAKE 1 TABLET(50 MG) BY MOUTH TWICE DAILY 10/01/21   Charlott Rakes, MD  ?Multiple Vitamin (MULTIVITAMIN WITH MINERALS) TABS tablet Take 1 tablet by mouth daily. 05/31/14   [name], MD  ?predniSONE (DELTASONE) 20 MG tablet Take 1 tablet (20 mg total) by  mouth daily with breakfast. 07/04/21   Charlott Rakes, MD  ?tiZANidine (ZANAFLEX) 4 MG tablet Take 1 tablet (4 mg total) by mouth 2 (two) times daily. 07/04/21   Charlott Rakes, MD  ?   ? ?Allergies    ?Hctz [hydrochlorothiazide], Robaxin [methocarbamol], Tramadol, Hydrocodone, and Percocet [oxycodone-acetaminophen]   ? ?Review of Systems   ?Review of Systems  ?Gastrointestinal:  Positive for abdominal pain.  ? ?Physical Exam ?Updated Vital Signs ?BP (!) 148/97   Pulse (!) 57   Temp 98.7 ?F (37.1 ?C) (Oral)   Resp 17   SpO2 99%  ?Physical Exam ?Vitals and nursing note reviewed.  ?Constitutional:   ?   General: She is not in acute distress. ?HENT:  ?   Head: Normocephalic and atraumatic.  ?   Nose: Nose normal.  ?Eyes:  ?   General: No scleral icterus. ?Cardiovascular:  ?   Rate and Rhythm: Normal rate and regular rhythm.  ?   Pulses: Normal pulses.  ?   Heart sounds: Normal heart sounds.  ?Pulmonary:  ?   Effort: Pulmonary effort is normal. No respiratory distress.  ?   Breath sounds: No wheezing.  ?Abdominal:  ?   Palpations: Abdomen is soft.  ?   Tenderness: There is abdominal tenderness in the right lower quadrant.  ?Musculoskeletal:  ?   Cervical back: Normal range of motion.  ?   Right lower leg: No edema.  ?   Left lower leg: No edema.  ?Skin: ?   General: Skin is warm and dry.  ?   Capillary Refill: Capillary refill takes less than 2 seconds.  ?Neurological:  ?   Mental Status: She is alert. Mental status is at baseline.  ?Psychiatric:     ?   Mood and Affect: Mood normal.     ?   Behavior: Behavior normal.  ? ? ?ED Results / Procedures / Treatments   ?Labs ?(all labs ordered are listed, but only abnormal results are displayed) ?Labs Reviewed  ?CBC WITH DIFFERENTIAL/PLATELET - Abnormal; Notable for the following components:  ?    Result Value  ? WBC 3.9 (*)   ? Platelets 122 (*)   ? All other components within normal limits  ?COMPREHENSIVE METABOLIC PANEL - Abnormal; Notable for the following  components:  ? Anion gap 4 (*)   ? All other components within normal limits  ?URINALYSIS, ROUTINE W REFLEX MICROSCOPIC - Abnormal; Notable for the following components:  ? Specific Gravity, Urine >1.046 (*)   ? All other components within normal limits  ?LIPASE, BLOOD  ?I-STAT BETA HCG BLOOD, ED ([facility], [facility], [facility] ONLY)  ? ? ?EKG ?None ? ?Radiology ?CT Abdomen Pelvis W Contrast ? ?Result Date: 11/13/2021 ?CLINICAL DATA:  Right lower quadrant pain. EXAM: CT ABDOMEN AND PELVIS WITH CONTRAST TECHNIQUE: Multidetector CT imaging of the abdomen and pelvis was performed using the  standard protocol following bolus administration of intravenous contrast. RADIATION DOSE REDUCTION: This exam was performed according to the departmental dose-optimization program which includes automated exposure control, adjustment of the mA and/or kV according to patient size and/or use of iterative reconstruction technique. CONTRAST:  114m OMNIPAQUE IOHEXOL 300 MG/ML  SOLN COMPARISON:  February 13, 2016 FINDINGS: Lower chest: A stable, likely benign 3 mm pleural based lung nodule is seen within the posterior aspect of the right lower lobe. Hepatobiliary: No focal liver abnormality is seen. No gallstones, gallbladder wall thickening, or biliary dilatation. Pancreas: Unremarkable. No pancreatic ductal dilatation or surrounding inflammatory changes. Spleen: Normal in size without focal abnormality. Adrenals/Urinary Tract: Adrenal glands are unremarkable. Kidneys are normal in size, without renal calculi or hydronephrosis. A 2.4 cm simple cyst is seen within the mid left kidney. No additional follow-up or imaging is recommended. Bladder is unremarkable. Stomach/Bowel: Stomach is within normal limits. Appendix appears normal. No evidence of bowel wall thickening, distention, or inflammatory changes. Vascular/Lymphatic: No significant vascular findings are present. No enlarged abdominal or pelvic lymph nodes. Reproductive: The uterus is enlarged,  heterogeneous and lobulated in appearance. A 2.8 cm x 2.0 cm simple cyst is seen along the posterior aspect of the left adnexa. Other: No abdominal wall hernia or abnormality. No abdominopelvic ascites. Musculoskeletal: No acut

## 2021-11-14 NOTE — Discharge Instructions (Addendum)
I have printed you a copy of your CT scan.  Please follow-up with your primary care doctor. ? ?I have also prescribed you a medicine called Bentyl which will help with abdominal cramping if it is related to smooth muscle spasming of your intestines.  I have also prescribed you a nausea medicine called Reglan.  Please take with the hydroxyzine that I prescribed you.  Please drink plenty of water I recommend a relatively bland diet.  You may want to return to the ER for any new or concerning symptoms.  Otherwise please follow-up with your primary care. ? ?You are found to have a large fibroid in your uterus.  Please follow-up with OB/GYN. ?

## 2021-11-15 ENCOUNTER — Ambulatory Visit: Payer: Self-pay

## 2021-11-15 NOTE — Telephone Encounter (Signed)
?  Chief Complaint: abdominal pain ?Symptoms: RLQ pain, 8/10 ?Frequency: 2 weeks ?Pertinent Negatives: NA ?Disposition: '[]'$ ED /'[]'$ Urgent Care (no appt availability in office) / '[x]'$ Appointment(In office/virtual)/ '[]'$  [facility]/ '[]'$ Home Care/ '[]'$ Refused Recommended Disposition /'[]'$ [facility]/ '[]'$  Follow-up with PCP ?Additional Notes: pt went to ED on 11/13/21 and states still having the abdominal pain. She got the medications today but hadn't taken them yet. I scheduled her for OV on 11/16/21 at 0930. I advised pt to take the medications to see if they will help with the pain. Pt states she would go ahead and start those.  ? ?Reason for Disposition ? [1] MILD-MODERATE pain AND [2] constant AND [3] present > 2 hours ? ?Answer Assessment - Initial Assessment Questions ?1. LOCATION: "Where does it hurt?"  ?    RLQ ?3. ONSET: "When did the pain begin?" (e.g., minutes, hours or days ago)  ?    2 weeks ?5. PATTERN "Does the pain come and go, or is it constant?" ?   - If constant: "Is it getting better, staying the same, or worsening?"  ?    (Note: Constant means the pain never goes away completely; most serious pain is constant and it progresses)  ?   - If intermittent: "How long does it last?" "Do you have pain now?" ?    (Note: Intermittent means the pain goes away completely between bouts) ?    Constant  ?6. SEVERITY: "How bad is the pain?"  (e.g., Scale 1-10; mild, moderate, or severe) ?  - MILD (1-3): doesn't interfere with normal activities, abdomen soft and not tender to touch  ?  - MODERATE (4-7): interferes with normal activities or awakens from sleep, abdomen tender to touch  ?  - SEVERE (8-10): excruciating pain, doubled over, unable to do any normal activities  ?    8 ?10. OTHER SYMPTOMS: "Do you have any other symptoms?" (e.g., back pain, diarrhea, fever, urination pain, vomiting) ? ?Protocols used: Abdominal Pain - Female-A-AH ? ?

## 2021-11-16 ENCOUNTER — Encounter: Payer: Self-pay | Admitting: Internal Medicine

## 2021-11-16 ENCOUNTER — Other Ambulatory Visit: Payer: Self-pay

## 2021-11-16 ENCOUNTER — Ambulatory Visit: Payer: Self-pay | Attending: Internal Medicine | Admitting: Internal Medicine

## 2021-11-16 VITALS — BP 140/87 | HR 58 | Resp 16 | Wt 212.4 lb

## 2021-11-16 DIAGNOSIS — R109 Unspecified abdominal pain: Secondary | ICD-10-CM

## 2021-11-16 DIAGNOSIS — N898 Other specified noninflammatory disorders of vagina: Secondary | ICD-10-CM

## 2021-11-16 MED ORDER — DULOXETINE HCL 60 MG PO CPEP
60.0000 mg | ORAL_CAPSULE | Freq: Every day | ORAL | 3 refills | Status: DC
Start: 2021-11-16 — End: 2022-01-15
  Filled 2021-11-16: qty 30, 30d supply, fill #0
  Filled 2021-12-10 – 2021-12-18 (×2): qty 30, 30d supply, fill #1

## 2021-11-16 NOTE — Progress Notes (Signed)
? ? ?Patient ID: Heidi Barrett, female    DOB: 1965-11-18  MRN: [id number] ? ?CC: Hospitalization Follow-up (ED) ? ? ?Subjective: ?Heidi Barrett is a 56 y.o. female who presents for f/u ED ?Her concerns today include:  ?history of hypertension, menorrhagia secondary to fibroids, hypothyroidism, degenerative disease of the lumbar spine, low back pain with sciatica . ? ?Pt seen in ER 11/14/21 with RLQ abd pain x 3-4 wks ?Work up included CT abdomen/Pelvis which revealed no acute findings, fibroid uterus and incidental finding of simple cyst in the left adnexa likely ovarian in origin.  Urinalysis was negative, urine pregnancy negative, hemoglobin normal, lipase normal and chemistry normal.  Patient was advised to follow-up with women's health care at [facility] for the fibroid and the cysts.  However she is uninsured.  She plans to apply for the orange card.  She was discharged home with dicyclomine, Reglan and hydroxyzine.  She tells me the latter is for anxiety. ? ?Today:  ?Pain still present. Before pain would come and go for 3 wks but now it is constant.   ?Not affected by food. No vomiting but some nausea. No dysuria or blood in urine.  Reports thick white vaginal dischg. Some itching sometimes.   No menses x 6-7 mths.  Currently not sexually active and has not been for 12-13 yrs.  ?Standing and laying on the RT side makes the pain worse.   ?No fever.  ?Just filled rxn for Reglan, Dycyclomine and Hydroxyzine yesterday.  ?Out of work x for 1 wk.  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  Has to stand the entire 4 hrs and has to lift up to 50 lbs. ? ?Request RF on Cymbalta.  Sent to [facility] and was told cost $57.  Would like rxn sent to our pharmacy ?Patient Active Problem List  ? Diagnosis Date Noted  ? Palpitation 09/10/2017  ? Atypical chest pain 07/12/2016  ? Generalized anxiety disorder 07/04/2016  ? Right lower quadrant abdominal pain 02/08/2016  ? Eye muscle twitches 02/08/2016   ? Fibroid, uterine 02/08/2016  ? Piriformis syndrome of right side 01/31/2015  ? Allergic reaction caused by a drug 01/27/2015  ? Heel spur 01/16/2015  ? Essential hypertension, benign 01/16/2015  ? Benign essential HTN 05/31/2014  ? Plantar fasciitis 05/31/2014  ? Other specified hypothyroidism 05/31/2014  ? Intramural leiomyoma of uterus 05/31/2014  ? Menorrhagia 01/04/2013  ? Fibroids 01/04/2013  ?  ? ?Current Outpatient Medications on File Prior to Visit  ?Medication Sig Dispense Refill  ? acetaminophen (TYLENOL) 325 MG tablet Take 650 mg by mouth every 6 (six) hours as needed for mild pain. (Patient not taking: Reported on 07/04/2021)    ? albuterol (PROVENTIL HFA) 108 (90 Base) MCG/ACT inhaler Inhale 2 puffs into the lungs every 4 (four) hours as needed for wheezing or shortness of breath. (Patient not taking: Reported on 07/04/2021) 6.7 g 2  ? amLODipine (NORVASC) 10 MG tablet TAKE 1 TABLET(10 MG) BY MOUTH DAILY FOR BLOOD PRESSURE 90 tablet 0  ? atorvastatin (LIPITOR) 20 MG tablet Take 1 tablet (20 mg total) by mouth daily. 90 tablet 1  ? calcium carbonate (CALCIUM 600) 600 MG TABS tablet Take 1 tablet (600 mg total) by mouth daily with breakfast. 60 tablet 3  ? cholecalciferol (VITAMIN D) 1000 UNITS tablet Take 1 tablet (1,000 Units total) by mouth daily. (Patient not taking: Reported on 07/04/2021) 90 tablet 3  ?  dicyclomine (BENTYL) 20 MG tablet Take 1 tablet (20 mg total) by mouth 2 (two) times daily. 20 tablet 0  ? EPINEPHrine 0.3 mg/0.3 mL IJ SOAJ injection Inject 0.3 mg into the muscle as needed for anaphylaxis. 1 each prn  ? fluticasone (FLONASE) 50 MCG/ACT nasal spray SHAKE LIQUID AND USE 2 SPRAYS IN EACH NOSTRIL DAILY 16 g 1  ? gabapentin (NEURONTIN) 300 MG capsule Take 2 capsules (600 mg total) by mouth at bedtime. 180 capsule 1  ? hydrOXYzine (ATARAX) 25 MG tablet TAKE 1 TABLET(25 MG) BY MOUTH AT BEDTIME 30 tablet 6  ? hydrOXYzine (ATARAX) 25 MG tablet Take 1 tablet (25 mg total) by mouth every 6  (six) hours. 12 tablet 0  ? ibuprofen (ADVIL) 600 MG tablet TAKE 1 TABLET(600 MG) BY MOUTH EVERY 8 HOURS AS NEEDED 60 tablet 3  ? levothyroxine (SYNTHROID) 150 MCG tablet TAKE 1 TABLET BY MOUTH DAILY BEFORE BREAKFAST 90 tablet 1  ? metoCLOPramide (REGLAN) 10 MG tablet Take 1 tablet (10 mg total) by mouth every 6 (six) hours. 30 tablet 0  ? metoprolol tartrate (LOPRESSOR) 50 MG tablet TAKE 1 TABLET(50 MG) BY MOUTH TWICE DAILY 180 tablet 0  ? Multiple Vitamin (MULTIVITAMIN WITH MINERALS) TABS tablet Take 1 tablet by mouth daily. 90 tablet 3  ? predniSONE (DELTASONE) 20 MG tablet Take 1 tablet (20 mg total) by mouth daily with breakfast. 5 tablet 0  ? tiZANidine (ZANAFLEX) 4 MG tablet Take 1 tablet (4 mg total) by mouth 2 (two) times daily. 60 tablet 6  ? ?No current facility-administered medications on file prior to visit.  ? ? ?Allergies  ?Allergen Reactions  ? Hctz [Hydrochlorothiazide] Shortness Of Breath and Other (See Comments)  ?  wheezing  ? Robaxin [Methocarbamol] Swelling  ?  Sensation of throat swelling/difficulty swallowing after taking this medication  ? Tramadol Anaphylaxis and Swelling  ? Hydrocodone Nausea And Vomiting  ?  dizzines  ? Percocet [Oxycodone-Acetaminophen] Nausea And Vomiting and Other (See Comments)  ?  weakness  ? ? ?Social History  ? ?Socioeconomic History  ? Marital status: Single  ?  Spouse name: Not on file  ? Number of children: Not on file  ? Years of education: Not on file  ? Highest education level: Not on file  ?Occupational History  ? Not on file  ?Tobacco Use  ? Smoking status: Never  ? Smokeless tobacco: Never  ?Vaping Use  ? Vaping Use: Never used  ?Substance and Sexual Activity  ? Alcohol use: No  ? Drug use: No  ? Sexual activity: Never  ?  Birth control/protection: None  ?Other Topics Concern  ? Not on file  ?Social History Narrative  ? Not on file  ? ?Social Determinants of Health  ? ?Financial Resource Strain: Not on file  ?Food Insecurity: Not on file  ?Transportation  Needs: Not on file  ?Physical Activity: Not on file  ?Stress: Not on file  ?Social Connections: Not on file  ?Intimate Partner Violence: Not on file  ? ? ?Family History  ?Problem Relation Age of Onset  ? Heart disease Mother   ? Stroke Mother   ? Cancer Father   ?     prostate  ? ? ?Past Surgical History:  ?Procedure Laterality Date  ? IR GENERIC HISTORICAL  05/28/2016  ? IR RADIOLOGIST EVAL & MGMT 05/28/2016 Markus Daft, MD [facility] INTERV RAD  ? IR RADIOLOGIST EVAL & MGMT  03/06/2017  ? IR RADIOLOGIST EVAL & MGMT  02/17/2018  ? ? ?  ROS: ?Review of Systems ?Negative except as stated above ? ?PHYSICAL EXAM: ?BP 140/87   Pulse (!) 58   Resp 16   Wt 212 lb 6.4 oz (96.3 kg)   SpO2 98%   BMI 32.30 kg/m?   ?Wt Readings from Last 3 Encounters:  ?11/16/21 212 lb 6.4 oz (96.3 kg)  ?07/04/21 208 lb 3.2 oz (94.4 kg)  ?10/26/20 214 lb 9.6 oz (97.3 kg)  ? ? ?Physical Exam ? ?General appearance - alert, well appearing, middle-age African-American female and in no distress ?Mental status - normal mood, behavior, speech, dress, motor activity, and thought processes ?Abdomen -normal bowel sounds, nondistended, soft.  She has mild diffuse tenderness that is more pronounced in the right lower abdomen.  No guarding or rebound.  Pain in the right lower quadrant is exaggerated with flexion of the right knee and hip toward the abdomen. ? ? ? ?  Latest Ref Rng & Units 11/13/2021  ?  3:50 PM 10/26/2020  ?  9:57 AM 01/14/2020  ? 10:54 AM  ?CMP  ?Glucose 70 - 99 mg/dL 83   75   81    ?BUN 6 - 20 mg/dL '17   13   13    '$ ?Creatinine 0.44 - 1.00 mg/dL 0.89   0.90   1.01    ?Sodium 135 - 145 mmol/L 141   141   139    ?Potassium 3.5 - 5.1 mmol/L 3.5   3.8   4.0    ?Chloride 98 - 111 mmol/L 108   103   102    ?CO2 22 - 32 mmol/L '29   24   25    '$ ?Calcium 8.9 - 10.3 mg/dL 9.0   9.2   9.4    ?Total Protein 6.5 - 8.1 g/dL 6.8   6.7   6.9    ?Total Bilirubin 0.3 - 1.2 mg/dL 1.0   0.6   0.5    ?Alkaline Phos 38 - 126 U/L 48   49   52    ?AST 15 - 41 U/L '25    21   13    '$ ?ALT 0 - 44 U/L 32   23   15    ? ?Lipid Panel  ?   ?Component Value Date/Time  ? CHOL 126 10/26/2020 0957  ? TRIG 64 10/26/2020 0957  ? HDL 53 10/26/2020 0957  ? CHOLHDL 2.4 10/26/2020 0957  ?

## 2021-11-19 ENCOUNTER — Telehealth: Payer: Self-pay

## 2021-11-19 NOTE — Telephone Encounter (Signed)
-----   Message from Ladell Pier, MD sent at 11/16/2021  1:09 PM EDT ----- ?Regarding: Vaginal swab ?I forgot to enter the order for the vaginal swab on this patient.  I entered it just now as a future lab.  Please call her and let her know that she can return to the lab to have it done. ? ?

## 2021-11-19 NOTE — Telephone Encounter (Signed)
Contacted pt to go over provider message pt didn't answer lvm  ?

## 2021-11-21 ENCOUNTER — Other Ambulatory Visit: Payer: Medicaid Other

## 2021-11-26 ENCOUNTER — Encounter: Payer: Medicaid Other | Admitting: Clinical

## 2021-11-26 ENCOUNTER — Other Ambulatory Visit: Payer: Medicaid Other

## 2021-11-28 ENCOUNTER — Other Ambulatory Visit: Payer: Medicaid Other

## 2021-11-30 ENCOUNTER — Telehealth: Payer: Self-pay | Admitting: Clinical

## 2021-11-30 NOTE — Telephone Encounter (Signed)
I attempted to call pt as she previously missed our appt. I provided pt with counseling resources in her mychart. ?

## 2021-12-11 ENCOUNTER — Other Ambulatory Visit: Payer: Self-pay

## 2021-12-18 ENCOUNTER — Other Ambulatory Visit: Payer: Self-pay

## 2022-01-01 ENCOUNTER — Ambulatory Visit: Payer: 59 | Admitting: Family Medicine

## 2022-01-15 ENCOUNTER — Encounter: Payer: Self-pay | Admitting: Family Medicine

## 2022-01-15 ENCOUNTER — Ambulatory Visit: Payer: Commercial Managed Care - HMO | Attending: Family Medicine | Admitting: Family Medicine

## 2022-01-15 VITALS — BP 128/86 | HR 68 | Temp 98.9°F | Ht 68.0 in | Wt 209.6 lb

## 2022-01-15 DIAGNOSIS — M5441 Lumbago with sciatica, right side: Secondary | ICD-10-CM

## 2022-01-15 DIAGNOSIS — Z23 Encounter for immunization: Secondary | ICD-10-CM

## 2022-01-15 DIAGNOSIS — I10 Essential (primary) hypertension: Secondary | ICD-10-CM

## 2022-01-15 DIAGNOSIS — M25562 Pain in left knee: Secondary | ICD-10-CM | POA: Diagnosis not present

## 2022-01-15 DIAGNOSIS — M25561 Pain in right knee: Secondary | ICD-10-CM

## 2022-01-15 DIAGNOSIS — J3489 Other specified disorders of nose and nasal sinuses: Secondary | ICD-10-CM

## 2022-01-15 DIAGNOSIS — F419 Anxiety disorder, unspecified: Secondary | ICD-10-CM | POA: Diagnosis not present

## 2022-01-15 DIAGNOSIS — F32A Depression, unspecified: Secondary | ICD-10-CM | POA: Diagnosis not present

## 2022-01-15 DIAGNOSIS — E7800 Pure hypercholesterolemia, unspecified: Secondary | ICD-10-CM

## 2022-01-15 DIAGNOSIS — M5442 Lumbago with sciatica, left side: Secondary | ICD-10-CM

## 2022-01-15 DIAGNOSIS — D259 Leiomyoma of uterus, unspecified: Secondary | ICD-10-CM

## 2022-01-15 DIAGNOSIS — E038 Other specified hypothyroidism: Secondary | ICD-10-CM

## 2022-01-15 DIAGNOSIS — G8929 Other chronic pain: Secondary | ICD-10-CM

## 2022-01-15 DIAGNOSIS — Z91030 Bee allergy status: Secondary | ICD-10-CM

## 2022-01-15 MED ORDER — FLUTICASONE PROPIONATE 50 MCG/ACT NA SUSP: NASAL | 1 refills | Status: AC

## 2022-01-15 MED ORDER — PREDNISONE 20 MG PO TABS: 20.0000 mg | ORAL_TABLET | Freq: Every day | ORAL | 0 refills | Status: DC

## 2022-01-15 MED ORDER — HYDROXYZINE HCL 25 MG PO TABS: ORAL_TABLET | ORAL | 6 refills | Status: DC

## 2022-01-15 MED ORDER — GABAPENTIN 300 MG PO CAPS: 600.0000 mg | ORAL_CAPSULE | Freq: Every day | ORAL | 1 refills | Status: DC

## 2022-01-15 MED ORDER — ATORVASTATIN CALCIUM 20 MG PO TABS: 20.0000 mg | ORAL_TABLET | Freq: Every day | ORAL | 1 refills | Status: DC

## 2022-01-15 MED ORDER — TIZANIDINE HCL 4 MG PO TABS: 4.0000 mg | ORAL_TABLET | Freq: Two times a day (BID) | ORAL | 6 refills | Status: DC

## 2022-01-15 MED ORDER — AMLODIPINE BESYLATE 10 MG PO TABS: ORAL_TABLET | ORAL | 1 refills | Status: DC

## 2022-01-15 MED ORDER — METOPROLOL TARTRATE 50 MG PO TABS: 50.0000 mg | ORAL_TABLET | Freq: Two times a day (BID) | ORAL | 1 refills | Status: DC

## 2022-01-15 MED ORDER — EPINEPHRINE 0.3 MG/0.3ML IJ SOAJ: 0.3000 mg | INTRAMUSCULAR | 99 refills | Status: AC | PRN

## 2022-01-15 MED ORDER — DULOXETINE HCL 60 MG PO CPEP: 60.0000 mg | ORAL_CAPSULE | Freq: Every day | ORAL | 1 refills | Status: DC

## 2022-01-15 NOTE — Patient Instructions (Signed)
Chronic Knee Pain, Adult Chronic knee pain is pain in one or both knees that lasts longer than 3 months. Symptoms of chronic knee pain may include swelling, stiffness, and discomfort. Age-related wear and tear (osteoarthritis) of the knee joint is the most common cause of chronic knee pain. Other possible causes include: A long-term immune-related disease that causes inflammation of the knee (rheumatoid arthritis). This usually affects both knees. Inflammatory arthritis, such as gout or pseudogout. An injury to the knee that causes arthritis. An injury to the knee that damages the ligaments. Ligaments are strong tissues that connect bones to each other. Runner's knee or pain behind the kneecap. Treatment for chronic knee pain depends on the cause. The main treatments for chronic knee pain are physical therapy and weight loss. This condition may also be treated with medicines, injections, a knee sleeve or brace, and by using crutches. Rest, ice, pressure (compression), and elevation, also known as RICE therapy, may also be recommended. Follow these instructions at home: If you have a knee sleeve or brace:  Wear the knee sleeve or brace as told by your health care provider. Remove it only as told by your health care provider. Loosen it if your toes tingle, become numb, or turn cold and blue. Keep it clean. If the sleeve or brace is not waterproof: Do not let it get wet. Remove it if allowed by your health care provider, or cover it with a watertight covering when you take a bath or a shower. Managing pain, stiffness, and swelling     If directed, apply heat to the affected area as often as told by your health care provider. Use the heat source that your health care provider recommends, such as a moist heat pack or a heating pad. If you have a removable knee sleeve or brace, remove it as told by your health care provider. Place a towel between your skin and the heat source. Leave the heat on for  20-30 minutes. Remove the heat if your skin turns bright red. This is especially important if you are unable to feel pain, heat, or cold. You may have a greater risk of getting burned. If directed, put ice on the affected area. To do this: If you have a removable knee sleeve or brace, remove it as told by your health care provider. Put ice in a plastic bag. Place a towel between your skin and the bag. Leave the ice on for 20 minutes, 2-3 times a day. Remove the ice if your skin turns bright red. This is very important. If you cannot feel pain, heat, or cold, you have a greater risk of damage to the area. Move your toes often to reduce stiffness and swelling. Raise (elevate) the injured area above the level of your heart while you are sitting or lying down. Activity Avoid high-impact activities or exercises, such as running, jumping rope, or doing jumping jacks. Follow the exercise plan that your health care provider designed for you. Your health care provider may suggest that you: Avoid activities that make knee pain worse. This may require you to change your exercise routines, sport participation, or job duties. Wear shoes with cushioned soles. Avoid sports that require running and sudden changes in direction. Do physical therapy. Physical therapy is planned to match your needs and abilities. It may include exercises for strength, flexibility, stability, and endurance. Do exercises that increase balance and strength, such as tai chi and yoga. Do not use the injured limb to support your   body weight until your health care provider says that you can. Use crutches as told by your health care provider. Return to your normal activities as told by your health care provider. Ask your health care provider what activities are safe for you. General instructions Take over-the-counter and prescription medicines only as told by your health care provider. Lose weight if you are overweight. Losing even a  little weight can reduce knee pain. Ask your health care provider what your ideal weight is, and how to safely lose extra weight. A dietitian may be able to help you plan your meals. Do not use any products that contain nicotine or tobacco, such as cigarettes, e-cigarettes, and chewing tobacco. These can delay healing. If you need help quitting, ask your health care provider. Keep all follow-up visits. This is important. Contact a health care provider if: You have knee pain that is not getting better or gets worse. You are unable to do your physical therapy exercises due to knee pain. Get help right away if: Your knee swells and the swelling becomes worse. You cannot move your knee. You have severe knee pain. Summary Knee pain that lasts more than 3 months is considered chronic knee pain. The main treatments for chronic knee pain are physical therapy and weight loss. You may also need to take medicines, wear a knee sleeve or brace, use crutches, and apply ice or heat. Losing even a little weight can reduce knee pain. Ask your health care provider what your ideal weight is, and how to safely lose extra weight. A dietitian may be able to help you plan your meals. Follow the exercise plan that your health care provider designed for you. This information is not intended to replace advice given to you by your health care provider. Make sure you discuss any questions you have with your health care provider. Document Revised: 01/05/2020 Document Reviewed: 01/05/2020 Elsevier Patient Education  [place].

## 2022-01-15 NOTE — Progress Notes (Signed)
Joint pain Lower back pain Letter for work. Shingles vaccine.

## 2022-01-15 NOTE — Progress Notes (Signed)
Subjective:  Patient ID: Heidi Barrett, female    DOB: [birth year]  Age: 56 y.o. MRN: [id number]  CC: Hypertension   HPI Heidi Barrett is a 56 y.o. year old female with a history of hypertension, menorrhagia secondary to fibroids, hypothyroidism, degenerative disease of the lumbar spine, low back pain with sciatica .  Interval History:  She work 4 hours straight at [employer] and is not allowed to go use the restroom.  She would like a job where she can use the restroom and would like my opinion about it.  Complains of bilateral knee pain R >L and sometimes feels like her knee is about to give out. Pain is rated as 7-8/10. She uses Ibuprofen and Tizanidine with mild relief.  Going up and down the stairs is most bothersome and she does endorse some right knee swelling.  Her lower back continues to hurt and radiates down her legs. Pain is worse on waking up in the morning. For her work she has to stand for 4 hours straight.  Previously seen by neurosurgery but never received epidural spinal injections.  She no longer has the abdominal pain she previously had at her last visit.  CT abdomen and pelvis revealed presence of uterine fibroids and she endorses having periods every month.  She would like to see GYN to discuss management of her fibroids. She requests a refill of her EpiPen which she uses for bee stings. Past Medical History:  Diagnosis Date   Anemia    Hypertension    Thyroid disease     Past Surgical History:  Procedure Laterality Date   IR GENERIC HISTORICAL  05/28/2016   IR RADIOLOGIST EVAL & MGMT 05/28/2016 Markus Daft, MD [facility] INTERV RAD   IR RADIOLOGIST EVAL & MGMT  03/06/2017   IR RADIOLOGIST EVAL & MGMT  02/17/2018    Family History  Problem Relation Age of Onset   Heart disease Mother    Stroke Mother    Cancer Father        prostate    Social History   Socioeconomic History   Marital status: Single    Spouse name: Not on file   Number of children: Not on  file   Years of education: Not on file   Highest education level: Not on file  Occupational History   Not on file  Tobacco Use   Smoking status: Never   Smokeless tobacco: Never  Vaping Use   Vaping Use: Never used  Substance and Sexual Activity   Alcohol use: No   Drug use: No   Sexual activity: Never    Birth control/protection: None  Other Topics Concern   Not on file  Social History Narrative   Not on file   Social Determinants of Health   Financial Resource Strain: Not on file  Food Insecurity: Not on file  Transportation Needs: Not on file  Physical Activity: Not on file  Stress: Not on file  Social Connections: Not on file    Allergies  Allergen Reactions   Hctz [Hydrochlorothiazide] Shortness Of Breath and Other (See Comments)    wheezing   Robaxin [Methocarbamol] Swelling    Sensation of throat swelling/difficulty swallowing after taking this medication   Tramadol Anaphylaxis and Swelling   Hydrocodone Nausea And Vomiting    dizzines   Percocet [Oxycodone-Acetaminophen] Nausea And Vomiting and Other (See Comments)    weakness    Outpatient Medications Prior to Visit  Medication Sig Dispense Refill   acetaminophen (  TYLENOL) 325 MG tablet Take 650 mg by mouth every 6 (six) hours as needed for mild pain.     albuterol (PROVENTIL HFA) 108 (90 Base) MCG/ACT inhaler Inhale 2 puffs into the lungs every 4 (four) hours as needed for wheezing or shortness of breath. 6.7 g 2   calcium carbonate (CALCIUM 600) 600 MG TABS tablet Take 1 tablet (600 mg total) by mouth daily with breakfast. 60 tablet 3   cholecalciferol (VITAMIN D) 1000 UNITS tablet Take 1 tablet (1,000 Units total) by mouth daily. 90 tablet 3   ibuprofen (ADVIL) 600 MG tablet TAKE 1 TABLET(600 MG) BY MOUTH EVERY 8 HOURS AS NEEDED 60 tablet 3   levothyroxine (SYNTHROID) 150 MCG tablet TAKE 1 TABLET BY MOUTH DAILY BEFORE BREAKFAST 90 tablet 1   Multiple Vitamin (MULTIVITAMIN WITH MINERALS) TABS tablet Take  1 tablet by mouth daily. 90 tablet 3   amLODipine (NORVASC) 10 MG tablet TAKE 1 TABLET(10 MG) BY MOUTH DAILY FOR BLOOD PRESSURE 90 tablet 0   atorvastatin (LIPITOR) 20 MG tablet Take 1 tablet (20 mg total) by mouth daily. 90 tablet 1   DULoxetine (CYMBALTA) 60 MG capsule Take 1 capsule (60 mg total) by mouth daily. For chronic pain 30 capsule 3   EPINEPHrine 0.3 mg/0.3 mL IJ SOAJ injection Inject 0.3 mg into the muscle as needed for anaphylaxis. 1 each prn   fluticasone (FLONASE) 50 MCG/ACT nasal spray SHAKE LIQUID AND USE 2 SPRAYS IN EACH NOSTRIL DAILY 16 g 1   gabapentin (NEURONTIN) 300 MG capsule Take 2 capsules (600 mg total) by mouth at bedtime. 180 capsule 1   hydrOXYzine (ATARAX) 25 MG tablet TAKE 1 TABLET(25 MG) BY MOUTH AT BEDTIME 30 tablet 6   hydrOXYzine (ATARAX) 25 MG tablet Take 1 tablet (25 mg total) by mouth every 6 (six) hours. 12 tablet 0   metoprolol tartrate (LOPRESSOR) 50 MG tablet TAKE 1 TABLET(50 MG) BY MOUTH TWICE DAILY 180 tablet 0   tiZANidine (ZANAFLEX) 4 MG tablet Take 1 tablet (4 mg total) by mouth 2 (two) times daily. 60 tablet 6   dicyclomine (BENTYL) 20 MG tablet Take 1 tablet (20 mg total) by mouth 2 (two) times daily. (Patient not taking: Reported on 01/15/2022) 20 tablet 0   metoCLOPramide (REGLAN) 10 MG tablet Take 1 tablet (10 mg total) by mouth every 6 (six) hours. (Patient not taking: Reported on 01/15/2022) 30 tablet 0   predniSONE (DELTASONE) 20 MG tablet Take 1 tablet (20 mg total) by mouth daily with breakfast. (Patient not taking: Reported on 01/15/2022) 5 tablet 0   No facility-administered medications prior to visit.     ROS Review of Systems  Constitutional:  Negative for activity change and appetite change.  HENT:  Negative for sinus pressure and sore throat.   Respiratory:  Negative for chest tightness, shortness of breath and wheezing.   Cardiovascular:  Negative for chest pain and palpitations.  Gastrointestinal:  Negative for abdominal  distention, abdominal pain and constipation.  Genitourinary: Negative.   Musculoskeletal:        See HPI  Psychiatric/Behavioral:  Negative for behavioral problems and dysphoric mood.     Objective:  BP 128/86   Pulse 68   Temp 98.9 F (37.2 C) (Oral)   Ht '5\' 8"'$  (1.727 m)   Wt 209 lb 9.6 oz (95.1 kg)   SpO2 99%   BMI 31.87 kg/m      01/15/2022   10:57 AM 11/16/2021    9:46 AM 11/14/2021  12:17 AM  BP/Weight  Systolic BP 834 196 222  Diastolic BP 86 87 80  Wt. (Lbs) 209.6 212.4   BMI 31.87 kg/m2 32.3 kg/m2       Physical Exam Constitutional:      Appearance: She is well-developed.  Cardiovascular:     Rate and Rhythm: Normal rate.     Heart sounds: Normal heart sounds. No murmur heard. Pulmonary:     Effort: Pulmonary effort is normal.     Breath sounds: Normal breath sounds. No wheezing or rales.  Chest:     Chest wall: No tenderness.  Abdominal:     General: Bowel sounds are normal. There is no distension.     Palpations: Abdomen is soft. There is no mass.     Tenderness: There is no abdominal tenderness.  Musculoskeletal:     Right lower leg: Edema present.     Left lower leg: No edema.     Comments: Slight right knee edema, tenderness to palpation, restricted flexion and extension due to pain Normal appearance of left knee, crepitus on range of motion  Neurological:     Mental Status: She is alert and oriented to person, place, and time.     Gait: Gait abnormal.  Psychiatric:        Mood and Affect: Mood normal.        Latest Ref Rng & Units 11/13/2021    3:50 PM 10/26/2020    9:57 AM 01/14/2020   10:54 AM  CMP  Glucose 70 - 99 mg/dL 83  75  81   BUN 6 - 20 mg/dL '17  13  13   '$ Creatinine 0.44 - 1.00 mg/dL 0.89  0.90  1.01   Sodium 135 - 145 mmol/L 141  141  139   Potassium 3.5 - 5.1 mmol/L 3.5  3.8  4.0   Chloride 98 - 111 mmol/L 108  103  102   CO2 22 - 32 mmol/L '29  24  25   '$ Calcium 8.9 - 10.3 mg/dL 9.0  9.2  9.4   Total Protein 6.5 - 8.1 g/dL  6.8  6.7  6.9   Total Bilirubin 0.3 - 1.2 mg/dL 1.0  0.6  0.5   Alkaline Phos 38 - 126 U/L 48  49  52   AST 15 - 41 U/L '25  21  13   '$ ALT 0 - 44 U/L 32  23  15     Lipid Panel     Component Value Date/Time   CHOL 126 10/26/2020 0957   TRIG 64 10/26/2020 0957   HDL 53 10/26/2020 0957   CHOLHDL 2.4 10/26/2020 0957   CHOLHDL 2.6 01/16/2015 1041   VLDL 9 01/16/2015 1041   LDLCALC 59 10/26/2020 0957    CBC    Component Value Date/Time   WBC 3.9 (L) 11/13/2021 1550   RBC 4.23 11/13/2021 1550   HGB 13.2 11/13/2021 1550   HGB 13.3 10/13/2017 1346   HCT 38.3 11/13/2021 1550   HCT 38.5 10/13/2017 1346   PLT 122 (L) 11/13/2021 1550   PLT 167 10/13/2017 1346   MCV 90.5 11/13/2021 1550   MCV 90 10/13/2017 1346   MCH 31.2 11/13/2021 1550   MCHC 34.5 11/13/2021 1550   RDW 13.2 11/13/2021 1550   RDW 13.6 10/13/2017 1346   LYMPHSABS 1.3 11/13/2021 1550   LYMPHSABS 1.1 05/07/2017 1120   MONOABS 0.4 11/13/2021 1550   EOSABS 0.3 11/13/2021 1550   EOSABS 0.2 05/07/2017 1120  BASOSABS 0.0 11/13/2021 1550   BASOSABS 0.0 05/07/2017 1120    Lab Results  Component Value Date   HGBA1C 4.5 05/31/2014    Lab Results  Component Value Date   TSH 0.461 07/04/2021    Assessment & Plan:  1. Chronic pain of both knees Uncontrolled with unstable gait Advised to use cane as she is high risk for falls Short course of prednisone after which she will continue with NSAID She will benefit from cortisone injection Advised to use knee brace - DG Knee Complete 4 Views Left; Future - DG Knee Complete 4 Views Right; Future - AMB referral to orthopedics - predniSONE (DELTASONE) 20 MG tablet; Take 1 tablet (20 mg total) by mouth daily with breakfast.  Dispense: 5 tablet; Refill: 0  2. Uterine leiomyoma, unspecified location Periods are regular CT also revealed presence of adnexal cyst - Ambulatory referral to Gynecology  3. Allergy to bee sting - EPINEPHrine 0.3 mg/0.3 mL IJ SOAJ injection;  Inject 0.3 mg into the muscle as needed for anaphylaxis.  Dispense: 1 each; Refill: prn  4. Essential hypertension, benign Controlled Counseled on blood pressure goal of less than 130/80, low-sodium, DASH diet, medication compliance, 150 minutes of moderate intensity exercise per week. Discussed medication compliance, adverse effects. - amLODipine (NORVASC) 10 MG tablet; TAKE 1 TABLET(10 MG) BY MOUTH DAILY FOR BLOOD PRESSURE  Dispense: 90 tablet; Refill: 1 - metoprolol tartrate (LOPRESSOR) 50 MG tablet; Take 1 tablet (50 mg total) by mouth 2 (two) times daily.  Dispense: 180 tablet; Refill: 1  5. Hypercholesterolemia Controlled Low-cholesterol diet - atorvastatin (LIPITOR) 20 MG tablet; Take 1 tablet (20 mg total) by mouth daily.  Dispense: 90 tablet; Refill: 1  6. Sinus pain Stable - fluticasone (FLONASE) 50 MCG/ACT nasal spray; SHAKE LIQUID AND USE 2 SPRAYS IN EACH NOSTRIL DAILY  Dispense: 16 g; Refill: 1  7. Chronic midline low back pain with bilateral sciatica Uncontrolled She may benefit from epidural spinal injection She has not been taking gabapentin due to sedating side effects which can affect her job She decides she would like to leave her current job at [employer] and seek for something else.  She will start taking the gabapentin.  Advised that if she does have excessive sedation in the morning she can hold off on taking the hydroxyzine at night - Ambulatory referral to Physical Medicine Rehab - gabapentin (NEURONTIN) 300 MG capsule; Take 2 capsules (600 mg total) by mouth at bedtime.  Dispense: 180 capsule; Refill: 1 - tiZANidine (ZANAFLEX) 4 MG tablet; Take 1 tablet (4 mg total) by mouth 2 (two) times daily.  Dispense: 60 tablet; Refill: 6  8. Anxiety and depression Superimposed caregiver stress Continue hydroxyzine and duloxetine - hydrOXYzine (ATARAX) 25 MG tablet; TAKE 1 TABLET(25 MG) BY MOUTH AT BEDTIME  Dispense: 30 tablet; Refill: 6  9. Other specified  hypothyroidism Last thyroid panel was normal We will check labs - T4, free - TSH  10. Need for shingles vaccine Prescription for shingles vaccine has been provided.    Meds ordered this encounter  Medications   predniSONE (DELTASONE) 20 MG tablet    Sig: Take 1 tablet (20 mg total) by mouth daily with breakfast.    Dispense:  5 tablet    Refill:  0   EPINEPHrine 0.3 mg/0.3 mL IJ SOAJ injection    Sig: Inject 0.3 mg into the muscle as needed for anaphylaxis.    Dispense:  1 each    Refill:  prn   amLODipine (  NORVASC) 10 MG tablet    Sig: TAKE 1 TABLET(10 MG) BY MOUTH DAILY FOR BLOOD PRESSURE    Dispense:  90 tablet    Refill:  1   atorvastatin (LIPITOR) 20 MG tablet    Sig: Take 1 tablet (20 mg total) by mouth daily.    Dispense:  90 tablet    Refill:  1   DULoxetine (CYMBALTA) 60 MG capsule    Sig: Take 1 capsule (60 mg total) by mouth daily. For chronic pain    Dispense:  90 capsule    Refill:  1   fluticasone (FLONASE) 50 MCG/ACT nasal spray    Sig: SHAKE LIQUID AND USE 2 SPRAYS IN EACH NOSTRIL DAILY    Dispense:  16 g    Refill:  1   gabapentin (NEURONTIN) 300 MG capsule    Sig: Take 2 capsules (600 mg total) by mouth at bedtime.    Dispense:  180 capsule    Refill:  1   metoprolol tartrate (LOPRESSOR) 50 MG tablet    Sig: Take 1 tablet (50 mg total) by mouth 2 (two) times daily.    Dispense:  180 tablet    Refill:  1   hydrOXYzine (ATARAX) 25 MG tablet    Sig: TAKE 1 TABLET(25 MG) BY MOUTH AT BEDTIME    Dispense:  30 tablet    Refill:  6   tiZANidine (ZANAFLEX) 4 MG tablet    Sig: Take 1 tablet (4 mg total) by mouth 2 (two) times daily.    Dispense:  60 tablet    Refill:  6    Follow-up: Return in about 6 months (around 07/17/2022) for Chronic medical conditions.   Visit required 47 minutes of patient care including median intraservice time reviewing previous notes and test results, counseling patient on diagnosis and work up of knee and back painin  addition to management of chronic medical conditions.Time also spent ordering medications, investigations and documenting in the chart.  All questions were answered to the patient's satisfaction     Charlott Rakes, MD, FAAFP. [facility] and [place], [place]   01/15/2022, 12:53 PM

## 2022-01-16 ENCOUNTER — Other Ambulatory Visit: Payer: Self-pay | Admitting: Family Medicine

## 2022-01-16 DIAGNOSIS — E038 Other specified hypothyroidism: Secondary | ICD-10-CM

## 2022-01-16 LAB — T4, FREE: Free T4: 2.26 ng/dL — ABNORMAL HIGH (ref 0.82–1.77)

## 2022-01-16 LAB — TSH: TSH: 0.11 u[IU]/mL — ABNORMAL LOW (ref 0.450–4.500)

## 2022-01-16 MED ORDER — LEVOTHYROXINE SODIUM 137 MCG PO TABS: ORAL_TABLET | ORAL | 1 refills | Status: DC

## 2022-01-18 ENCOUNTER — Ambulatory Visit: Payer: Commercial Managed Care - HMO | Admitting: Surgical

## 2022-01-19 ENCOUNTER — Other Ambulatory Visit: Payer: Self-pay | Admitting: Family Medicine

## 2022-01-21 NOTE — Telephone Encounter (Signed)
Duplicate request. Requested Prescriptions  Pending Prescriptions Disp Refills  . levothyroxine (SYNTHROID) 150 MCG tablet [Pharmacy Med Name: LEVOTHYROXINE 0.'150MG'$  (150MCG) TAB] 90 tablet     Sig: TAKE 1 TABLET BY MOUTH EVERY DAY BEFORE BREAKFAST     Endocrinology:  Hypothyroid Agents Failed - 01/19/2022  6:39 AM      Failed - TSH in normal range and within 360 days    TSH  Date Value Ref Range Status  01/15/2022 0.110 (L) 0.450 - 4.500 uIU/mL Final         Passed - Valid encounter within last 12 months    Recent Outpatient Visits          6 days ago Uterine leiomyoma, unspecified location   [place], Charlane Ferretti, MD   2 months ago Acute abdominal pain   [place], MD   6 months ago Atypical chest pain   [place], Charlane Ferretti, MD   11 months ago Skin lesions   [place], [name], MD   1 year ago [place], [name], MD      Future Appointments            In 1 month [place], [place], [facility], [facility]   In 5 months Charlott Rakes, MD [place]

## 2022-01-22 ENCOUNTER — Encounter (HOSPITAL_COMMUNITY): Payer: Self-pay

## 2022-01-22 ENCOUNTER — Other Ambulatory Visit: Payer: Self-pay

## 2022-01-22 ENCOUNTER — Emergency Department (HOSPITAL_COMMUNITY)
Admission: EM | Admit: 2022-01-22 | Discharge: 2022-01-22 | Disposition: A | Discharge status: Home / Self Care | Payer: Commercial Managed Care - HMO | Attending: Emergency Medicine | Admitting: Emergency Medicine

## 2022-01-22 ENCOUNTER — Emergency Department (HOSPITAL_COMMUNITY): Payer: Commercial Managed Care - HMO

## 2022-01-22 DIAGNOSIS — M25562 Pain in left knee: Secondary | ICD-10-CM | POA: Insufficient documentation

## 2022-01-22 DIAGNOSIS — Y92410 Unspecified street and highway as the place of occurrence of the external cause: Secondary | ICD-10-CM | POA: Diagnosis not present

## 2022-01-22 DIAGNOSIS — R0789 Other chest pain: Secondary | ICD-10-CM | POA: Insufficient documentation

## 2022-01-22 DIAGNOSIS — S161XXA Strain of muscle, fascia and tendon at neck level, initial encounter: Secondary | ICD-10-CM | POA: Diagnosis not present

## 2022-01-22 DIAGNOSIS — G8929 Other chronic pain: Secondary | ICD-10-CM | POA: Diagnosis not present

## 2022-01-22 DIAGNOSIS — M25561 Pain in right knee: Secondary | ICD-10-CM | POA: Diagnosis not present

## 2022-01-22 DIAGNOSIS — S39012A Strain of muscle, fascia and tendon of lower back, initial encounter: Secondary | ICD-10-CM

## 2022-01-22 DIAGNOSIS — S199XXA Unspecified injury of neck, initial encounter: Secondary | ICD-10-CM | POA: Diagnosis present

## 2022-01-22 LAB — I-STAT BETA HCG BLOOD, ED (MC, WL, AP ONLY): I-stat hCG, quantitative: <5 m[IU]/mL (ref <5)

## 2022-01-22 MED ORDER — CYCLOBENZAPRINE HCL 10 MG PO TABS
5.0000 mg | ORAL_TABLET | Freq: Once | ORAL | Status: AC
  Administered 2022-01-22: 5 mg via ORAL
  Filled 2022-01-22: qty 1

## 2022-01-22 MED ORDER — ONDANSETRON 4 MG PO TBDP: ORAL_TABLET | ORAL | 0 refills | Status: DC

## 2022-01-22 MED ORDER — IBUPROFEN 800 MG PO TABS
800.0000 mg | ORAL_TABLET | Freq: Once | ORAL | Status: AC
  Administered 2022-01-22: 800 mg via ORAL
  Filled 2022-01-22: qty 1

## 2022-01-22 MED ORDER — IBUPROFEN 800 MG PO TABS: 800.0000 mg | ORAL_TABLET | Freq: Three times a day (TID) | ORAL | 0 refills | Status: AC

## 2022-01-22 MED ORDER — CYCLOBENZAPRINE HCL 5 MG PO TABS: 5.0000 mg | ORAL_TABLET | Freq: Three times a day (TID) | ORAL | 0 refills | Status: DC | PRN

## 2022-01-22 MED ORDER — OXYCODONE HCL 5 MG PO TABS: 5.0000 mg | ORAL_TABLET | Freq: Four times a day (QID) | ORAL | 0 refills | Status: DC | PRN
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
## 2022-01-22 NOTE — Discharge Instructions (Signed)
As we discussed, your CT and x-rays did not show any fracture or bleeding  You likely have muscle strain.  Take Motrin for pain and Flexeril for muscle spasm  Take oxycodone for severe pain.  You had nausea vomiting from oxycodone previously so you can take some Zofran as needed  See your doctor for follow-up  Rest for 2 days  Return to ER if you have worse headache, neck pain, vomiting

## 2022-01-24 ENCOUNTER — Encounter: Payer: Self-pay | Admitting: Physical Medicine & Rehabilitation

## 2022-02-08 ENCOUNTER — Ambulatory Visit: Payer: Commercial Managed Care - HMO | Admitting: Surgical

## 2022-02-08 DIAGNOSIS — M1711 Unilateral primary osteoarthritis, right knee: Secondary | ICD-10-CM | POA: Diagnosis not present

## 2022-02-08 DIAGNOSIS — M1712 Unilateral primary osteoarthritis, left knee: Secondary | ICD-10-CM

## 2022-02-10 ENCOUNTER — Encounter: Payer: Self-pay | Admitting: Surgical

## 2022-02-10 MED ORDER — BUPIVACAINE HCL 0.25 % IJ SOLN
4.0000 mL | INTRAMUSCULAR | Status: AC | PRN
  Administered 2022-02-08: 4 mL via INTRA_ARTICULAR

## 2022-02-10 MED ORDER — LIDOCAINE HCL 1 % IJ SOLN
5.0000 mL | INTRAMUSCULAR | Status: AC | PRN
  Administered 2022-02-08: 5 mL

## 2022-02-10 MED ORDER — METHYLPREDNISOLONE ACETATE 40 MG/ML IJ SUSP
40.0000 mg | INTRAMUSCULAR | Status: AC | PRN
  Administered 2022-02-08: 40 mg via INTRA_ARTICULAR
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
## 2022-02-11 ENCOUNTER — Other Ambulatory Visit: Payer: Self-pay

## 2022-02-11 DIAGNOSIS — M1711 Unilateral primary osteoarthritis, right knee: Secondary | ICD-10-CM

## 2022-02-11 DIAGNOSIS — M1712 Unilateral primary osteoarthritis, left knee: Secondary | ICD-10-CM

## 2022-02-25 ENCOUNTER — Ambulatory Visit: Payer: Commercial Managed Care - HMO | Attending: Family Medicine | Admitting: Family Medicine

## 2022-02-25 ENCOUNTER — Other Ambulatory Visit: Payer: Self-pay | Admitting: Family Medicine

## 2022-02-25 ENCOUNTER — Encounter: Payer: Self-pay | Admitting: Family Medicine

## 2022-02-25 ENCOUNTER — Telehealth: Payer: Self-pay | Admitting: Licensed Clinical Social Worker

## 2022-02-25 VITALS — BP 125/89 | HR 67 | Temp 97.9°F | Ht 68.0 in | Wt 210.6 lb

## 2022-02-25 DIAGNOSIS — F32A Depression, unspecified: Secondary | ICD-10-CM

## 2022-02-25 DIAGNOSIS — R928 Other abnormal and inconclusive findings on diagnostic imaging of breast: Secondary | ICD-10-CM

## 2022-02-25 DIAGNOSIS — M2559 Pain in other specified joint: Secondary | ICD-10-CM | POA: Diagnosis not present

## 2022-02-25 DIAGNOSIS — F419 Anxiety disorder, unspecified: Secondary | ICD-10-CM

## 2022-02-25 DIAGNOSIS — F430 Acute stress reaction: Secondary | ICD-10-CM

## 2022-02-25 DIAGNOSIS — E7800 Pure hypercholesterolemia, unspecified: Secondary | ICD-10-CM

## 2022-02-25 DIAGNOSIS — E038 Other specified hypothyroidism: Secondary | ICD-10-CM

## 2022-02-25 MED ORDER — HYDROXYZINE HCL 25 MG PO TABS: 25.0000 mg | ORAL_TABLET | Freq: Two times a day (BID) | ORAL | 6 refills | Status: DC | PRN

## 2022-02-25 NOTE — Patient Instructions (Signed)
Managing Post-Traumatic Stress Disorder If you have been diagnosed with post-traumatic stress disorder (PTSD), you may be relieved that you now know why you have felt or behaved a certain way. Still, you may feel overwhelmed about the treatment ahead. You may also wonder how to get the support you need and how to deal with the condition day-to-day. If you are living with PTSD, there are ways to help you recover from it and manage your symptoms. How to manage lifestyle changes Managing stress Stress is your body's reaction to life changes and events, both good and bad. Stress can make PTSD worse. Take the following steps to manage stress: Talk with your health care provider or a counselor if you would like to learn more about techniques to reduce your stress. He or she may suggest some stress reduction techniques such as: Muscle relaxation exercises. Regular exercise. Meditation, yoga, or other mind-body exercises. Breathing exercises. Listening to quiet music. Spending time outside. Maintain a healthy lifestyle. Eat a healthy diet, exercise regularly, get plenty of sleep, and take time to relax. Spend time with others. Talk with them about how you are feeling and what kind of support you need. Try not to isolate yourself, even though you may feel like doing that. Isolating yourself can delay your recovery. Do activities and hobbies that you enjoy. Pace yourself when doing stressful things. Take breaks, and reward yourself when you finish. Make sure that you do not overload your schedule.  Medicines Your health care provider may suggest certain medicines if he or she feels that they will help to improve your condition. Medicines for depression (antidepressants) or severe loss of contact with reality (antipsychotics) may be used to treat PTSD. Avoid using alcohol and other substances that may prevent your medicines from working properly. It is also important to: Talk with your pharmacist or health  care provider about all medicines that you take, their possible side effects, and which medicines are safe to take together. Make it your goal to take part in all treatment decisions (shared decision-making). Ask about possible side effects of medicines that your health care provider recommends, and tell him or her how you feel about having those side effects. It is best to have shared decision-making with your health care provider as part of your total treatment plan. If your health care provider prescribes a medicine, you may not notice the full benefits of it for 4-8 weeks. Most people who are treated for PTSD need to take medicine for at least 6-12 months before they feel better. If you are taking medicines as part of your treatment, do not stop taking medicines before you ask your health care provider if it is safe to stop. You may need to have the medicine slowly decreased (tapered) over time to lower the risk of harmful side effects. Relationships Many people who have PTSD have difficulty trusting others. Make an effort to: Take risks and develop trust with close friends and family members. Developing trust in others can help you feel safe and connect you with emotional support. Be open and honest about your feelings. Have fun and relax in safe spaces, such as with friends and family. Think about going to couples counseling, family education classes, or family therapy. Your family members may not always know how to be supportive. Therapy can be helpful for everyone. How to recognize changes in your condition Be aware of your symptoms and how often you have them. The following symptoms mean that you need to get  help for your PTSD: You feel suspicious and angry. You have repeated flashbacks. You avoid going out or being with others. You have an increasing number of fights with close friends or family members. You have thoughts about hurting yourself or others. You cannot get relief from  feelings of depression or anxiety. Follow these instructions at home: Lifestyle Exercise regularly. Try to do 30 minutes or more of physical activity on most days of the week. Try to get 7-9 hours of sleep each night. To help with sleep: Keep your bedroom cool and dark. Avoid screen time before bedtime. This means avoiding use of your TV, computer, tablet, and mobile phone. Practice self-soothing skills and use them daily. Try to have fun and find humor in your life. Eating and drinking Do not eat a heavy meal during the hour before you go to bed. Do not drink alcohol or caffeinated drinks before bed. Avoid using alcohol or drugs. General instructions If your PTSD is affecting your marriage or family, get help from a family therapist. Remind yourself that recovering from the trauma is a process and takes time. Take over-the-counter and prescription medicines only as told by your health care provider. Make sure to let all of your health care providers know that you have PTSD. This is especially important if you are having surgery or need to be admitted to the hospital. Keep all follow-up visits. This is important. Where to find support Talking to others Explain that PTSD is a mental health problem. It is something that a person can develop after experiencing or seeing a traumatic event. Tell others that PTSD makes you feel stress like you did during the event. Talk to your family members and friends about the symptoms you have. Also, tell them what things or situations can cause symptoms to start (are triggers for you). Assure your family members that there are treatments to help PTSD. Discuss possibly getting family therapy or couples therapy. If you are worried or fearful about getting treatment, ask for support. Keep daily contact with at least one trusted friend or family member. Finances Not all insurance plans cover mental health care, so it is important to check with your insurance  carrier. If paying for co-pays or counseling services is a problem, search for a local or county mental health care center. Public mental health care services may be offered at those places at a low cost or no cost when you are not able to see a private health care provider. If you are a veteran, contact a local veterans organization or veterans hospital for more information. If you are taking medicine for PTSD, you may be able to get the genericform, which may be less expensive than brand-name medicine. Some makers of prescription medicines also offer help to people who cannot afford the medicines that they need. Therapy and support groups Find a support group in your community. Often, groups are available for [employer] veterans, trauma victims, and family members or caregivers. Look into volunteer opportunities. Taking part in these can help you feel more connected to your community. Contact a local organization to find out if you are eligible for a service dog. Where to find more information Go to these websites to find more information about PTSD, treatment of PTSD, and how to get support: [place]: https://carter.com/ National Center for PTSD: www.ptsd.PaintballBuzz.cz Contact a health care provider if: Your symptoms get worse or do not get better. You are feeling overwhelmed by your symptoms. Get help  right away if: You have thoughts about hurting yourself or others. Get help right away if you feel like you may hurt yourself or others, or have thoughts about taking your own life. Go to your nearest emergency room or: Call 911. Call the [place] at [phone] or 988. This is open 24 hours a day. Text the Crisis Text Line at (409)605-6137. Summary If you are living with PTSD, there are ways to help you recover from it and manage your symptoms. Find supportive environments and people who understand PTSD. Spend time in those places, and maintain contact  with those people. Work with your health care team to create a plan for managing PTSD. The plan should include counseling, stress reduction techniques, and healthy lifestyle habits. This information is not intended to replace advice given to you by your health care provider. Make sure you discuss any questions you have with your health care provider. Document Revised: 05/09/2021 Document Reviewed: 05/28/2021 Elsevier Patient Education  [place].
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
## 2022-02-26 ENCOUNTER — Other Ambulatory Visit: Payer: Self-pay | Admitting: Family Medicine

## 2022-02-26 DIAGNOSIS — E038 Other specified hypothyroidism: Secondary | ICD-10-CM

## 2022-02-26 LAB — TSH: TSH: 0.338 u[IU]/mL — ABNORMAL LOW (ref 0.450–4.500)

## 2022-02-26 LAB — LP+NON-HDL CHOLESTEROL
Cholesterol, Total: 150 mg/dL (ref 100–199)
HDL: 57 mg/dL (ref >39)
LDL Chol Calc (NIH): 75 mg/dL (ref 0–99)
Total Non-HDL-Chol (LDL+VLDL): 93 mg/dL (ref 0–129)
Triglycerides: 99 mg/dL (ref 0–149)
VLDL Cholesterol Cal: 18 mg/dL (ref 5–40)

## 2022-02-26 LAB — T3, FREE: T3, Free: 2.9 pg/mL (ref 2.0–4.4)

## 2022-02-26 LAB — T4, FREE: Free T4: 1.78 ng/dL — ABNORMAL HIGH (ref 0.82–1.77)

## 2022-02-26 MED ORDER — LEVOTHYROXINE SODIUM 125 MCG PO TABS: ORAL_TABLET | ORAL | 3 refills | Status: DC

## 2022-02-27 ENCOUNTER — Ambulatory Visit: Payer: Medicaid Other | Admitting: Physician Assistant

## 2022-03-05 ENCOUNTER — Encounter: Payer: Commercial Managed Care - HMO | Admitting: Physical Medicine & Rehabilitation

## 2022-03-22 ENCOUNTER — Ambulatory Visit: Payer: Commercial Managed Care - HMO | Admitting: Surgical

## 2022-03-22 DIAGNOSIS — M25461 Effusion, right knee: Secondary | ICD-10-CM

## 2022-03-25 ENCOUNTER — Ambulatory Visit: Payer: Commercial Managed Care - HMO | Admitting: Obstetrics and Gynecology

## 2022-03-26 ENCOUNTER — Inpatient Hospital Stay: Admission: RE | Admit: 2022-03-26 | Payer: Commercial Managed Care - HMO | Source: Ambulatory Visit

## 2022-03-26 ENCOUNTER — Encounter: Payer: Self-pay | Admitting: Surgical

## 2022-03-26 NOTE — Progress Notes (Signed)
Office Visit Note   Patient: Heidi Barrett           Date of Birth: 1966/04/21           MRN: [id number] Visit Date: 03/22/2022 Requested by: Charlott Rakes, MD [place],  [place] [id number] PCP: Charlott Rakes, MD  Subjective: Chief Complaint  Patient presents with   Right Knee - Pain, Follow-up   Left Knee - Pain, Follow-up    HPI: Heidi Barrett is a 56 y.o. female who presents to the office complaining of right knee pain.  Patient returns following cortisone injection notes administered to both knees at last visit about 6 weeks ago.  Had great relief until about 2 days ago when her symptoms recurred without injury.  Continues to complain of swelling.  Has occasional instability but the knee is not giving out on her.  Takes gabapentin and duloxetine and has stopped taking ibuprofen and tizanidine.  Most of her pain is in the medial aspect of the right knee but also has some lateral sided pain as well.  No groin pain.  No radicular pain..                ROS: All systems reviewed are negative as they relate to the chief complaint within the history of present illness.  Patient denies fevers or chills.  Assessment & Plan: Visit Diagnoses:  1. Effusion, right knee     Plan: Patient is a 56 year old female who presents for evaluation of primarily right knee pain.  Had good relief with cortisone injection for extended period of time but symptoms recurred several days ago with return of the same pain she has been experiencing.  She does have trace effusion on exam today as well as tenderness over the medial and lateral joint lines.  She has radiographs from last visit demonstrating very minimal joint space narrowing the medial compartment but otherwise unremarkable radiographs.  With lack of sustained relief from cortisone injection, plan for further evaluation with MRI of the right knee to evaluate for meniscal pathology versus occult arthritis versus any other  arthroscopically treatable pathology.  Follow-up after MRI to review results.  Follow-Up Instructions: No follow-ups on file.   Orders:  No orders of the defined types were placed in this encounter.  No orders of the defined types were placed in this encounter.     Procedures: No procedures performed   Clinical Data: No additional findings.  Objective: Vital Signs: There were no vitals taken for this visit.  Physical Exam:  Constitutional: Patient appears well-developed HEENT:  Head: Normocephalic Eyes:EOM are normal Neck: Normal range of motion Cardiovascular: Normal rate Pulmonary/chest: Effort normal Neurologic: Patient is alert Skin: Skin is warm Psychiatric: Patient has normal mood and affect  Ortho Exam: Ortho exam demonstrates right knee with trace effusion.  Tenderness over the medial and lateral joint lines.  Range of motion from 3 degrees extension to 120 degrees of knee flexion.  No calf tenderness.  Negative Homans' sign.  No pain with hip range of motion.  Able to perform straight leg raise without extensor lag.  No instability varus and valgus stress.  Stable to anterior and posterior drawer signs.  Specialty Comments:  No specialty comments available.  Imaging: No results found.   PMFS History: Patient Active Problem List   Diagnosis Date Noted   Palpitation 09/10/2017   Atypical chest pain 07/12/2016   Generalized anxiety disorder 07/04/2016   Right lower quadrant  abdominal pain 02/08/2016   Eye muscle twitches 02/08/2016   Fibroid, uterine 02/08/2016   Piriformis syndrome of right side 01/31/2015   Allergic reaction caused by a drug 01/27/2015   Heel spur 01/16/2015   Essential hypertension, benign 01/16/2015   Benign essential HTN 05/31/2014   Plantar fasciitis 05/31/2014   Other specified hypothyroidism 05/31/2014   Intramural leiomyoma of uterus 05/31/2014   Menorrhagia 01/04/2013   Fibroids 01/04/2013   Past Medical History:   Diagnosis Date   Anemia    Hypertension    Thyroid disease     Family History  Problem Relation Age of Onset   Heart disease Mother    Stroke Mother    Cancer Father        prostate    Past Surgical History:  Procedure Laterality Date   IR GENERIC HISTORICAL  05/28/2016   IR RADIOLOGIST EVAL & MGMT 05/28/2016 Markus Daft, MD [facility] INTER[facility] RAD   IR RADIOLOGIST EVAL & MGMT  03/06/2017   IR RADIOLOGIST EVAL & MGMT  02/17/2018   Social History   Occupational History   Not on file  Tobacco Use   Smoking status: Never   Smokeless tobacco: Never  Vaping Use   Vaping Use: Never used  Substance and Sexual Activity   Alcohol use: No   Drug use: No   Sexual activity: Never    Birth control/protection: None

## 2022-03-27 ENCOUNTER — Other Ambulatory Visit: Payer: Self-pay

## 2022-03-27 DIAGNOSIS — M25461 Effusion, right knee: Secondary | ICD-10-CM

## 2022-03-28 ENCOUNTER — Inpatient Hospital Stay: Admission: RE | Admit: 2022-03-28 | Payer: Commercial Managed Care - HMO | Source: Ambulatory Visit

## 2022-04-03 ENCOUNTER — Telehealth: Payer: Self-pay | Admitting: Family Medicine

## 2022-04-03 NOTE — Telephone Encounter (Signed)
Copied from [place] [id number]. Topic: Appointment Scheduling - Scheduling Inquiry for Clinic >> Apr 02, 2022  4:33 PM Ja-Kwan M wrote: Reason for CRM: Pt stated she has ptsd and would like to schedule an appt with the therapist or counseler. Cb# [phone]

## 2022-04-10 ENCOUNTER — Encounter: Payer: Commercial Managed Care - HMO | Admitting: Physical Medicine and Rehabilitation

## 2022-04-10 NOTE — Telephone Encounter (Signed)
LCSWA spoke with patient today and scheduled her an appointment with therapist for her anxiety, ptsd and life stressors around driving.

## 2022-04-13 ENCOUNTER — Ambulatory Visit
Admission: RE | Admit: 2022-04-13 | Discharge: 2022-04-13 | Disposition: A | Discharge status: Home / Self Care | Payer: Commercial Managed Care - HMO | Source: Ambulatory Visit | Attending: Surgical | Admitting: Surgical

## 2022-04-13 DIAGNOSIS — M25461 Effusion, right knee: Secondary | ICD-10-CM

## 2022-04-24 ENCOUNTER — Encounter: Payer: Commercial Managed Care - HMO | Admitting: Licensed Clinical Social Worker

## 2022-04-26 ENCOUNTER — Ambulatory Visit: Payer: Commercial Managed Care - HMO | Admitting: Surgical

## 2022-05-01 ENCOUNTER — Encounter
Payer: Commercial Managed Care - HMO | Attending: Physical Medicine and Rehabilitation | Admitting: Physical Medicine and Rehabilitation

## 2022-05-03 ENCOUNTER — Ambulatory Visit: Payer: Commercial Managed Care - HMO | Admitting: Surgical

## 2022-05-06 ENCOUNTER — Ambulatory Visit: Payer: Commercial Managed Care - HMO | Admitting: Physical Medicine & Rehabilitation

## 2022-05-20 ENCOUNTER — Encounter
Payer: Commercial Managed Care - HMO | Attending: Physical Medicine and Rehabilitation | Admitting: Physical Medicine and Rehabilitation

## 2022-05-20 ENCOUNTER — Ambulatory Visit: Payer: Commercial Managed Care - HMO | Admitting: Orthopedic Surgery

## 2022-06-03 ENCOUNTER — Ambulatory Visit: Payer: Commercial Managed Care - HMO | Admitting: Obstetrics and Gynecology

## 2022-06-20 ENCOUNTER — Other Ambulatory Visit: Payer: Self-pay | Admitting: Family Medicine

## 2022-06-20 DIAGNOSIS — Z789 Other specified health status: Secondary | ICD-10-CM

## 2022-06-20 DIAGNOSIS — E038 Other specified hypothyroidism: Secondary | ICD-10-CM

## 2022-06-20 NOTE — Telephone Encounter (Signed)
Requested medications are due for refill today.  yes  Requested medications are on the active medications list.  yes  Last refill. 02/26/2022 #30 2 rf  Future visit scheduled.   yes  Notes to clinic.  Abnormal labs     Requested Prescriptions  Pending Prescriptions Disp Refills   levothyroxine (SYNTHROID) 125 MCG tablet [Pharmacy Med Name: LEVOTHYROXINE 0.'125MG'$  (125MCG) TAB] 90 tablet     Sig: TAKE 1 TABLET BY MOUTH DAILY BEFORE BREAKFAST     Endocrinology:  Hypothyroid Agents Failed - 06/20/2022  5:20 PM      Failed - TSH in normal range and within 360 days    TSH  Date Value Ref Range Status  02/25/2022 0.338 (L) 0.450 - 4.500 uIU/mL Final         Passed - Valid encounter within last 12 months    Recent Outpatient Visits           3 months ago Hypercholesterolemia   [place], [name], MD   5 months ago Uterine leiomyoma, unspecified location   [place], [name], MD   7 months ago Acute abdominal pain   [place] Ladell Pier, MD   11 months ago Atypical chest pain   [place], [name], MD   1 year ago Skin lesions   [place], [name], MD       Future Appointments             In 4 weeks Charlott Rakes, MD [place]

## 2022-06-21 ENCOUNTER — Other Ambulatory Visit: Payer: Self-pay | Admitting: Family Medicine

## 2022-06-21 DIAGNOSIS — E038 Other specified hypothyroidism: Secondary | ICD-10-CM

## 2022-06-21 NOTE — Telephone Encounter (Signed)
Already ordered in another encounter, will refuse this request.  Requested Prescriptions  Pending Prescriptions Disp Refills   levothyroxine (SYNTHROID) 125 MCG tablet [Pharmacy Med Name: LEVOTHYROXINE 0.'125MG'$  (125MCG) TAB] 90 tablet     Sig: TAKE 1 TABLET BY MOUTH DAILY BEFORE BREAKFAST     Endocrinology:  Hypothyroid Agents Failed - 06/21/2022 10:04 AM      Failed - TSH in normal range and within 360 days    TSH  Date Value Ref Range Status  02/25/2022 0.338 (L) 0.450 - 4.500 uIU/mL Final         Passed - Valid encounter within last 12 months    Recent Outpatient Visits           3 months ago Hypercholesterolemia   [place], [name], MD   5 months ago Uterine leiomyoma, unspecified location   [place], [name], MD   7 months ago Acute abdominal pain   [place] Ladell Pier, MD   11 months ago Atypical chest pain   [place], [name], MD   1 year ago Skin lesions   [place], [name], MD       Future Appointments             In 3 weeks Charlott Rakes, MD [place]

## 2022-06-24 NOTE — Telephone Encounter (Signed)
Requested medication (s) are due for refill today: see note  Requested medication (s) are on the active medication list: yes  Last refill:  06/21/22 #30 0 refills  Future visit scheduled: yes in 3 weeks   Notes to clinic:  requesting #90 due to insurance will not cover #30 . Do you want to refill for #90?     Requested Prescriptions  Pending Prescriptions Disp Refills   levothyroxine (SYNTHROID) 125 MCG tablet 30 tablet 0    Sig: Take 1 tablet (125 mcg total) by mouth daily before breakfast.     Endocrinology:  Hypothyroid Agents Failed - 06/24/2022  4:42 PM      Failed - TSH in normal range and within 360 days    TSH  Date Value Ref Range Status  02/25/2022 0.338 (L) 0.450 - 4.500 uIU/mL Final         Passed - Valid encounter within last 12 months    Recent Outpatient Visits           3 months ago Hypercholesterolemia   [place], Charlane Ferretti, MD   5 months ago Uterine leiomyoma, unspecified location   [place], [name], MD   7 months ago Acute abdominal pain   [place] Ladell Pier, MD   11 months ago Atypical chest pain   [place], [name], MD   1 year ago Skin lesions   [place] Charlott Rakes, MD       Future Appointments             In 3 weeks Charlott Rakes, MD [place]            Refused Prescriptions Disp Refills   levothyroxine (SYNTHROID) 125 MCG tablet [Pharmacy Med Name: LEVOTHYROXINE 0.'125MG'$  (125MCG) TAB] 90 tablet     Sig: TAKE 1 TABLET BY MOUTH DAILY BEFORE BREAKFAST     Endocrinology:  Hypothyroid Agents Failed - 06/24/2022  4:42 PM      Failed - TSH in normal range and within 360 days    TSH  Date Value Ref Range Status  02/25/2022 0.338 (L) 0.450 - 4.500 uIU/mL Final         Passed - Valid encounter within last 12  months    Recent Outpatient Visits           3 months ago Hypercholesterolemia   [place], [name], MD   5 months ago Uterine leiomyoma, unspecified location   [place], [name], MD   7 months ago Acute abdominal pain   [place] Ladell Pier, MD   11 months ago Atypical chest pain   [place], [name], MD   1 year ago Skin lesions   [place], [name], MD       Future Appointments             In 3 weeks Charlott Rakes, MD [place]

## 2022-06-24 NOTE — Addendum Note (Signed)
Addended by: Cheral Almas on: 06/24/2022 04:42 PM   Modules accepted: Orders

## 2022-06-24 NOTE — Telephone Encounter (Signed)
Pt called and needs a 90 day supply of Levothyroxine instead of the 30 day supply that was sent / her insurance doesn't cover 30 day supply and will cover the 90 day supply at no charge / please advise and resend to [facility] on [address]

## 2022-06-25 MED ORDER — LEVOTHYROXINE SODIUM 125 MCG PO TABS: 125.0000 ug | ORAL_TABLET | Freq: Every day | ORAL | 0 refills | Status: DC

## 2022-06-25 NOTE — Addendum Note (Signed)
Addended by: Daisy Blossom, Annie Main L on: 06/25/2022 09:30 AM   Modules accepted: Orders

## 2022-07-05 ENCOUNTER — Other Ambulatory Visit: Payer: Self-pay | Admitting: Family Medicine

## 2022-07-05 ENCOUNTER — Telehealth: Payer: Self-pay | Admitting: *Deleted

## 2022-07-05 DIAGNOSIS — E7800 Pure hypercholesterolemia, unspecified: Secondary | ICD-10-CM

## 2022-07-05 NOTE — Progress Notes (Unsigned)
  Care Coordination  Outreach Note  07/05/2022 Name: NOELLE SEASE MRN: [id number] DOB: 1965/09/25   Care Coordination Outreach Attempts: An unsuccessful telephone outreach was attempted today to offer the patient information about available care coordination services as a benefit of their health plan.   Follow Up Plan:  Additional outreach attempts will be made to offer the patient care coordination information and services.   Encounter Outcome:  No Answer  [place]  Direct Dial: [phone]

## 2022-07-05 NOTE — Telephone Encounter (Signed)
Requested Prescriptions  Pending Prescriptions Disp Refills   atorvastatin (LIPITOR) 20 MG tablet [Pharmacy Med Name: ATORVASTATIN '20MG'$  TABLETS] 90 tablet 1    Sig: TAKE 1 TABLET(20 MG) BY MOUTH DAILY     Cardiovascular:  Antilipid - Statins Failed - 07/05/2022  3:08 AM      Failed - Lipid Panel in normal range within the last 12 months    Cholesterol, Total  Date Value Ref Range Status  02/25/2022 150 100 - 199 mg/dL Final   LDL Chol Calc (NIH)  Date Value Ref Range Status  02/25/2022 75 0 - 99 mg/dL Final   HDL  Date Value Ref Range Status  02/25/2022 57 >39 mg/dL Final   Triglycerides  Date Value Ref Range Status  02/25/2022 99 0 - 149 mg/dL Final         Passed - Patient is not pregnant      Passed - Valid encounter within last 12 months    Recent Outpatient Visits           4 months ago Hypercholesterolemia   [place], [name], MD   5 months ago Uterine leiomyoma, unspecified location   [place], Charlane Ferretti, MD   7 months ago Acute abdominal pain   [place] Ladell Pier, MD   1 year ago Atypical chest pain   [place], [name], MD   1 year ago Skin lesions   [place], [name], MD       Future Appointments             In 1 week Charlott Rakes, MD [place]

## 2022-07-05 NOTE — Telephone Encounter (Signed)
Patient stated in mychart message that she does need transportation.   Called and unable to leave vm for patient.

## 2022-07-09 NOTE — Progress Notes (Unsigned)
  Care Coordination  Outreach Note  07/09/2022 Name: Heidi Barrett MRN: [id number] DOB: Apr 23, 1966   Care Coordination Outreach Attempts: A second unsuccessful outreach was attempted today to offer the patient with information about available care coordination services as a benefit of their health plan.     Follow Up Plan:  Additional outreach attempts will be made to offer the patient care coordination information and services.   Encounter Outcome:  No Answer  [place]  Direct Dial: [phone]

## 2022-07-10 NOTE — Progress Notes (Signed)
  Care Coordination   Note   07/10/2022 Name: TIONA RUANE MRN: [id number] DOB: 02/19/1966  [name] is a 56 y.o. year old female who sees Charlott Rakes, MD for primary care. I reached out to [name] by phone today to offer care coordination services.  Ms. Nicki Reaper was given information about Care Coordination services today including:   The Care Coordination services include support from the care team which includes your Nurse Coordinator, Clinical Social Worker, or Pharmacist.  The Care Coordination team is here to help remove barriers to the health concerns and goals most important to you. Care Coordination services are voluntary, and the patient may decline or stop services at any time by request to their care team member.   Care Coordination Consent Status: Patient agreed to services and verbal consent obtained.   Follow up plan:  Telephone appointment with care coordination team member scheduled for:  07/17/22  Encounter Outcome:  Pt. Scheduled  [place]  Direct Dial: [phone]

## 2022-07-17 ENCOUNTER — Ambulatory Visit: Payer: Self-pay

## 2022-07-17 DIAGNOSIS — I10 Essential (primary) hypertension: Secondary | ICD-10-CM

## 2022-07-17 DIAGNOSIS — F411 Generalized anxiety disorder: Secondary | ICD-10-CM

## 2022-07-17 NOTE — Patient Outreach (Signed)
  Care Coordination   Initial Visit Note   07/17/2022 Name: Heidi Barrett MRN: [id number] DOB: 03/02/1966  Heidi Barrett is a 56 y.o. year old female who sees Charlott Rakes, MD for primary care. I spoke with  Heidi Barrett by phone today.  What matters to the patients health and wellness today?  Resource Education    Goals Addressed             This Visit's Progress    COMPLETED: Care Coordination Activities       Care Coordination Interventions: SDoH screening performed - identified challenges with transportation, food insecurity, and utility costs Discussed the patient lives with her mother who is unable to afford all living costs. Patient does not currently have any income but does have a hearing planned for 12/18 to determine if she is eligible for disability Determined the patient has recently applied for Medicaid and been approved for a Managed Medicaid plan [facility] Medicaid) Education provided to the patient regarding Managed Medicaid benefits Advised the patient SW will place a referral to the Managed Medicaid SW who will follow up with her to review all benefits and help with SDOH needs Performed chart review to note patient has an office visit scheduled for 12/14; patient reports she does not have transportation for this visit Encouraged the patient to contact her providers office to request the visit be switched to virtual or rescheduled to later in the month in order to utilize her health plan transportation benefit- patient stated understanding Referral placed to Managed Medicaid BSW who will contact the patient for ongoing care coordination needs         SDOH assessments and interventions completed:  Yes  SDOH Interventions Today    Flowsheet Row Most Recent Value  SDOH Interventions   Food Insecurity Interventions Other (Comment)  [Referral to Managed Medicaid SW]  Housing Interventions Intervention Not Indicated  Transportation Interventions Other (Comment)   [Referral to Managed Medicaid SW]  Utilities Interventions Other (Comment)  [referral to Managed Medicaid SW]        Care Coordination Interventions:  Yes, provided   Follow up plan: Referral made to Managed Medicaid team    Encounter Outcome:  Pt. Visit Completed   Daneen Schick, Arita Miss, CDP Social Worker, Certified Dementia Practitioner [facility]  Care Coordination [phone]

## 2022-07-17 NOTE — Patient Instructions (Signed)
Visit Information  Thank you for taking time to visit with me today. Please don't hesitate to contact me if I can be of assistance to you.   Following are the goals we discussed today:   Goals Addressed             This Visit's Progress    COMPLETED: Care Coordination Activities       Care Coordination Interventions: SDoH screening performed - identified challenges with transportation, food insecurity, and utility costs Discussed the patient lives with her mother who is unable to afford all living costs. Patient does not currently have any income but does have a hearing planned for 12/18 to determine if she is eligible for disability Determined the patient has recently applied for Medicaid and been approved for a Managed Medicaid plan [facility] [place] Medicaid) Education provided to the patient regarding Managed Medicaid benefits Advised the patient SW will place a referral to the Managed Medicaid SW who will follow up with her to review all benefits and help with SDOH needs Performed chart review to note patient has an office visit scheduled for 12/14; patient reports she does not have transportation for this visit Encouraged the patient to contact her providers office to request the visit be switched to virtual or rescheduled to later in the month in order to utilize her health plan transportation benefit- patient stated understanding Referral placed to Managed Medicaid BSW who will contact the patient for ongoing care coordination needs         If you are experiencing a Mental Health or [place] or need someone to talk to, please call 1-800-273-TALK (toll free, 24 hour hotline) call 911  Patient verbalizes understanding of instructions and care plan provided today and agrees to view in [place]. Active MyChart status and patient understanding of how to access instructions and care plan via MyChart confirmed with patient.     Daneen Schick, BSW, CDP Social Worker, Certified  Dementia Practitioner [place] [facility]  Care Coordination [phone]

## 2022-07-18 ENCOUNTER — Encounter: Payer: Self-pay | Admitting: Family Medicine

## 2022-07-18 ENCOUNTER — Ambulatory Visit: Payer: Commercial Managed Care - HMO | Attending: Family Medicine | Admitting: Family Medicine

## 2022-07-18 VITALS — BP 133/87 | HR 74 | Ht 68.0 in | Wt 223.2 lb

## 2022-07-18 DIAGNOSIS — E7800 Pure hypercholesterolemia, unspecified: Secondary | ICD-10-CM | POA: Diagnosis not present

## 2022-07-18 DIAGNOSIS — Z23 Encounter for immunization: Secondary | ICD-10-CM

## 2022-07-18 DIAGNOSIS — M5441 Lumbago with sciatica, right side: Secondary | ICD-10-CM

## 2022-07-18 DIAGNOSIS — I10 Essential (primary) hypertension: Secondary | ICD-10-CM

## 2022-07-18 DIAGNOSIS — N3941 Urge incontinence: Secondary | ICD-10-CM | POA: Insufficient documentation

## 2022-07-18 DIAGNOSIS — N3281 Overactive bladder: Secondary | ICD-10-CM

## 2022-07-18 DIAGNOSIS — E038 Other specified hypothyroidism: Secondary | ICD-10-CM

## 2022-07-18 DIAGNOSIS — M25561 Pain in right knee: Secondary | ICD-10-CM

## 2022-07-18 DIAGNOSIS — M5442 Lumbago with sciatica, left side: Secondary | ICD-10-CM

## 2022-07-18 DIAGNOSIS — G8929 Other chronic pain: Secondary | ICD-10-CM

## 2022-07-18 MED ORDER — METOPROLOL TARTRATE 50 MG PO TABS: 50.0000 mg | ORAL_TABLET | Freq: Two times a day (BID) | ORAL | 1 refills | Status: DC

## 2022-07-18 MED ORDER — GABAPENTIN 300 MG PO CAPS: 600.0000 mg | ORAL_CAPSULE | Freq: Every day | ORAL | 1 refills | Status: DC

## 2022-07-18 MED ORDER — MISC. DEVICES MISC: 0 refills | Status: AC

## 2022-07-18 MED ORDER — DULOXETINE HCL 60 MG PO CPEP: 60.0000 mg | ORAL_CAPSULE | Freq: Every day | ORAL | 1 refills | Status: DC

## 2022-07-18 MED ORDER — TOLTERODINE TARTRATE 2 MG PO TABS: 2.0000 mg | ORAL_TABLET | Freq: Two times a day (BID) | ORAL | 3 refills | Status: DC

## 2022-07-18 MED ORDER — AMLODIPINE BESYLATE 10 MG PO TABS: ORAL_TABLET | ORAL | 1 refills | Status: DC

## 2022-07-18 MED ORDER — ATORVASTATIN CALCIUM 20 MG PO TABS: ORAL_TABLET | ORAL | 1 refills | Status: DC
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
## 2022-07-18 NOTE — Patient Instructions (Signed)
Chronic Knee Pain, Adult Chronic knee pain is pain in one or both knees that lasts longer than 3 months. Symptoms of chronic knee pain may include swelling, stiffness, and discomfort. Age-related wear and tear (osteoarthritis) of the knee joint is the most common cause of chronic knee pain. Other possible causes include: A long-term immune-related disease that causes inflammation of the knee (rheumatoid arthritis). This usually affects both knees. Inflammatory arthritis, such as gout or pseudogout. An injury to the knee that causes arthritis. An injury to the knee that damages the ligaments. Ligaments are strong tissues that connect bones to each other. Runner's knee or pain behind the kneecap. Treatment for chronic knee pain depends on the cause. The main treatments for chronic knee pain are physical therapy and weight loss. This condition may also be treated with medicines, injections, a knee sleeve or brace, and by using crutches. Rest, ice, pressure (compression), and elevation, also known as RICE therapy, may also be recommended. Follow these instructions at home: If you have a knee sleeve or brace:  Wear the knee sleeve or brace as told by your health care provider. Remove it only as told by your health care provider. Loosen it if your toes tingle, become numb, or turn cold and blue. Keep it clean. If the sleeve or brace is not waterproof: Do not let it get wet. Remove it if allowed by your health care provider, or cover it with a watertight covering when you take a bath or a shower. Managing pain, stiffness, and swelling     If directed, apply heat to the affected area as often as told by your health care provider. Use the heat source that your health care provider recommends, such as a moist heat pack or a heating pad. If you have a removable knee sleeve or brace, remove it as told by your health care provider. Place a towel between your skin and the heat source. Leave the heat on for  20-30 minutes. Remove the heat if your skin turns bright red. This is especially important if you are unable to feel pain, heat, or cold. You may have a greater risk of getting burned. If directed, put ice on the affected area. To do this: If you have a removable knee sleeve or brace, remove it as told by your health care provider. Put ice in a plastic bag. Place a towel between your skin and the bag. Leave the ice on for 20 minutes, 2-3 times a day. Remove the ice if your skin turns bright red. This is very important. If you cannot feel pain, heat, or cold, you have a greater risk of damage to the area. Move your toes often to reduce stiffness and swelling. Raise (elevate) the injured area above the level of your heart while you are sitting or lying down. Activity Avoid high-impact activities or exercises, such as running, jumping rope, or doing jumping jacks. Follow the exercise plan that your health care provider designed for you. Your health care provider may suggest that you: Avoid activities that make knee pain worse. This may require you to change your exercise routines, sport participation, or job duties. Wear shoes with cushioned soles. Avoid sports that require running and sudden changes in direction. Do physical therapy. Physical therapy is planned to match your needs and abilities. It may include exercises for strength, flexibility, stability, and endurance. Do exercises that increase balance and strength, such as tai chi and yoga. Do not use the injured limb to support your   body weight until your health care provider says that you can. Use crutches as told by your health care provider. Return to your normal activities as told by your health care provider. Ask your health care provider what activities are safe for you. General instructions Take over-the-counter and prescription medicines only as told by your health care provider. Lose weight if you are overweight. Losing even a  little weight can reduce knee pain. Ask your health care provider what your ideal weight is, and how to safely lose extra weight. A dietitian may be able to help you plan your meals. Do not use any products that contain nicotine or tobacco, such as cigarettes, e-cigarettes, and chewing tobacco. These can delay healing. If you need help quitting, ask your health care provider. Keep all follow-up visits. This is important. Contact a health care provider if: You have knee pain that is not getting better or gets worse. You are unable to do your physical therapy exercises due to knee pain. Get help right away if: Your knee swells and the swelling becomes worse. You cannot move your knee. You have severe knee pain. Summary Knee pain that lasts more than 3 months is considered chronic knee pain. The main treatments for chronic knee pain are physical therapy and weight loss. You may also need to take medicines, wear a knee sleeve or brace, use crutches, and apply ice or heat. Losing even a little weight can reduce knee pain. Ask your health care provider what your ideal weight is, and how to safely lose extra weight. A dietitian may be able to help you plan your meals. Follow the exercise plan that your health care provider designed for you. This information is not intended to replace advice given to you by your health care provider. Make sure you discuss any questions you have with your health care provider. Document Revised: 01/05/2020 Document Reviewed: 01/05/2020 Elsevier Patient Education  [place].

## 2022-07-19 ENCOUNTER — Other Ambulatory Visit: Payer: Self-pay | Admitting: Family Medicine

## 2022-07-19 DIAGNOSIS — E038 Other specified hypothyroidism: Secondary | ICD-10-CM

## 2022-07-19 LAB — CMP14+EGFR
ALT: 40 IU/L — ABNORMAL HIGH (ref 0–32)
AST: 18 IU/L (ref 0–40)
Albumin/Globulin Ratio: 2 (ref 1.2–2.2)
Albumin: 4.7 g/dL (ref 3.8–4.9)
Alkaline Phosphatase: 74 IU/L (ref 44–121)
BUN/Creatinine Ratio: 10 (ref 9–23)
BUN: 11 mg/dL (ref 6–24)
Bilirubin Total: 0.5 mg/dL (ref 0.0–1.2)
CO2: 27 mmol/L (ref 20–29)
Calcium: 9.8 mg/dL (ref 8.7–10.2)
Chloride: 101 mmol/L (ref 96–106)
Creatinine, Ser: 1.1 mg/dL — ABNORMAL HIGH (ref 0.57–1.00)
Globulin, Total: 2.4 g/dL (ref 1.5–4.5)
Glucose: 82 mg/dL (ref 70–99)
Potassium: 4.1 mmol/L (ref 3.5–5.2)
Sodium: 142 mmol/L (ref 134–144)
Total Protein: 7.1 g/dL (ref 6.0–8.5)
eGFR: 60 mL/min/{1.73_m2} (ref >59)

## 2022-07-19 LAB — TSH: TSH: 1.34 u[IU]/mL (ref 0.450–4.500)

## 2022-07-19 LAB — T3: T3, Total: 104 ng/dL (ref 71–180)

## 2022-07-19 LAB — T4, FREE: Free T4: 1.77 ng/dL (ref 0.82–1.77)

## 2022-07-19 MED ORDER — LEVOTHYROXINE SODIUM 125 MCG PO TABS: 125.0000 ug | ORAL_TABLET | Freq: Every day | ORAL | 1 refills | Status: DC

## 2022-08-02 ENCOUNTER — Ambulatory Visit: Payer: Commercial Managed Care - HMO | Admitting: Surgical

## 2022-08-02 ENCOUNTER — Ambulatory Visit (INDEPENDENT_AMBULATORY_CARE_PROVIDER_SITE_OTHER): Payer: Commercial Managed Care - HMO | Admitting: Surgical

## 2022-08-02 ENCOUNTER — Ambulatory Visit (INDEPENDENT_AMBULATORY_CARE_PROVIDER_SITE_OTHER): Payer: Commercial Managed Care - HMO

## 2022-08-02 ENCOUNTER — Telehealth: Payer: Self-pay | Admitting: Orthopedic Surgery

## 2022-08-02 DIAGNOSIS — M25461 Effusion, right knee: Secondary | ICD-10-CM | POA: Diagnosis not present

## 2022-08-02 DIAGNOSIS — M25552 Pain in left hip: Secondary | ICD-10-CM

## 2022-08-02 DIAGNOSIS — M1711 Unilateral primary osteoarthritis, right knee: Secondary | ICD-10-CM

## 2022-08-02 DIAGNOSIS — M25551 Pain in right hip: Secondary | ICD-10-CM

## 2022-08-03 ENCOUNTER — Encounter: Payer: Self-pay | Admitting: Surgical

## 2022-08-03 MED ORDER — LIDOCAINE HCL 1 % IJ SOLN
5.0000 mL | INTRAMUSCULAR | Status: AC | PRN
  Administered 2022-08-02: 5 mL

## 2022-08-03 MED ORDER — METHYLPREDNISOLONE ACETATE 40 MG/ML IJ SUSP
40.0000 mg | INTRAMUSCULAR | Status: AC | PRN
  Administered 2022-08-02: 40 mg via INTRA_ARTICULAR

## 2022-08-03 MED ORDER — BUPIVACAINE HCL 0.25 % IJ SOLN
4.0000 mL | INTRAMUSCULAR | Status: AC | PRN
  Administered 2022-08-02: 4 mL via INTRA_ARTICULAR
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
## 2022-08-06 ENCOUNTER — Other Ambulatory Visit: Payer: Commercial Managed Care - HMO

## 2022-08-06 NOTE — Patient Instructions (Signed)
  Medicaid Managed Care     Visit Information  [name] was given information about Medicaid Managed Care team care coordination services as a part of their [place] Medicaid benefit. [name] verbally consented to engagement with the [facility] Managed Care team.   If you are experiencing a medical emergency, please call 911 or report to your local emergency department or urgent care.   If you have a non-emergency medical problem during routine business hours, please contact your provider's office and ask to speak with a nurse.   For questions related to your [facility], please call: [phone] or visit the homepage here: [email or web address]  If you would like to schedule transportation through your [facility], please call the following number at least 2 days in advance of your appointment: [phone]   Rides for urgent appointments can also be made after hours by calling Member Services.  Call the [place] at [phone], at any time, 24 hours a day, 7 days a week. If you are in danger or need immediate medical attention call 911.  If you would like help to quit smoking, call 1-800-QUIT-NOW (618)498-8706) OR Espaol: 1-855-Djelo-Ya (6-503-546-5681) o para ms informacin haga clic aqu or Text READY to 200-400 to register via text  [name] - following are the goals we discussed in your visit today:   Goals Addressed   None       Social Worker will follow up on 08/26/22.   Mickel Fuchs, BSW, [place] Managed Medicaid Team  [phone]   Following is a copy of your plan of care:  There are no care plans that you recently modified to display for this patient.

## 2022-08-06 NOTE — Patient Outreach (Signed)
Medicaid Managed Care Social Work Note  08/06/2022 Name:  Heidi Barrett MRN:  [id number] DOB:  [birth year]  Heidi Barrett is an 57 y.o. year old female who is a primary patient of Charlott Rakes, MD.  The Medicaid Managed Care Coordination team was consulted for assistance with:  Community Resources   [name] was given information about Medicaid Managed Care Coordination team services today. [name] Patient agreed to services and verbal consent obtained.  Engaged with patient  for by telephone forfollow up visit in response to referral for case management and/or care coordination services.   Assessments/Interventions:  Review of past medical history, allergies, medications, health status, including review of consultants reports, laboratory and other test data, was performed as part of comprehensive evaluation and provision of chronic care management services.  SDOH: (Social Determinant of Health) assessments and interventions performed: SDOH Interventions    Flowsheet Row Care Coordination from 07/17/2022 in [place] Office Visit from 02/25/2022 in [place] Interventions Other (Comment)  [Referral to Managed Medicaid SW] --  Housing Interventions Intervention Not Indicated --  Transportation Interventions Other (Comment)  [Referral to Managed Medicaid SW] --  Utilities Interventions Other (Comment)  [referral to Managed Medicaid SW] --  Depression Interventions/Treatment  -- Counseling     BSW completed a telephone outreach with patient. She states she is currently waiting for disability to approve her and does not have any income. She is living with her mom and mom is on a fixed income and is having to pay everything. They are looking to move or get assistance with their rent due to rent going up. Patient is receiving  foodstamps but is not enough. BSW will mail patient resources for food, rent, and affordable housing options.  Advanced Directives Status:  Not addressed in this encounter.  Care Plan                 Allergies  Allergen Reactions   Hctz [Hydrochlorothiazide] Shortness Of Breath and Other (See Comments)    wheezing   Robaxin [Methocarbamol] Swelling    Sensation of throat swelling/difficulty swallowing after taking this medication   Tramadol Anaphylaxis and Swelling   Hydrocodone Nausea And Vomiting    dizzines dizzines   Oxycodone-Acetaminophen Nausea And Vomiting    weakness   Percocet [Oxycodone-Acetaminophen] Nausea And Vomiting and Other (See Comments)    weakness    Medications Reviewed Today     Reviewed by Rosalin Hawking (Physician Assistant Certified) on [birth year] at 1634  Med List Status: <None>   Medication Order Taking? Sig Documenting Provider Last Dose Status Informant  albuterol (PROVENTIL HFA) 108 (90 Base) MCG/ACT inhaler [id number] No Inhale 2 puffs into the lungs every 4 (four) hours as needed for wheezing or shortness of breath.  Patient not taking: Reported on 07/18/2022   Charlott Rakes, MD Not Taking Active   amLODipine (NORVASC) 10 MG tablet [id number]  TAKE 1 TABLET(10 MG) BY MOUTH DAILY FOR BLOOD PRESSURE Charlott Rakes, MD  Active   atorvastatin (LIPITOR) 20 MG tablet [id number]  TAKE 1 TABLET(20 MG) BY MOUTH DAILY [name], [name], MD  Active   calcium carbonate (CALCIUM 600) 600 MG TABS tablet [id number] No Take 1 tablet (600 mg total) by mouth daily with breakfast. [name], MD Taking Active Self  cyclobenzaprine (FLEXERIL) 5 MG tablet [id number] No Take 1 tablet (5 mg  total) by mouth 3 (three) times daily as needed. [name], MD Taking Active   DULoxetine (CYMBALTA) 60 MG capsule [id number]  Take 1 capsule (60 mg total) by mouth daily. For chronic pain Charlott Rakes, MD  Active   EPINEPHrine 0.3 mg/0.3 mL IJ SOAJ injection  [id number] No Inject 0.3 mg into the muscle as needed for anaphylaxis. Charlott Rakes, MD Taking Active   fluticasone (FLONASE) 50 MCG/ACT nasal spray [id number] No SHAKE LIQUID AND USE 2 SPRAYS IN EACH NOSTRIL DAILY [name], [name], MD Taking Active   gabapentin (NEURONTIN) 300 MG capsule [id number]  Take 2 capsules (600 mg total) by mouth at bedtime. Charlott Rakes, MD  Active   hydrOXYzine (ATARAX) 25 MG tablet [id number] No Take 1 tablet (25 mg total) by mouth 2 (two) times daily as needed. TAKE 1 TABLET(25 MG) BY MOUTH AT BEDTIME Charlott Rakes, MD Taking Active   ibuprofen (ADVIL) 800 MG tablet [id number] No Take 1 tablet (800 mg total) by mouth 3 (three) times daily. [name], MD Taking Active   levothyroxine (SYNTHROID) 125 MCG tablet [id number]  Take 1 tablet (125 mcg total) by mouth daily before breakfast. Charlott Rakes, MD  Active   metoprolol tartrate (LOPRESSOR) 50 MG tablet [id number]  Take 1 tablet (50 mg total) by mouth 2 (two) times daily. Charlott Rakes, MD  Active   Misc. Devices MISC [id number]  4 pronged cane. Diagnosis right knee degenerative changes Charlott Rakes, MD  Active   Misc. Devices MISC [id number]  Rolling walker with seat. Diagnosis Right knee degenerative changes Charlott Rakes, MD  Active   Misc. Devices MISC [id number]  Depends, wipes, bed pads. Diagnosis : urinary incontinence. Charlott Rakes, MD  Active   Multiple Vitamin (MULTIVITAMIN WITH MINERALS) TABS tablet [id number] No Take 1 tablet by mouth daily. [name], MD Taking Active Self  tiZANidine (ZANAFLEX) 4 MG tablet [id number] No Take 1 tablet (4 mg total) by mouth 2 (two) times daily. Charlott Rakes, MD Taking Active   tolterodine (DETROL) 2 MG tablet [id number]  Take 1 tablet (2 mg total) by mouth 2 (two) times daily. Charlott Rakes, MD  Active             Patient Active Problem List   Diagnosis Date Noted   Urge incontinence of urine 07/18/2022   Palpitation 09/10/2017    Atypical chest pain 07/12/2016   Generalized anxiety disorder 07/04/2016   Right lower quadrant abdominal pain 02/08/2016   Eye muscle twitches 02/08/2016   Fibroid, uterine 02/08/2016   Piriformis syndrome of right side 01/31/2015   Allergic reaction caused by a drug 01/27/2015   Heel spur 01/16/2015   Essential hypertension, benign 01/16/2015   Benign essential HTN 05/31/2014   Plantar fasciitis 05/31/2014   Other specified hypothyroidism 05/31/2014   Intramural leiomyoma of uterus 05/31/2014   Menorrhagia 01/04/2013   Fibroids 01/04/2013    Conditions to be addressed/monitored per PCP order:   community resources  There are no care plans that you recently modified to display for this patient.   Follow up:  Patient agrees to Care Plan and Follow-up.  Plan: The Managed Medicaid care management team will reach out to the patient again over the next 14 days.  Date/time of next scheduled Social Work care management/care coordination outreach:  08/26/22  Mickel Fuchs, Arita Miss, [place] Medicaid Team  [phone]

## 2022-08-06 NOTE — Patient Outreach (Signed)
  Medicaid Managed Care   Unsuccessful Outreach Note  08/06/2022 Name: Heidi Barrett MRN: [id number] DOB: 01/26/1966  Referred by: Charlott Rakes, MD Reason for referral : No chief complaint on file.   An unsuccessful telephone outreach was attempted today. The patient was referred to the case management team for assistance with care management and care coordination.   Follow Up Plan: The care management team will reach out to the patient again over the next 7 days.   Mickel Fuchs, BSW, [place] Managed Medicaid Team  [phone]

## 2022-08-09 ENCOUNTER — Telehealth: Payer: Self-pay | Admitting: Family Medicine

## 2022-08-09 NOTE — Telephone Encounter (Signed)
Copied from [place] [phone]. Topic: General - Other >> Aug 09, 2022  8:17 AM Chapman Fitch wrote: Reason for CRM: [employer] sent docu sign document to Heidi Barrett's email/ it was viewed but not signed and returned / please sign and send back to [employer] / please advise

## 2022-08-09 NOTE — Telephone Encounter (Signed)
Order has been signed via docu-sign.

## 2022-08-21 ENCOUNTER — Ambulatory Visit: Payer: Commercial Managed Care - HMO

## 2022-08-26 ENCOUNTER — Other Ambulatory Visit: Payer: Commercial Managed Care - HMO

## 2022-08-26 NOTE — Patient Instructions (Signed)
08/26/2022 Name: Heidi Barrett MRN: [id number] DOB: [birth year]  Referred by: Charlott Rakes, MD Reason for referral : High Risk Managed Medicaid (Mm Social work unsuccessful telephone outreach )   An unsuccessful telephone outreach was attempted today. The patient was referred to the case management team for assistance with care management and care coordination.    Follow Up Plan: The  Patient has been provided with contact information for the Managed Medicaid care management team and has been advised to call with any health related questions or concerns.    Mickel Fuchs, BSW, [place] Managed Medicaid Team  [phone]

## 2022-08-26 NOTE — Patient Outreach (Signed)
  Medicaid Managed Care   Unsuccessful Outreach Note  08/26/2022 Name: Heidi Barrett MRN: [id number] DOB: 05/14/1966  Referred by: Charlott Rakes, MD Reason for referral : High Risk Managed Medicaid (Mm Social work unsuccessful telephone outreach )   An unsuccessful telephone outreach was attempted today. The patient was referred to the case management team for assistance with care management and care coordination.   Follow Up Plan: The patient has been provided with contact information for the care management team and has been advised to call with any health related questions or concerns.   Mickel Fuchs, BSW, [place] Managed Medicaid Team  [phone]

## 2022-08-27 ENCOUNTER — Ambulatory Visit: Payer: Commercial Managed Care - HMO

## 2022-09-03 ENCOUNTER — Other Ambulatory Visit: Payer: Commercial Managed Care - HMO

## 2022-09-04 ENCOUNTER — Other Ambulatory Visit: Payer: Commercial Managed Care - HMO

## 2022-09-04 ENCOUNTER — Ambulatory Visit: Payer: Commercial Managed Care - HMO | Admitting: Family Medicine

## 2022-09-05 ENCOUNTER — Other Ambulatory Visit: Payer: Commercial Managed Care - HMO

## 2022-09-05 ENCOUNTER — Ambulatory Visit: Payer: Commercial Managed Care - HMO | Admitting: Physical Therapy

## 2022-09-09 ENCOUNTER — Ambulatory Visit: Payer: Commercial Managed Care - HMO | Admitting: Physical Therapy

## 2022-09-13 ENCOUNTER — Other Ambulatory Visit: Payer: Self-pay | Admitting: Family Medicine

## 2022-09-13 DIAGNOSIS — I10 Essential (primary) hypertension: Secondary | ICD-10-CM

## 2022-09-13 NOTE — Telephone Encounter (Signed)
Requested Prescriptions  Refused Prescriptions Disp Refills   amLODipine (NORVASC) 10 MG tablet [Pharmacy Med Name: AMLODIPINE BESYLATE 10MG TABLETS] 90 tablet 1    Sig: TAKE 1 TABLET(10 MG) BY MOUTH DAILY FOR BLOOD PRESSURE     Cardiovascular: Calcium Channel Blockers 2 Passed - 09/13/2022  9:21 AM      Passed - Last BP in normal range    BP Readings from Last 1 Encounters:  07/18/22 133/87         Passed - Last Heart Rate in normal range    Pulse Readings from Last 1 Encounters:  07/18/22 74         Passed - Valid encounter within last 6 months    Recent Outpatient Visits           1 month ago Overactive bladder   [place], Charlane Ferretti, MD   6 months ago Hypercholesterolemia   [place], [name], MD   8 months ago Uterine leiomyoma, unspecified location   [place], [name], MD   10 months ago Acute abdominal pain   [place] Ladell Pier, MD   1 year ago Atypical chest pain   [place], [name], MD       Future Appointments             In 2 months Charlott Rakes, [place]

## 2022-09-15 ENCOUNTER — Inpatient Hospital Stay: Admission: RE | Admit: 2022-09-15 | Payer: Commercial Managed Care - HMO | Source: Ambulatory Visit

## 2022-09-18 ENCOUNTER — Ambulatory Visit: Payer: Commercial Managed Care - HMO | Admitting: Physical Therapy

## 2022-09-18 NOTE — Therapy (Deleted)
OUTPATIENT PHYSICAL THERAPY LOWER EXTREMITY EVALUATION   Patient Name: Heidi Barrett MRN: [id number] DOB:[birth year], 57 y.o., female Today's Date: 09/18/2022  END OF SESSION:   Past Medical History:  Diagnosis Date   Anemia    Hypertension    Thyroid disease    Past Surgical History:  Procedure Laterality Date   IR GENERIC HISTORICAL  05/28/2016   IR RADIOLOGIST EVAL & MGMT 05/28/2016 Markus Daft, MD [facility] INTERV RAD   IR RADIOLOGIST EVAL & MGMT  03/06/2017   IR RADIOLOGIST EVAL & MGMT  02/17/2018   Patient Active Problem List   Diagnosis Date Noted   Urge incontinence of urine 07/18/2022   Palpitation 09/10/2017   Atypical chest pain 07/12/2016   Generalized anxiety disorder 07/04/2016   Right lower quadrant abdominal pain 02/08/2016   Eye muscle twitches 02/08/2016   Fibroid, uterine 02/08/2016   Piriformis syndrome of right side 01/31/2015   Allergic reaction caused by a drug 01/27/2015   Heel spur 01/16/2015   Essential hypertension, benign 01/16/2015   Benign essential HTN 05/31/2014   Plantar fasciitis 05/31/2014   Other specified hypothyroidism 05/31/2014   Intramural leiomyoma of uterus 05/31/2014   Menorrhagia 01/04/2013   Fibroids 01/04/2013    PCP: Charlott Rakes, MD  REFERRING PROVIDER: Donella Stade, PA-C  REFERRING DIAG: [phone] (ICD-10-CM) - Effusion, right knee M17.11 (ICD-10-CM) - Unilateral primary osteoarthritis, right knee  THERAPY DIAG:  No diagnosis found.  Rationale for Evaluation and Treatment: Rehabilitation  ONSET DATE: ***  SUBJECTIVE:   SUBJECTIVE STATEMENT: ***  PERTINENT HISTORY: *** PAIN:  Are you having pain? {OPRCPAIN:27236}  PRECAUTIONS: {Therapy precautions:24002}  WEIGHT BEARING RESTRICTIONS: {Yes ***/No:24003}  FALLS:  Has patient fallen in last 6 months? {fallsyesno:27318}  LIVING ENVIRONMENT: Lives with: {OPRC lives with:25569::"lives with their family"} Lives in: {Lives in:25570} Stairs:  {opstairs:27293} Has following equipment at home: {Assistive devices:23999}  OCCUPATION: ***  PLOF: {PLOF:24004}  PATIENT GOALS: ***  NEXT MD VISIT: ***  OBJECTIVE:   DIAGNOSTIC FINDINGS: ***  PATIENT SURVEYS:  {rehab surveys:24030}  COGNITION: Overall cognitive status: {cognition:24006}     SENSATION: {sensation:27233}  EDEMA:  {edema:24020}  MUSCLE LENGTH: Hamstrings: Right *** deg; Left *** deg Thomas test: Right *** deg; Left *** deg  POSTURE: {posture:25561}  PALPATION: ***  LOWER EXTREMITY ROM:  {AROM/PROM:27142} ROM Right eval Left eval  Hip flexion    Hip extension    Hip abduction    Hip adduction    Hip internal rotation    Hip external rotation    Knee flexion    Knee extension    Ankle dorsiflexion    Ankle plantarflexion    Ankle inversion    Ankle eversion     (Blank rows = not tested)  LOWER EXTREMITY MMT:  MMT Right eval Left eval  Hip flexion    Hip extension    Hip abduction    Hip adduction    Hip internal rotation    Hip external rotation    Knee flexion    Knee extension    Ankle dorsiflexion    Ankle plantarflexion    Ankle inversion    Ankle eversion     (Blank rows = not tested)  LOWER EXTREMITY SPECIAL TESTS:  {LEspecialtests:26242}  FUNCTIONAL TESTS:  {Functional tests:24029}  GAIT: Distance walked: *** Assistive device utilized: {Assistive devices:23999} Level of assistance: {Levels of assistance:24026} Comments: ***   TODAY'S TREATMENT:  DATE: ***    PATIENT EDUCATION:  Education details: *** Person educated: {Person educated:25204} Education method: {Education Method:25205} Education comprehension: {Education Comprehension:25206}  HOME EXERCISE PROGRAM: ***  ASSESSMENT:  CLINICAL IMPRESSION: Patient is a *** y.o. *** who was seen today for physical therapy  evaluation and treatment for ***.   OBJECTIVE IMPAIRMENTS: {opptimpairments:25111}.   ACTIVITY LIMITATIONS: {activitylimitations:27494}  PARTICIPATION LIMITATIONS: {participationrestrictions:25113}  PERSONAL FACTORS: {Personal factors:25162} are also affecting patient's functional outcome.   REHAB POTENTIAL: {rehabpotential:25112}  CLINICAL DECISION MAKING: {clinical decision making:25114}  EVALUATION COMPLEXITY: {Evaluation complexity:25115}   GOALS: Goals reviewed with patient? {yes/no:20286}  SHORT TERM GOALS: Target date: *** *** Baseline: Goal status: {GOALSTATUS:25110}  2.  *** Baseline:  Goal status: {GOALSTATUS:25110}  3.  *** Baseline:  Goal status: {GOALSTATUS:25110}  4.  *** Baseline:  Goal status: {GOALSTATUS:25110}  5.  *** Baseline:  Goal status: {GOALSTATUS:25110}  6.  *** Baseline:  Goal status: {GOALSTATUS:25110}  LONG TERM GOALS: Target date: ***  *** Baseline:  Goal status: {GOALSTATUS:25110}  2.  *** Baseline:  Goal status: {GOALSTATUS:25110}  3.  *** Baseline:  Goal status: {GOALSTATUS:25110}  4.  *** Baseline:  Goal status: {GOALSTATUS:25110}  5.  *** Baseline:  Goal status: {GOALSTATUS:25110}  6.  *** Baseline:  Goal status: {GOALSTATUS:25110}   PLAN:  PT FREQUENCY: {rehab frequency:25116}  PT DURATION: {rehab duration:25117}  PLANNED INTERVENTIONS: {rehab planned interventions:25118::"Therapeutic exercises","Therapeutic activity","Neuromuscular re-education","Balance training","Gait training","Patient/Family education","Self Care","Joint mobilization"}  PLAN FOR NEXT SESSION: ***   Cranford Blessinger April Ma L Ishanvi Mcquitty, PT 09/18/2022, 11:06 AM

## 2022-09-23 ENCOUNTER — Other Ambulatory Visit: Payer: Self-pay | Admitting: Family Medicine

## 2022-09-23 DIAGNOSIS — F32A Depression, unspecified: Secondary | ICD-10-CM

## 2022-09-23 DIAGNOSIS — F430 Acute stress reaction: Secondary | ICD-10-CM

## 2022-09-23 NOTE — Telephone Encounter (Signed)
Medication Refill - Medication: hydrOXYzine (ATARAX) 25 MG tablet   Has the patient contacted their pharmacy? Yes  (Agent: If yes, when and what did the pharmacy advise?)  Preferred Pharmacy (with phone number or street name):   [place], [place] [facility] Phone: [phone]  Fax: [phone]     Has the patient been seen for an appointment in the last year OR does the patient have an upcoming appointment? Yes.    Agent: Please be advised that RX refills may take up to 3 business days. We ask that you follow-up with your pharmacy.

## 2022-09-23 NOTE — Telephone Encounter (Signed)
Patient checking on the status of medication refill request mentioned below. Patient states 2x daily. Patient will run out of medication in a few days and would like request expedited.   [facility] [place], [place] AT [facility] Phone: [phone]  Fax: [phone]

## 2022-09-24 NOTE — Telephone Encounter (Signed)
Unable to refill per protocol, Rx request is too soon. Last refill 02/25/22 for 60 and 6 refills.  Requested Prescriptions  Pending Prescriptions Disp Refills   hydrOXYzine (ATARAX) 25 MG tablet 60 tablet 6    Sig: Take 1 tablet (25 mg total) by mouth 2 (two) times daily as needed. TAKE 1 TABLET(25 MG) BY MOUTH AT BEDTIME     Ear, Nose, and Throat:  Antihistamines 2 Failed - 09/23/2022 11:31 AM      Failed - Cr in normal range and within 360 days    Creat  Date Value Ref Range Status  01/16/2015 0.82 0.50 - 1.10 mg/dL Final   Creatinine, Ser  Date Value Ref Range Status  07/18/2022 1.10 (H) 0.57 - 1.00 mg/dL Final         Passed - Valid encounter within last 12 months    Recent Outpatient Visits           2 months ago Overactive bladder   [place], [name], MD   7 months ago Hypercholesterolemia   [place], [name], MD   8 months ago Uterine leiomyoma, unspecified location   [place], [name], MD   10 months ago Acute abdominal pain   [place] Ladell Pier, MD   1 year ago Atypical chest pain   [place], [name], MD       Future Appointments             In 1 month Charlott Rakes, MD [place]

## 2022-09-26 ENCOUNTER — Emergency Department (HOSPITAL_COMMUNITY)
Admission: EM | Admit: 2022-09-26 | Discharge: 2022-09-27 | Disposition: A | Discharge status: Home / Self Care | Payer: Medicaid Other | Attending: Emergency Medicine | Admitting: Emergency Medicine

## 2022-09-26 ENCOUNTER — Emergency Department (HOSPITAL_COMMUNITY): Payer: Medicaid Other

## 2022-09-26 ENCOUNTER — Ambulatory Visit: Payer: Self-pay | Admitting: *Deleted

## 2022-09-26 ENCOUNTER — Other Ambulatory Visit: Payer: Self-pay

## 2022-09-26 ENCOUNTER — Encounter (HOSPITAL_COMMUNITY): Payer: Self-pay

## 2022-09-26 DIAGNOSIS — R42 Dizziness and giddiness: Secondary | ICD-10-CM | POA: Insufficient documentation

## 2022-09-26 DIAGNOSIS — E039 Hypothyroidism, unspecified: Secondary | ICD-10-CM | POA: Insufficient documentation

## 2022-09-26 DIAGNOSIS — Z79899 Other long term (current) drug therapy: Secondary | ICD-10-CM | POA: Diagnosis not present

## 2022-09-26 DIAGNOSIS — E871 Hypo-osmolality and hyponatremia: Secondary | ICD-10-CM | POA: Diagnosis not present

## 2022-09-26 DIAGNOSIS — I10 Essential (primary) hypertension: Secondary | ICD-10-CM | POA: Insufficient documentation

## 2022-09-26 DIAGNOSIS — Z7951 Long term (current) use of inhaled steroids: Secondary | ICD-10-CM | POA: Diagnosis not present

## 2022-09-26 DIAGNOSIS — R739 Hyperglycemia, unspecified: Secondary | ICD-10-CM | POA: Diagnosis not present

## 2022-09-26 DIAGNOSIS — J45909 Unspecified asthma, uncomplicated: Secondary | ICD-10-CM | POA: Diagnosis not present

## 2022-09-26 DIAGNOSIS — R079 Chest pain, unspecified: Secondary | ICD-10-CM | POA: Diagnosis present

## 2022-09-26 LAB — I-STAT CHEM 8, ED
BUN: 11 mg/dL (ref 6–20)
Calcium, Ion: 1.12 mmol/L — ABNORMAL LOW (ref 1.15–1.40)
Chloride: 101 mmol/L (ref 98–111)
Creatinine, Ser: 1.1 mg/dL — ABNORMAL HIGH (ref 0.44–1.00)
Glucose, Bld: 102 mg/dL — ABNORMAL HIGH (ref 70–99)
HCT: 36 % (ref 36.0–46.0)
Hemoglobin: 12.2 g/dL (ref 12.0–15.0)
Potassium: 4.2 mmol/L (ref 3.5–5.1)
Sodium: 136 mmol/L (ref 135–145)
TCO2: 26 mmol/L (ref 22–32)

## 2022-09-26 LAB — URINALYSIS, ROUTINE W REFLEX MICROSCOPIC
Bacteria, UA: NONE SEEN
Bilirubin Urine: NEGATIVE
Glucose, UA: NEGATIVE mg/dL
Hgb urine dipstick: NEGATIVE
Ketones, ur: NEGATIVE mg/dL
Leukocytes,Ua: NEGATIVE
Nitrite: NEGATIVE
Protein, ur: NEGATIVE mg/dL
Specific Gravity, Urine: 1.011 (ref 1.005–1.030)
pH: 6 (ref 5.0–8.0)

## 2022-09-26 LAB — CBC
HCT: 36.7 % (ref 36.0–46.0)
Hemoglobin: 12.8 g/dL (ref 12.0–15.0)
MCH: 30.8 pg (ref 26.0–34.0)
MCHC: 34.9 g/dL (ref 30.0–36.0)
MCV: 88.2 fL (ref 80.0–100.0)
Platelets: 134 10*3/uL — ABNORMAL LOW (ref 150–400)
RBC: 4.16 MIL/uL (ref 3.87–5.11)
RDW: 12.6 % (ref 11.5–15.5)
WBC: 4.9 10*3/uL (ref 4.0–10.5)
nRBC: 0 % (ref 0.0–0.2)

## 2022-09-26 LAB — COMPREHENSIVE METABOLIC PANEL
ALT: 30 U/L (ref 0–44)
AST: 19 U/L (ref 15–41)
Albumin: 3.4 g/dL — ABNORMAL LOW (ref 3.5–5.0)
Alkaline Phosphatase: 46 U/L (ref 38–126)
Anion gap: 6 (ref 5–15)
BUN: 10 mg/dL (ref 6–20)
CO2: 26 mmol/L (ref 22–32)
Calcium: 8.6 mg/dL — ABNORMAL LOW (ref 8.9–10.3)
Chloride: 101 mmol/L (ref 98–111)
Creatinine, Ser: 0.98 mg/dL (ref 0.44–1.00)
GFR, Estimated: >60 mL/min (ref >60)
Glucose, Bld: 110 mg/dL — ABNORMAL HIGH (ref 70–99)
Potassium: 4.1 mmol/L (ref 3.5–5.1)
Sodium: 133 mmol/L — ABNORMAL LOW (ref 135–145)
Total Bilirubin: 0.7 mg/dL (ref 0.3–1.2)
Total Protein: 6.2 g/dL — ABNORMAL LOW (ref 6.5–8.1)

## 2022-09-26 LAB — TROPONIN I (HIGH SENSITIVITY): Troponin I (High Sensitivity): <2 ng/L (ref <18)

## 2022-09-26 MED ORDER — MECLIZINE HCL 12.5 MG PO TABS: 12.5000 mg | ORAL_TABLET | Freq: Three times a day (TID) | ORAL | 0 refills | Status: DC | PRN

## 2022-09-26 MED ORDER — LACTATED RINGERS IV BOLUS
500.0000 mL | Freq: Once | INTRAVENOUS | Status: AC
  Administered 2022-09-26: 500 mL via INTRAVENOUS

## 2022-09-26 MED ORDER — MECLIZINE HCL 25 MG PO TABS
25.0000 mg | ORAL_TABLET | Freq: Once | ORAL | Status: AC
  Administered 2022-09-26: 25 mg via ORAL
  Filled 2022-09-26: qty 1

## 2022-09-26 NOTE — Telephone Encounter (Signed)
Patient in ED.

## 2022-09-26 NOTE — ED Triage Notes (Signed)
Pt BIB [facility] from home d/t mid sternal CP that started 2 days ago & worsened this morning. She also felt dizzy & clammy was 172/110, 12 L good. Was given ASA while en route & 1 Nitro, after the nitro she was 90/50, 48 bpm, was then given 500 NS in 18g Rt AC PIV & her BP became normotensive after, A/Ox4.

## 2022-09-26 NOTE — ED Provider Triage Note (Signed)
Emergency Medicine Provider Triage Evaluation Note  Heidi Barrett , a 57 y.o. female  was evaluated in triage.  Pt complains of chest pain and dizziness, described as spinning sensation.  Both are intermittent.  Review of Systems  Positive: Chest pain, vertiginous symptoms, nausea Negative: Leg swelling, near fainting or fainting spell  Physical Exam  BP 124/85 (BP Location: Right Arm)   Pulse 61   Temp 97.8 F (36.6 C)   Resp 16   Ht 5' 2"$  (1.575 m)   Wt 96.6 kg   SpO2 98%   BMI 38.96 kg/m  Gen:   Awake, no distress   Resp:  Normal effort  MSK:   Moves extremities without difficulty  Other:  Bidirectional horizontal nystagmus  Medical Decision Making  Medically screening exam initiated at 1:27 PM.  Appropriate orders placed.  [name] was informed that the remainder of the evaluation will be completed by another provider, this initial triage assessment does not replace that evaluation, and the importance of remaining in the ED until their evaluation is complete.     Varney Biles, MD 09/26/22 1329

## 2022-09-26 NOTE — Telephone Encounter (Signed)
Reason for Disposition  SEVERE dizziness (vertigo) (e.g., unable to walk without assistance)  Answer Assessment - Initial Assessment Questions 1. DESCRIPTION: "Describe your dizziness."     I'm dizzy I can't even sleep.   When I go to the bathroom.   The room keeps spinning.    I need a shower seat.   I'm having nausea too.   Only person to assist me is my mother. This is new   Happening for 2 days.    I don't know what it is. Every time I try to get up the room is spinning.   I feel nausea. 2. VERTIGO: "Do you feel like either you or the room is spinning or tilting?"      Yes the room is spinning. I have a sore in my left ear.   3. LIGHTHEADED: "Do you feel lightheaded?" (e.g., somewhat faint, woozy, weak upon standing)     No     Just the room is spinning around and having nausea.   I will need transportation at the moment.    4. SEVERITY: "How bad is it?"  "Can you walk?"   - MILD: Feels slightly dizzy and unsteady, but is walking normally.   - MODERATE: Feels unsteady when walking, but not falling; interferes with normal activities (e.g., school, work).   - SEVERE: Unable to walk without falling, or requires assistance to walk without falling.     I have to hold onto something to keep me stable.   I'm using a roller walker with this dizziness.     5. ONSET:  "When did the dizziness begin?"     2 days ago.    I woke up and the room was spinning.   6. AGGRAVATING FACTORS: "Does anything make it worse?" (e.g., standing, change in head position)     Moving around 7. CAUSE: "What do you think is causing the dizziness?"     I don't know 8. RECURRENT SYMPTOM: "Have you had dizziness before?" If Yes, ask: "When was the last time?" "What happened that time?"     No 9. OTHER SYMPTOMS: "Do you have any other symptoms?" (e.g., headache, weakness, numbness, vomiting, earache)     Nausea.   I had an earache on the right side but it's fine now.    The left ear has sores and scabs in it.    They keep  coming back the sores on my left ear.     No ear drainage.   I can tell my hearing is decreased.    I had a headache a few days ago.   10. PREGNANCY: "Is there any chance you are pregnant?" "When was your last menstrual period?"       Not asked  Protocols used: Dizziness - Vertigo-A-AH  Chief Complaint: Severe dizziness and nausea Symptoms: Unable to walk without holding onto something for balance.  Not lightheaded but the room is spinning constantly even in her sleep.   Can't sleep due to the spinning.   Never had this before. Frequency: Going on for 2 days now. Pertinent Negatives: Patient denies passing out or lightheadedness. Disposition: [x]$ ED /[]$ Urgent Care (no appt availability in office) / []$ Appointment(In office/virtual)/ []$  [place] [facility]/ []$ Home Care/ []$ Refused Recommended Disposition /[]$ [place] [facility]/ []$  Follow-up with PCP Additional Notes: I have referred this pt to the ED.   She doesn't have transportation so was agreeable to calling 911 to take her to [place] sent to  Dr. [name] at [facility].

## 2022-09-26 NOTE — ED Provider Notes (Signed)
[facility] Provider Note   CSN: [id number] Arrival date & time: 09/26/22  1223     History Chief Complaint  Patient presents with   Chest Pain    Heidi Barrett is a 57 y.o. female with history of torn retina, hypertension, asthma, hypothyroidism, anxiety, depression, arthritis ambulatory with cane at baseline, presents the ER for evaluation of dizziness/room spinning sensation off-and-on for the past 2 to 3 days.  The patient reports that it is worse whenever she changes position or moves her head.  She denies any diplopia or any blurry vision worse than her baseline.  She reports that she has been rinsing sensation a few times a day.  She started having some chest pain after the room spinning sensation started.  She reports it is mainly central.  Today she started to feel some shortness of breath that came with the dizziness when she stood up to go to the bathroom.  That concerned her so she came into the emergency department for evaluation.  She denies any fever, cough or cold symptoms, abdominal pain, vomiting, diarrhea, weakness on one side of her body, trouble talking, facial droop.  Patient reports that he does have some nausea during the room sensation as well.  Patient is also on multiple medications including gabapentin, tizanidine, duloxetine, metoprolol, Synthroid, and amlodipine.  Chest Pain Associated symptoms: dizziness, nausea and shortness of breath   Associated symptoms: no abdominal pain, no cough, no fever, no headache, no palpitations, no vomiting and no weakness        Home Medications Prior to Admission medications   Medication Sig Start Date End Date Taking? Authorizing Provider  albuterol (PROVENTIL HFA) 108 (90 Base) MCG/ACT inhaler Inhale 2 puffs into the lungs every 4 (four) hours as needed for wheezing or shortness of breath. Patient not taking: Reported on 07/18/2022 10/07/18   Charlott Rakes, MD   amLODipine (NORVASC) 10 MG tablet TAKE 1 TABLET(10 MG) BY MOUTH DAILY FOR BLOOD PRESSURE 07/18/22   Charlott Rakes, MD  atorvastatin (LIPITOR) 20 MG tablet TAKE 1 TABLET(20 MG) BY MOUTH DAILY 07/18/22   Charlott Rakes, MD  calcium carbonate (CALCIUM 600) 600 MG TABS tablet Take 1 tablet (600 mg total) by mouth daily with breakfast. 05/31/14   [name], MD  cyclobenzaprine (FLEXERIL) 5 MG tablet Take 1 tablet (5 mg total) by mouth 3 (three) times daily as needed. 01/22/22   [name], MD  DULoxetine (CYMBALTA) 60 MG capsule Take 1 capsule (60 mg total) by mouth daily. For chronic pain 07/18/22   Charlott Rakes, MD  EPINEPHrine 0.3 mg/0.3 mL IJ SOAJ injection Inject 0.3 mg into the muscle as needed for anaphylaxis. 01/15/22   Charlott Rakes, MD  fluticasone (FLONASE) 50 MCG/ACT nasal spray SHAKE LIQUID AND USE 2 SPRAYS IN EACH NOSTRIL DAILY 01/15/22   Charlott Rakes, MD  gabapentin (NEURONTIN) 300 MG capsule Take 2 capsules (600 mg total) by mouth at bedtime. 07/18/22   Charlott Rakes, MD  hydrOXYzine (ATARAX) 25 MG tablet Take 1 tablet (25 mg total) by mouth 2 (two) times daily as needed. TAKE 1 TABLET(25 MG) BY MOUTH AT BEDTIME 02/25/22   Charlott Rakes, MD  ibuprofen (ADVIL) 800 MG tablet Take 1 tablet (800 mg total) by mouth 3 (three) times daily. 01/22/22   [name], MD  levothyroxine (SYNTHROID) 125 MCG tablet Take 1 tablet (125 mcg total) by mouth daily before breakfast. 07/19/22   Charlott Rakes, MD  metoprolol tartrate (LOPRESSOR) 50 MG tablet Take 1 tablet (50 mg total) by mouth 2 (two) times daily. 07/18/22   Charlott Rakes, MD  Misc. Devices MISC 4 pronged cane. Diagnosis right knee degenerative changes 07/18/22   Charlott Rakes, MD  Misc. Devices MISC Rolling walker with seat. Diagnosis Right knee degenerative changes 07/18/22   Charlott Rakes, MD  Misc. Devices MISC Depends, wipes, bed pads. Diagnosis : urinary incontinence. 07/18/22   Charlott Rakes,  MD  Multiple Vitamin (MULTIVITAMIN WITH MINERALS) TABS tablet Take 1 tablet by mouth daily. 05/31/14   [name], MD  tiZANidine (ZANAFLEX) 4 MG tablet Take 1 tablet (4 mg total) by mouth 2 (two) times daily. 01/15/22   Charlott Rakes, MD  tolterodine (DETROL) 2 MG tablet Take 1 tablet (2 mg total) by mouth 2 (two) times daily. 07/18/22   Charlott Rakes, MD      Allergies    Hctz [hydrochlorothiazide], Robaxin [methocarbamol], Tramadol, Hydrocodone, Oxycodone-acetaminophen, and Percocet [oxycodone-acetaminophen]    Review of Systems   Review of Systems  Constitutional:  Negative for chills and fever.  HENT:  Negative for congestion, rhinorrhea and sore throat.   Eyes:  Negative for visual disturbance.  Respiratory:  Positive for shortness of breath. Negative for cough.   Cardiovascular:  Positive for chest pain. Negative for palpitations.  Gastrointestinal:  Positive for nausea. Negative for abdominal pain, constipation, diarrhea and vomiting.  Genitourinary:  Negative for dysuria and hematuria.  Neurological:  Positive for dizziness. Negative for syncope, speech difficulty, weakness and headaches.    Physical Exam Updated Vital Signs BP (!) 157/89 (BP Location: Right Arm)   Pulse (!) 55   Temp 98 F (36.7 C) (Oral)   Resp 15   Ht '5\' 2"'$  (1.575 m)   Wt 96.6 kg   SpO2 100%   BMI 38.96 kg/m  Physical Exam Constitutional:      General: She is not in acute distress.    Appearance: She is not toxic-appearing.  Eyes:     Extraocular Movements: Extraocular movements intact.     Pupils: Pupils are equal, round, and reactive to light.  Cardiovascular:     Rate and Rhythm: Normal rate.  Pulmonary:     Effort: Pulmonary effort is normal. No respiratory distress.     Breath sounds: No decreased breath sounds.  Abdominal:     Palpations: Abdomen is soft.     Tenderness: There is no abdominal tenderness. There is no guarding or rebound.  Musculoskeletal:     Cervical  back: Normal range of motion.     Right lower leg: No edema.     Left lower leg: No edema.     Comments: Pain with range of motion of the right leg.  I do not appreciate any swelling.  Compartments are soft.  Patient reports this is chronic for her.  Skin:    General: Skin is warm and dry.  Neurological:     Mental Status: She is alert and oriented to person, place, and time.     GCS: GCS eye subscore is 4. GCS verbal subscore is 5. GCS motor subscore is 6.     Cranial Nerves: No cranial nerve deficit, dysarthria or facial asymmetry.     Sensory: No sensory deficit.     Motor: No weakness or pronator drift.     Coordination: Finger-Nose-Finger Test normal.     Gait: Gait normal.     Comments: Patient alert and oriented.  GCS 15.  Cranial nerves  II through XII intact.  Strength intact in patient's upper and lower bilateral extremities.  She does have some minimal decreased strength in her right lower extremity but that is secondary to pain.  She reports that this is at her baseline.  Sensations intact.  She is answering questions appropriately with appropriate speech.  No facial droop noted.  Ambulatory with and without her cane and is able to turn stably.     ED Results / Procedures / Treatments   Labs (all labs ordered are listed, but only abnormal results are displayed) Labs Reviewed  COMPREHENSIVE METABOLIC PANEL - Abnormal; Notable for the following components:      Result Value   Sodium 133 (*)    Glucose, Bld 110 (*)    Calcium 8.6 (*)    Total Protein 6.2 (*)    Albumin 3.4 (*)    All other components within normal limits  CBC - Abnormal; Notable for the following components:   Platelets 134 (*)    All other components within normal limits  I-STAT CHEM 8, ED - Abnormal; Notable for the following components:   Creatinine, Ser 1.10 (*)    Glucose, Bld 102 (*)    Calcium, Ion 1.12 (*)    All other components within normal limits  URINALYSIS, ROUTINE W REFLEX MICROSCOPIC   TROPONIN I (HIGH SENSITIVITY)    EKG EKG Interpretation  Date/Time:  Thursday September 26 2022 12:34:46 EST Ventricular Rate:  64 PR Interval:  170 QRS Duration: 100 QT Interval:  424 QTC Calculation: 437 R Axis:   -2 Text Interpretation: Normal sinus rhythm Minimal voltage criteria for LVH, may be normal variant ( R in aVL ) Borderline ECG When compared with ECG of 04-Jul-2021 12:01, PREVIOUS ECG IS PRESENT No sig changes Confirmed by Octaviano Glow [phone]) on 09/26/2022 7:36:06 PM  Radiology DG Chest 1 View  Result Date: 09/26/2022 CLINICAL DATA:  Two day history of chest pain, shortness of breath, dizziness, and headache EXAM: CHEST  1 VIEW COMPARISON:  Chest radiograph dated 01/22/2022 FINDINGS: Normal lung volumes. No focal consolidations. No pleural effusion or pneumothorax. Enlarged cardiomediastinal silhouette. The visualized skeletal structures are unremarkable. IMPRESSION: 1.  No focal consolidations. 2. New cardiomegaly compared to 01/22/2022. Electronically Signed   By: Darrin Nipper M.D.   On: 09/26/2022 14:01    Procedures Procedures   Medications Ordered in ED Medications  meclizine (ANTIVERT) tablet 25 mg (25 mg Oral Given 09/26/22 2031)  lactated ringers bolus 500 mL (500 mLs Intravenous New Bag/Given 09/26/22 2031)    ED Course/ Medical Decision Making/ A&P                           Medical Decision Making Amount and/or Complexity of Data Reviewed Labs: ordered.   57 year old female presents emerged from today for evaluation of room spinning sensation with some chest pain and shortness of breath.  Differential diagnosis includes but is not limited to WITHOUT auditory symptoms: lasting seconds (positioning vertigo (cupulolithiasis), vertebrobasilar insufficiency, cervical vertigo (head-extension vertigo), diplopia); drug-related (anticonvulsants, antibiotics, hypnotics, analgesics, alcohol).  Vital signs show blood pressure 147/98, afebrile, normal pulse rate,  satting well on room air without increased work of breathing.  Physical exam as noted above.  Labs initiated in triage.  I did add on a troponin as the patient initially came in here with some chest pain and one was not ordered before. Suspect vertigo. Will order Meclizine and IV fluids.   I  independently reviewed and interpreted the patient's labs.  CMP shows mild hyponatremia 133.  Mildly elevated glucose at 110.  Mildly decreased calcium, protein, and albumin otherwise no electrolyte or LFT abnormality.  CBC without leukocytosis or anemia.  Platelets at 134 which appears to be around her baseline.  Urine does not show any signs of infection.  Troponin less than 2.  Chest x-ray shows new cardiomegaly since June however the chest x-ray she got today was an AP form not PA form.  Could be the reason they are seeing cardiomegaly.  Regardless, she does not have any new EKG changes and her troponins unremarkable.  She was given ambulatory referral to cardiology.  EKG reviewed and interpreted by attending and shows Normal sinus rhythm Minimal voltage criteria for LVH, may be normal variant ( R in aVL ) Borderline ECG When compared with ECG of 04-Jul-2021 12:01, PREVIOUS ECG IS PRESENT No sig changes.  The patient reports that she feels better after the medication. She was able to ambulate from laying to standing position without assistance and with a steady gait. She was able to use her cane, but is also ambulatory without it. I doubt any CVA as she does not have any focal neuro deficit. I about any ACS given negative troponin and unchanged EKG. Her dizziness was worse with position and with head movement. This is likely peripheral vertigo. I doubt any central vertigo.   I discussed with her to see her PCP for medication management. Will send her home with some meclizine as she has relief of symptoms with that. We discussed following up with cardiology. Discussed her labs and imaging. We discussed strict  return precautions and red flag symptoms. The patient verbalized her understanding and agrees to the plan.    Final Clinical Impression(s) / ED Diagnoses Final diagnoses:  Vertigo  Nonspecific chest pain    Rx / DC Orders ED Discharge Orders          Ordered    meclizine (ANTIVERT) 12.5 MG tablet  3 times daily PRN        09/26/22 2358    Ambulatory referral to Cardiology        09/26/22 2358              Sherrell Puller, PA-C 09/27/22 1407    Wyvonnia Dusky, MD 09/27/22 [phone]

## 2022-09-26 NOTE — ED Notes (Signed)
Pt able to ambulate with steady gait and with no assistance. MD notified

## 2022-09-26 NOTE — Discharge Instructions (Addendum)
You were seen in the ER for evaluation of your lightheadedness and room-spinning. I'm glad that this improved with some medication. I would like for you to follow up with a cardiologist for your chest pain. I am going to send you in a prescription for the medication that you got today that helped you. Additionally, I think your other medication may be causing these symptoms. Please follow up with your PCP for medication management. I have attached additional information about your symptoms/diagnosis, please read. If you have any concerns, new or worsening symptoms, please return to the ER for re-evaluation.   Contact a health care provider if: You have a fever. Your condition gets worse or you develop new symptoms. Your family or friends notice any behavioral changes. You have nausea or vomiting that gets worse. You have numbness or a prickling and tingling sensation. Get help right away if you: Have difficulty speaking or moving. Are always dizzy or faint. Develop severe headaches. Have weakness in your legs or arms. Have changes in your hearing or vision. Develop a stiff neck. Develop sensitivity to light. These symptoms may represent a serious problem that is an emergency. Do not wait to see if the symptoms will go away. Get medical help right away. Call your local emergency services (911 in the [place] Do not drive yourself to the hospital.

## 2022-09-27 DIAGNOSIS — R079 Chest pain, unspecified: Secondary | ICD-10-CM | POA: Diagnosis not present

## 2022-09-27 LAB — TROPONIN I (HIGH SENSITIVITY): Troponin I (High Sensitivity): <2 ng/L (ref <18)

## 2022-10-03 ENCOUNTER — Ambulatory Visit: Payer: Self-pay

## 2022-10-03 ENCOUNTER — Other Ambulatory Visit: Payer: Self-pay

## 2022-10-03 NOTE — Telephone Encounter (Addendum)
  Chief Complaint: needs refill of the Meclizine and Durable medical equipment Disposition: '[]'$ ED /'[]'$ Urgent Care (no appt availability in office) / '[]'$ Appointment(In office/virtual)/ '[]'$  [facility]/ '[]'$ Home Care/ '[]'$ Refused Recommended Disposition /'[]'$ [facility]/ '[x]'$  Follow-up with PCP Additional Notes: pt was given a 5 day supply on 09/26/22. Pt was seen in ED that day for vertigo and chest pain. Pt has a f/u with PCP on 10/17/22. Asking for hospital bed, shower chair, scooter, portable toilet  Reason for Disposition  Prescription request for new medicine (not a refill)  Answer Assessment - Initial Assessment Questions 1. DRUG NAME: "What medicine do you need to have refilled?"     Meclizine 2. REFILLS REMAINING: "How many refills are remaining?" (Note: The label on the medicine or pill bottle will show how many refills are remaining. If there are no refills remaining, then a renewal may be needed.)     no 3. EXPIRATION DATE: "What is the expiration date?" (Note: The label states when the prescription will expire, and thus can no longer be refilled.)     N/a 4. PRESCRIBING HCP: "Who prescribed it?" Reason: If prescribed by specialist, call should be referred to that group.     Hospital at discharge 5. SYMPTOMS: "Do you have any symptoms?"     Vertigo has upcoming f/u visit   Pt requesting shower chair, scooter, hospital bed, portable toilet  Protocols used: Medication Refill and Renewal Call-A-AH

## 2022-10-03 NOTE — Telephone Encounter (Signed)
Was dc'd from hospital on 09/26/22 (#15 no refills) with a 5 day refill of Meclizine.Needs refill to last until hospital f/u. No refill protocol.  Requested Prescriptions  Pending Prescriptions Disp Refills   meclizine (ANTIVERT) 12.5 MG tablet 15 tablet 0    Sig: Take 1 tablet (12.5 mg total) by mouth 3 (three) times daily as needed for dizziness.     There is no refill protocol information for this order

## 2022-10-08 ENCOUNTER — Other Ambulatory Visit: Payer: Self-pay | Admitting: Family Medicine

## 2022-10-08 ENCOUNTER — Encounter (HOSPITAL_BASED_OUTPATIENT_CLINIC_OR_DEPARTMENT_OTHER): Payer: Self-pay | Admitting: Cardiology

## 2022-10-08 ENCOUNTER — Ambulatory Visit (INDEPENDENT_AMBULATORY_CARE_PROVIDER_SITE_OTHER): Payer: Commercial Managed Care - HMO | Admitting: Cardiology

## 2022-10-08 VITALS — BP 130/88 | HR 71 | Ht 68.0 in | Wt 231.1 lb

## 2022-10-08 DIAGNOSIS — Z712 Person consulting for explanation of examination or test findings: Secondary | ICD-10-CM

## 2022-10-08 DIAGNOSIS — R079 Chest pain, unspecified: Secondary | ICD-10-CM | POA: Diagnosis not present

## 2022-10-08 DIAGNOSIS — I10 Essential (primary) hypertension: Secondary | ICD-10-CM | POA: Diagnosis not present

## 2022-10-08 DIAGNOSIS — Z7189 Other specified counseling: Secondary | ICD-10-CM | POA: Diagnosis not present

## 2022-10-08 DIAGNOSIS — Z8249 Family history of ischemic heart disease and other diseases of the circulatory system: Secondary | ICD-10-CM

## 2022-10-08 NOTE — Patient Instructions (Addendum)
Medication Instructions:  TAKE AN EXTRA METOPROLOL 50 MG 2 HOURS PRIOR TO CARDIAC CT   Labwork: BMET 1 WEEK PRIOR TO CARDIAC CT   Testing/Procedures: Your physician has requested that you have cardiac CT. Cardiac computed tomography (CT) is a painless test that uses an x-ray machine to take clear, detailed pictures of your heart. For further information please visit [email or web address]. Please follow instruction sheet as given.  Follow-Up: 4-6 WEEKS WITH DR Harrell Gave   Any Other Special Instructions Will Be Listed Below (If Applicable).    Your cardiac CT will be scheduled at one of the below locations:   [facility] [address] [place], [place] [address] (336) [place] [address] [place], [place] [address] [phone]  [facility] [place], [place] [address] [phone]  If scheduled at [facility], please arrive at the [facility] and Children's Entrance (Entrance C2) of [facility] 30 minutes prior to test start time. You can use the FREE valet parking offered at entrance C (encouraged to control the heart rate for the test)  Proceed to the [facility] Radiology Department (first floor) to check-in and test prep.  All radiology patients and guests should use entrance C2 at [facility], accessed from [facility], even though the hospital's physical address listed is [address]    If scheduled at [facility] or [facility], please arrive 15 mins early for check-in and test prep.   Please follow these instructions carefully (unless otherwise directed):  Hold all erectile dysfunction medications at least 3 days (72 hrs) prior to test. (Ie viagra, cialis, sildenafil, tadalafil, etc) We will administer nitroglycerin during this exam.   On the Night  Before the Test: Be sure to Drink plenty of water. Do not consume any caffeinated/decaffeinated beverages or chocolate 12 hours prior to your test. Do not take any antihistamines 12 hours prior to your test.  On the Day of the Test: Drink plenty of water until 1 hour prior to the test. Do not eat any food 1 hour prior to test. You may take your regular medications prior to the test.  Take metoprolol (Lopressor) two hours prior to test. If you take Furosemide/Hydrochlorothiazide/Spironolactone, please HOLD on the morning of the test.      After the Test: Drink plenty of water. After receiving IV contrast, you may experience a mild flushed feeling. This is normal. On occasion, you may experience a mild rash up to 24 hours after the test. This is not dangerous. If this occurs, you can take Benadryl 25 mg and increase your fluid intake. If you experience trouble breathing, this can be serious. If it is severe call 911 IMMEDIATELY. If it is mild, please call our office. If you take any of these medications: Glipizide/Metformin, Avandament, Glucavance, please do not take 48 hours after completing test unless otherwise instructed.  We will call to schedule your test 2-4 weeks out understanding that some insurance companies will need an authorization prior to the service being performed.   For non-scheduling related questions, please contact the cardiac imaging nurse navigator should you have any questions/concerns: Marchia Bond, Cardiac Imaging Nurse Navigator Gordy Clement, Cardiac Imaging Nurse Navigator [facility] Direct Office Dial: [phone]   For scheduling needs, including cancellations and rescheduling, please call [place], [phone].

## 2022-10-08 NOTE — Progress Notes (Signed)
Cardiology Office Note:    Date:  10/09/2022   ID:  Heidi Barrett- Carrie Mew, DOB [birth year], MRN [id number]  PCP:  Charlott Rakes, MD  Cardiologist:  Buford Dresser, MD  Referring MD: Charlott Rakes, MD   CC: new patient consultation for chest pain  History of Present Illness:    Heidi Barrett is a 57 y.o. female with a hx of hypertension who is seen as a new consult at the request of Charlott Rakes, MD for the evaluation and management of chest pain.  ER note from 09/26/22 reviewed. Presented with vertigo and nonspecific chest pain. ECG was NSR. HsTN <2, <2. After event, referred to cardiology by her PCP.  Today: Has intermittent chest pain, mild, dull. Having at the visit today. Feels up to a few times/day. Has been going on for about 2 weeks. Occurred at the same time vertigo started. Has had nausea at the same time.   Has been trying to exercise, knows goal is 30 min 5 times a week. Limited in the last few weeks due to vertigo. Afraid to shower/bathe due to dizzy spells.   Has not had menses in about a year.   Reports history of heart murmur, was on medication for it years ago, no longer on meds.  Reports heart cath in [place] years ago, went through groin, was worried about rhythm issue, ?EP study vs coronary angiography  Mother had CAD s/p CABGx7. Father with enlarged heart, hypertension. All of her siblings are on blood pressure medication.  Denies shortness of breath at rest or with normal exertion. No PND, orthopnea, LE edema or unexpected weight gain. No syncope or palpitations.   Past Medical History:  Diagnosis Date   Anemia    Hypertension    Thyroid disease     Past Surgical History:  Procedure Laterality Date   IR GENERIC HISTORICAL  05/28/2016   IR RADIOLOGIST EVAL & MGMT 05/28/2016 Markus Daft, MD [facility] INTERV RAD   IR RADIOLOGIST EVAL & MGMT  03/06/2017   IR RADIOLOGIST EVAL & MGMT  02/17/2018    Current Medications: Current  Outpatient Medications on File Prior to Visit  Medication Sig   albuterol (PROVENTIL HFA) 108 (90 Base) MCG/ACT inhaler Inhale 2 puffs into the lungs every 4 (four) hours as needed for wheezing or shortness of breath.   amLODipine (NORVASC) 10 MG tablet TAKE 1 TABLET(10 MG) BY MOUTH DAILY FOR BLOOD PRESSURE   atorvastatin (LIPITOR) 20 MG tablet TAKE 1 TABLET(20 MG) BY MOUTH DAILY   calcium carbonate (CALCIUM 600) 600 MG TABS tablet Take 1 tablet (600 mg total) by mouth daily with breakfast.   cyclobenzaprine (FLEXERIL) 5 MG tablet Take 1 tablet (5 mg total) by mouth 3 (three) times daily as needed.   DULoxetine (CYMBALTA) 60 MG capsule Take 1 capsule (60 mg total) by mouth daily. For chronic pain   EPINEPHrine 0.3 mg/0.3 mL IJ SOAJ injection Inject 0.3 mg into the muscle as needed for anaphylaxis.   fluticasone (FLONASE) 50 MCG/ACT nasal spray SHAKE LIQUID AND USE 2 SPRAYS IN EACH NOSTRIL DAILY   gabapentin (NEURONTIN) 300 MG capsule Take 2 capsules (600 mg total) by mouth at bedtime.   hydrOXYzine (ATARAX) 25 MG tablet Take 1 tablet (25 mg total) by mouth 2 (two) times daily as needed. TAKE 1 TABLET(25 MG) BY MOUTH AT BEDTIME   ibuprofen (ADVIL) 800 MG tablet Take 1 tablet (800 mg total) by mouth 3 (three) times daily.   levothyroxine (SYNTHROID) 125 MCG  tablet Take 1 tablet (125 mcg total) by mouth daily before breakfast.   meclizine (ANTIVERT) 12.5 MG tablet Take 1 tablet (12.5 mg total) by mouth 3 (three) times daily as needed for dizziness.   metoprolol tartrate (LOPRESSOR) 50 MG tablet Take 1 tablet (50 mg total) by mouth 2 (two) times daily.   Misc. Devices MISC 4 pronged cane. Diagnosis right knee degenerative changes   Misc. Devices MISC Rolling walker with seat. Diagnosis Right knee degenerative changes   Misc. Devices MISC Depends, wipes, bed pads. Diagnosis : urinary incontinence.   Multiple Vitamin (MULTIVITAMIN WITH MINERALS) TABS tablet Take 1 tablet by mouth daily.   tiZANidine  (ZANAFLEX) 4 MG tablet Take 1 tablet (4 mg total) by mouth 2 (two) times daily.   tolterodine (DETROL) 2 MG tablet Take 1 tablet (2 mg total) by mouth 2 (two) times daily.   No current facility-administered medications on file prior to visit.     Allergies:   Hctz [hydrochlorothiazide], Robaxin [methocarbamol], Tramadol, Hydrocodone, Oxycodone-acetaminophen, and Percocet [oxycodone-acetaminophen]   Social History   Tobacco Use   Smoking status: Never   Smokeless tobacco: Never  Vaping Use   Vaping Use: Never used  Substance Use Topics   Alcohol use: No   Drug use: No    Family History: family history includes Cancer in her father; Heart disease in her mother; Stroke in her mother.  ROS:   Please see the history of present illness.  Additional pertinent ROS: Constitutional: Negative for chills, fever, night sweats, unintentional weight loss  HENT: Negative for ear pain and hearing loss.   Eyes: Negative for loss of vision and eye pain.  Respiratory: Negative for cough, sputum, wheezing.   Cardiovascular: See HPI. Gastrointestinal: Negative for abdominal pain, melena, and hematochezia.  Genitourinary: Negative for dysuria and hematuria.  Musculoskeletal: Negative for falls and myalgias.  Skin: Negative for itching and rash.  Neurological: Negative for focal weakness, focal sensory changes and loss of consciousness.  Endo/Heme/Allergies: Does not bruise/bleed easily.     EKGs/Labs/Other Studies Reviewed:    The following studies were reviewed today: ETT 25-Oct-2015 Blood pressure demonstrated a hypertensive response to exercise. There was no ST segment deviation noted during stress. No T wave inversion was noted during stress. Overall, the patient's exercise capacity was excellent. The patient experienced no angina during the stress test. [facility] Treadmill Score: low risk   Negative stress test without evidence of ischemia at given workload. Hypertensive response to  exercise.  EKG:  EKG is personally reviewed.   10/08/22: not ordered today 09/26/22 (ER)  Recent Labs: 07/18/2022: TSH 1.340 09/26/2022: ALT 30; BUN 11; Creatinine, Ser 1.10; Hemoglobin 12.2; Platelets 134; Potassium 4.2; Sodium 136  Recent Lipid Panel    Component Value Date/Time   CHOL 150 02/25/2022 1038   TRIG 99 02/25/2022 1038   HDL 57 02/25/2022 1038   CHOLHDL 2.4 10/26/2020 0957   CHOLHDL 2.6 01/16/2015 1041   VLDL 9 01/16/2015 1041   LDLCALC 75 02/25/2022 1038    Physical Exam:    VS:  BP 130/88 (BP Location: Left Arm, Patient Position: Sitting, Cuff Size: Large)   Pulse 71   Ht '5\' 8"'$  (1.727 m)   Wt 231 lb 1.6 oz (104.8 kg)   SpO2 99%   BMI 35.14 kg/m     Wt Readings from Last 3 Encounters:  10/08/22 231 lb 1.6 oz (104.8 kg)  09/26/22 213 lb (96.6 kg)  07/18/22 223 lb 3.2 oz (101.2 kg)  GEN: Well nourished, well developed in no acute distress HEENT: Normal, moist mucous membranes NECK: No JVD CARDIAC: regular rhythm, normal S1 and S2, no rubs or gallops. No murmur. VASCULAR: Radial and DP pulses 2+ bilaterally. No carotid bruits RESPIRATORY:  Clear to auscultation without rales, wheezing or rhonchi  ABDOMEN: Soft, non-tender, non-distended MUSCULOSKELETAL:  Ambulates independently SKIN: Warm and dry, no edema NEUROLOGIC:  Alert and oriented x 3. No focal neuro deficits noted. PSYCHIATRIC:  Normal affect    ASSESSMENT:    1. Chest pain of uncertain etiology   2. Essential hypertension, benign   3. Encounter to discuss test results   4. Cardiac risk counseling   5. Counseling on health promotion and disease prevention   6. Family history of heart disease    PLAN:    Chest pain Family history of heart disease -discussed treadmill stress, nuclear stress/lexiscan, and CT coronary angiography. Discussed pros and cons of each, including but not limited to false positive/false negative risk, radiation risk, and risk of IV contrast dye. Based on shared  decision making, decision was made to pursue CT coronary angiography. -will give one time dose of metoprolol 2 hours prior to scheduled test -counseled on need to get BMET prior to test -counseled on use of sublingual nitroglycerin and its importance to a good test -reviewed red flag warning signs that need immediate medical attention  -had prior normal stress test -reviewed ER tests, hsTn reassuring  Hypertension: better today compared to recent ER visit  Cardiac risk counseling and prevention recommendations: -recommend heart healthy/Mediterranean diet, with whole grains, fruits, vegetable, fish, lean meats, nuts, and olive oil. Limit salt. -recommend moderate walking, 3-5 times/week for 30-50 minutes each session. Aim for at least 150 minutes.week. Goal should be pace of 3 miles/hours, or walking 1.5 miles in 30 minutes -recommend avoidance of tobacco products. Avoid excess alcohol. -ASCVD risk score: The 10-year ASCVD risk score (Arnett DK, et al., 2019) is: 3.6%   Values used to calculate the score:     Age: 77 years     Sex: Female     Is Non-Hispanic African American: Yes     Diabetic: No     Tobacco smoker: No     Systolic Blood Pressure: [id number]     Is BP treated: Yes     HDL Cholesterol: 57 mg/dL     Total Cholesterol: 150 mg/dL    Plan for follow up: 4-6 weeks  Buford Dresser, MD, PhD, [facility] at [facility] [address], [place], [place] [address] [phone]   Medication Adjustments/Labs and Tests Ordered: Current medicines are reviewed at length with the patient today.  Concerns regarding medicines are outlined above.  Orders Placed This Encounter  Procedures   CT CORONARY MORPH W/CTA COR W/SCORE W/CA W/CM &/OR WO/CM   Basic metabolic panel   No orders of the defined types were placed in this encounter.   Patient Instructions  Medication  Instructions:  TAKE AN EXTRA METOPROLOL 50 MG 2 HOURS PRIOR TO CARDIAC CT   Labwork: BMET 1 WEEK PRIOR TO CARDIAC CT   Testing/Procedures: Your physician has requested that you have cardiac CT. Cardiac computed tomography (CT) is a painless test that uses an x-ray machine to take clear, detailed pictures of your heart. For further information please visit [email or web address]. Please follow instruction sheet as given.  Follow-Up: 4-6 WEEKS WITH DR Harrell Gave  Any Other Special Instructions Will Be Listed Below (If Applicable).    Your cardiac CT will be scheduled at one of the below locations:   [facility] [address] [place], [place] [address] (336) [place] [address] [place], [place] [address] [phone]  [facility] [place], [place] [address] [phone]  If scheduled at [facility], please arrive at the [facility] and Children's Entrance (Entrance C2) of [facility] 30 minutes prior to test start time. You can use the FREE valet parking offered at entrance C (encouraged to control the heart rate for the test)  Proceed to the [facility] Radiology Department (first floor) to check-in and test prep.  All radiology patients and guests should use entrance C2 at [facility], accessed from [facility], even though the hospital's physical address listed is [address].    If scheduled at [facility] or [facility], please arrive 15 mins early for check-in and test prep.   Please follow these instructions carefully (unless otherwise directed):  Hold all erectile dysfunction medications at least 3 days (72 hrs) prior to test. (Ie viagra, cialis, sildenafil, tadalafil, etc) We will administer nitroglycerin during this exam.   On the Night Before the  Test: Be sure to Drink plenty of water. Do not consume any caffeinated/decaffeinated beverages or chocolate 12 hours prior to your test. Do not take any antihistamines 12 hours prior to your test.  On the Day of the Test: Drink plenty of water until 1 hour prior to the test. Do not eat any food 1 hour prior to test. You may take your regular medications prior to the test.  Take metoprolol (Lopressor) two hours prior to test. If you take Furosemide/Hydrochlorothiazide/Spironolactone, please HOLD on the morning of the test.      After the Test: Drink plenty of water. After receiving IV contrast, you may experience a mild flushed feeling. This is normal. On occasion, you may experience a mild rash up to 24 hours after the test. This is not dangerous. If this occurs, you can take Benadryl 25 mg and increase your fluid intake. If you experience trouble breathing, this can be serious. If it is severe call 911 IMMEDIATELY. If it is mild, please call our office. If you take any of these medications: Glipizide/Metformin, Avandament, Glucavance, please do not take 48 hours after completing test unless otherwise instructed.  We will call to schedule your test 2-4 weeks out understanding that some insurance companies will need an authorization prior to the service being performed.   For non-scheduling related questions, please contact the cardiac imaging nurse navigator should you have any questions/concerns: Marchia Bond, Cardiac Imaging Nurse Navigator Gordy Clement, Cardiac Imaging Nurse Navigator  [facility] Direct Office Dial: [phone]   For scheduling needs, including cancellations and rescheduling, please call [place], [phone].   Signed, Buford Dresser, MD PhD 10/09/2022 4:07 PM    [facility]

## 2022-10-09 ENCOUNTER — Ambulatory Visit: Payer: Self-pay | Admitting: *Deleted

## 2022-10-09 MED ORDER — MECLIZINE HCL 12.5 MG PO TABS: 12.5000 mg | ORAL_TABLET | Freq: Three times a day (TID) | ORAL | 0 refills | Status: DC | PRN

## 2022-10-09 NOTE — Telephone Encounter (Signed)
Refilled

## 2022-10-09 NOTE — Telephone Encounter (Signed)
Message from [place] sent at 10/09/2022  3:22 PM EST  Summary: experiencing dizzy spells and nausea / Requesting refills on Rx   Pt is calling to follow up on her medication refill for meclizine (ANTIVERT) 12.5 MG tablet (medication is pending). She stated she is still experiencing dizzy spells and nausea she needs her medication. Pt stated she was diagnosed with vertigo at the ED on 02/22.  Mentioned being scheduled to see cardiologist.   Seeking clinical advice          Call History   Type Contact Phone/Fax User  10/09/2022 03:16 PM EST Phone (Incoming) Scott- Carrie Mew, Jonni Ibach (Self) [phone] Lemmie Evens) McGill, Alondra   Reason for Disposition  Caller requesting a CONTROLLED substance prescription refill (e.g., narcotics, ADHD medicines)  Answer Assessment - Initial Assessment Questions 1. DRUG NAME: "What medicine do you need to have refilled?"     Meclizine (Antivert) 12.5 mg tablets 2. REFILLS REMAINING: "How many refills are remaining?" (Note: The label on the medicine or pill bottle will show how many refills are remaining. If there are no refills remaining, then a renewal may be needed.)     Provider in the ED prescribed this for her.   Told her her refill would need to come from her PCP.   Dr. Charlott Rakes.  She would have to approve it since it is a controlled substance.   3. EXPIRATION DATE: "What is the expiration date?" (Note: The label states when the prescription will expire, and thus can no longer be refilled.)     Not asked 4. PRESCRIBING HCP: "Who prescribed it?" Reason: If prescribed by specialist, call should be referred to that group.     Dr. [name], her PCP would need to refill this.    It was prescribed by the provider in the ED for dizziness. 5. SYMPTOMS: "Do you have any symptoms?"     Yes.  Having dizzy spells and nausea.   She was diagnosed with vertigo in the ED on 09/26/2022 6. PREGNANCY: "Is there any chance that you are pregnant?" "When was your  last menstrual period?"     Not asked due to age Medication request has been sent to Dr. [name] but still waiting on her approval.   Pt has an appt coming up on 10/17/2022 with Dr. [name].  Protocols used: Medication Refill and Renewal Call-A-AH

## 2022-10-09 NOTE — Telephone Encounter (Signed)
Requested medication (s) are due for refill today: review  Requested medication (s) are on the active medication list: yes  Last refill:  09/26/22 #15/0  Future visit scheduled: yes  Notes to clinic:  not delegated and prescribed by another provider     Requested Prescriptions  Pending Prescriptions Disp Refills   meclizine (ANTIVERT) 12.5 MG tablet [Pharmacy Med Name: MECLIZINE 12.'5MG'$  (RX) TABLETS] 15 tablet 0    Sig: TAKE 1 TABLET(12.5 MG) BY MOUTH THREE TIMES DAILY AS NEEDED FOR DIZZINESS     Not Delegated - Gastroenterology: Antiemetics Failed - 10/08/2022  5:08 PM      Failed - This refill cannot be delegated      Passed - Valid encounter within last 6 months    Recent Outpatient Visits           2 months ago Overactive bladder   [place], [name], MD   7 months ago Hypercholesterolemia   [place], [name], MD   8 months ago Uterine leiomyoma, unspecified location   [place], [name], MD   10 months ago Acute abdominal pain   [place], MD   1 year ago Atypical chest pain   [place], [name], MD       Future Appointments             In 1 week Charlott Rakes, MD [place]   In 1 month Charlott Rakes, MD [place]   In 1 month Buford Dresser, MD [facility] at [facility] [place]

## 2022-10-09 NOTE — Telephone Encounter (Signed)
  Chief Complaint: Needing a refill of the Antivert 12.5 mg tablets that was prescribed for her by the provider in the ED for vertigo on 09/26/2022.     Pt. Has an upcoming appt. With Dr. Charlott Rakes for 10/17/2022 but is needing this before then. Symptoms: Dizziness and nausea from vertigo Frequency: Daily  Having to be careful ambulating. Pertinent Negatives: Patient denies falling but is having to be very careful due to the dizziness. Disposition: '[]'$ ED /'[]'$ Urgent Care (no appt availability in office) / '[]'$ Appointment(In office/virtual)/ '[]'$  [facility]/ '[]'$ Home Care/ '[]'$ Refused Recommended Disposition /'[]'$ [place] [facility]/ '[x]'$  Follow-up with PCP Additional Notes: Message sent to Dr. [name] at [place] for a refill of the medication until pt. Can be seen on 10/17/2022.   The ED provider only gave her a few days worth.

## 2022-10-09 NOTE — Addendum Note (Signed)
Addended by: Charlott Rakes on: 10/09/2022 05:17 PM   Modules accepted: Orders

## 2022-10-09 NOTE — Telephone Encounter (Signed)
Pt is requesting refill on Meclizine.

## 2022-10-10 NOTE — Telephone Encounter (Signed)
NO VM set up to leave a message, medication has been refilled.

## 2022-10-11 ENCOUNTER — Other Ambulatory Visit: Payer: Commercial Managed Care - HMO

## 2022-10-17 ENCOUNTER — Ambulatory Visit: Payer: Self-pay

## 2022-10-17 ENCOUNTER — Ambulatory Visit: Payer: Commercial Managed Care - HMO | Attending: Family Medicine | Admitting: Family Medicine

## 2022-10-17 ENCOUNTER — Telehealth: Payer: Self-pay | Admitting: Emergency Medicine

## 2022-10-17 DIAGNOSIS — F430 Acute stress reaction: Secondary | ICD-10-CM

## 2022-10-17 DIAGNOSIS — R42 Dizziness and giddiness: Secondary | ICD-10-CM

## 2022-10-17 DIAGNOSIS — E038 Other specified hypothyroidism: Secondary | ICD-10-CM

## 2022-10-17 DIAGNOSIS — F32A Depression, unspecified: Secondary | ICD-10-CM

## 2022-10-17 DIAGNOSIS — F419 Anxiety disorder, unspecified: Secondary | ICD-10-CM

## 2022-10-17 DIAGNOSIS — Z1231 Encounter for screening mammogram for malignant neoplasm of breast: Secondary | ICD-10-CM

## 2022-10-17 MED ORDER — LEVOTHYROXINE SODIUM 125 MCG PO TABS: 125.0000 ug | ORAL_TABLET | Freq: Every day | ORAL | 1 refills | Status: DC

## 2022-10-17 MED ORDER — HYDROXYZINE HCL 25 MG PO TABS: 25.0000 mg | ORAL_TABLET | Freq: Two times a day (BID) | ORAL | 6 refills | Status: DC | PRN

## 2022-10-17 NOTE — Addendum Note (Signed)
Addended by: Charlott Rakes on: 10/17/2022 03:43 PM   Modules accepted: Orders

## 2022-10-17 NOTE — Telephone Encounter (Signed)
Done

## 2022-10-17 NOTE — Progress Notes (Signed)
Virtual Visit via Telephone Note  I connected with Heidi Barrett, on 10/17/2022 at 9:31 AM by telephone and verified that I am speaking with the correct person using two identifiers.   Consent: I discussed the limitations, risks, security and privacy concerns of performing an evaluation and management service by telephone and the availability of in person appointments. I also discussed with the patient that there may be a patient responsible charge related to this service. The patient expressed understanding and agreed to proceed.  Visit was scheduled as a video visit however despite my providing instructions and walking her through the process she could not conduct a video visit and this had to be performed as an audio visit.  Location of Patient: Home  Location of Provider: Clinic   Persons participating in Telemedicine visit: DANAJIA GOODNER- Carrie Mew Dr. [name]     History of Present Illness: Heidi Barrett- Carrie Mew is a 57 y.o. year old female with a history of hypertension, menorrhagia secondary to fibroids, hypothyroidism, degenerative disease of the lumbar spine, low back pain with sciatica, chronic right knee pain.  Seen at the ED for vertigo and placed on meclizine.   She still has dizzy spells and blurry vision. This affects her balance and she is unable to walk when this occurs.  Denies presence of headaches. At her Cardiology appointment, last week a coronary CT was ordered. She is requesting refill of her levothyroxine for her hypothyroidism and hydroxyzine which she takes for anxiety and depression. Past Medical History:  Diagnosis Date   Anemia    Hypertension    Thyroid disease    Allergies  Allergen Reactions   Hctz [Hydrochlorothiazide] Shortness Of Breath and Other (See Comments)    wheezing   Robaxin [Methocarbamol] Swelling    Sensation of throat swelling/difficulty swallowing after taking this medication   Tramadol Anaphylaxis and  Swelling   Hydrocodone Nausea And Vomiting    dizzines dizzines   Oxycodone-Acetaminophen Nausea And Vomiting    weakness   Percocet [Oxycodone-Acetaminophen] Nausea And Vomiting and Other (See Comments)    weakness    Current Outpatient Medications on File Prior to Visit  Medication Sig Dispense Refill   albuterol (PROVENTIL HFA) 108 (90 Base) MCG/ACT inhaler Inhale 2 puffs into the lungs every 4 (four) hours as needed for wheezing or shortness of breath. 6.7 g 2   amLODipine (NORVASC) 10 MG tablet TAKE 1 TABLET(10 MG) BY MOUTH DAILY FOR BLOOD PRESSURE 90 tablet 1   atorvastatin (LIPITOR) 20 MG tablet TAKE 1 TABLET(20 MG) BY MOUTH DAILY 90 tablet 1   calcium carbonate (CALCIUM 600) 600 MG TABS tablet Take 1 tablet (600 mg total) by mouth daily with breakfast. 60 tablet 3   cyclobenzaprine (FLEXERIL) 5 MG tablet Take 1 tablet (5 mg total) by mouth 3 (three) times daily as needed. 10 tablet 0   DULoxetine (CYMBALTA) 60 MG capsule Take 1 capsule (60 mg total) by mouth daily. For chronic pain 90 capsule 1   EPINEPHrine 0.3 mg/0.3 mL IJ SOAJ injection Inject 0.3 mg into the muscle as needed for anaphylaxis. 1 each prn   fluticasone (FLONASE) 50 MCG/ACT nasal spray SHAKE LIQUID AND USE 2 SPRAYS IN EACH NOSTRIL DAILY 16 g 1   gabapentin (NEURONTIN) 300 MG capsule Take 2 capsules (600 mg total) by mouth at bedtime. 180 capsule 1   hydrOXYzine (ATARAX) 25 MG tablet Take 1 tablet (25 mg total) by mouth 2 (two) times daily as needed. TAKE 1  TABLET(25 MG) BY MOUTH AT BEDTIME 60 tablet 6   ibuprofen (ADVIL) 800 MG tablet Take 1 tablet (800 mg total) by mouth 3 (three) times daily. 21 tablet 0   levothyroxine (SYNTHROID) 125 MCG tablet Take 1 tablet (125 mcg total) by mouth daily before breakfast. 90 tablet 1   meclizine (ANTIVERT) 12.5 MG tablet Take 1 tablet (12.5 mg total) by mouth 3 (three) times daily as needed for dizziness. 30 tablet 0   metoprolol tartrate (LOPRESSOR) 50 MG tablet Take 1 tablet  (50 mg total) by mouth 2 (two) times daily. 180 tablet 1   Misc. Devices MISC 4 pronged cane. Diagnosis right knee degenerative changes 1 each 0   Misc. Devices MISC Rolling walker with seat. Diagnosis Right knee degenerative changes 1 each 0   Misc. Devices MISC Depends, wipes, bed pads. Diagnosis : urinary incontinence. 1 each 0   Multiple Vitamin (MULTIVITAMIN WITH MINERALS) TABS tablet Take 1 tablet by mouth daily. 90 tablet 3   tiZANidine (ZANAFLEX) 4 MG tablet Take 1 tablet (4 mg total) by mouth 2 (two) times daily. 60 tablet 6   tolterodine (DETROL) 2 MG tablet Take 1 tablet (2 mg total) by mouth 2 (two) times daily. 60 tablet 3   No current facility-administered medications on file prior to visit.    ROS: See HPI  Observations/Objective: Awake, alert, oriented x3 Not in acute distress Normal mood      Latest Ref Rng & Units 09/26/2022    1:38 PM 09/26/2022   12:56 PM 07/18/2022   11:06 AM  CMP  Glucose 70 - 99 mg/dL 102  110  82   BUN 6 - 20 mg/dL '11  10  11   '$ Creatinine 0.44 - 1.00 mg/dL 1.10  0.98  1.10   Sodium 135 - 145 mmol/L 136  133  142   Potassium 3.5 - 5.1 mmol/L 4.2  4.1  4.1   Chloride 98 - 111 mmol/L 101  101  101   CO2 22 - 32 mmol/L  26  27   Calcium 8.9 - 10.3 mg/dL  8.6  9.8   Total Protein 6.5 - 8.1 g/dL  6.2  7.1   Total Bilirubin 0.3 - 1.2 mg/dL  0.7  0.5   Alkaline Phos 38 - 126 U/L  46  74   AST 15 - 41 U/L  19  18   ALT 0 - 44 U/L  30  40     Lipid Panel     Component Value Date/Time   CHOL 150 02/25/2022 1038   TRIG 99 02/25/2022 1038   HDL 57 02/25/2022 1038   CHOLHDL 2.4 10/26/2020 0957   CHOLHDL 2.6 01/16/2015 1041   VLDL 9 01/16/2015 1041   LDLCALC 75 02/25/2022 1038   LABVLDL 18 02/25/2022 1038    Lab Results  Component Value Date   HGBA1C 4.5 05/31/2014    Lab Results  Component Value Date   TSH 1.340 07/18/2022    Assessment and Plan: 1. Vertigo Uncontrolled Currently on meclizine Discussed option for vestibular  rehab but she would like to give meclizine sometime to work before considering vestibular rehab She is also undergoing cardiac workup to evaluate cardiac etiology  2. Acute stress disorder Stable - hydrOXYzine (ATARAX) 25 MG tablet; Take 1 tablet (25 mg total) by mouth 2 (two) times daily as needed. TAKE 1 TABLET(25 MG) BY MOUTH AT BEDTIME  Dispense: 60 tablet; Refill: 6  3. Anxiety and depression Controlled - hydrOXYzine (  ATARAX) 25 MG tablet; Take 1 tablet (25 mg total) by mouth 2 (two) times daily as needed. TAKE 1 TABLET(25 MG) BY MOUTH AT BEDTIME  Dispense: 60 tablet; Refill: 6  4. Other specified hypothyroidism Controlled - levothyroxine (SYNTHROID) 125 MCG tablet; Take 1 tablet (125 mcg total) by mouth daily before breakfast.  Dispense: 90 tablet; Refill: 1   Follow Up Instructions: Keep previously scheduled appt   I discussed the assessment and treatment plan with the patient. The patient was provided an opportunity to ask questions and all were answered. The patient agreed with the plan and demonstrated an understanding of the instructions.   The patient was advised to call back or seek an in-person evaluation if the symptoms worsen or if the condition fails to improve as anticipated.     I provided 15 minutes total of non-face-to-face time during this encounter.   Charlott Rakes, MD, FAAFP. [facility] [place], [place]   10/17/2022, 9:31 AM

## 2022-10-17 NOTE — Telephone Encounter (Signed)
Routing to PCP for clarification of SIG.

## 2022-10-17 NOTE — Telephone Encounter (Signed)
Copied from [place] [id number]. Topic: Referral - Request for Referral >> Oct 17, 2022  9:43 AM Oley Balm A wrote:   Has patient seen PCP for this complaint? No. Pt is requesting a referral for a mammogram. Pt would like to be referred to a location that takes her [facility] insurance. Please advise.

## 2022-10-17 NOTE — Telephone Encounter (Signed)
  Pharmacy is calling to clarify the sig on hydrOXYzine (ATARAX) 25 MG tablet [[id number]]. Please advise     Chief Complaint: Pharmacy needs verification on instructions. Take 1 pill at HS and an additional 2 pills prn? Symptoms: n/a Frequency: n/a Pertinent Negatives: Patient denies  Disposition: [] ED /[] Urgent Care (no appt availability in office) / [] Appointment(In office/virtual)/ []  [facility]/ [] Home Care/ [] Refused Recommended Disposition /[] [place] [facility]/ [x]  Follow-up with PCP Additional Notes: Please advise pharmacy.  Answer Assessment - Initial Assessment Questions 1. NAME of MEDICINE: "What medicine(s) are you calling about?"     Atarax 2. QUESTION: "What is your question?" (e.g., double dose of medicine, side effect)     Is pt. To take 1 pill at HS and an additional 2 pills during the day, or just a total of 2 pills per day. 3. PRESCRIBER: "Who prescribed the medicine?" Reason: if prescribed by specialist, call should be referred to that group.     Dr. [name] 4. SYMPTOMS: "Do you have any symptoms?" If Yes, ask: "What symptoms are you having?"  "How bad are the symptoms (e.g., mild, moderate, severe)     N/a 5. PREGNANCY:  "Is there any chance that you are pregnant?" "When was your last menstrual period?"     No  Protocols used: Medication Question Call-A-AH

## 2022-10-18 NOTE — Addendum Note (Signed)
Addended byCharlott Rakes on: 10/18/2022 09:19 AM   Modules accepted: Orders

## 2022-10-18 NOTE — Telephone Encounter (Signed)
Referral has been placed. 

## 2022-10-22 ENCOUNTER — Telehealth (HOSPITAL_COMMUNITY): Payer: Self-pay | Admitting: *Deleted

## 2022-10-22 NOTE — Telephone Encounter (Signed)
Reaching out to patient to offer assistance regarding upcoming cardiac imaging study; pt verbalizes understanding of appt date/time, parking situation and where to check in, pre-test NPO status and medications ordered, and verified current allergies; name and call back number provided for further questions should they arise  Alleigh Mollica RN Navigator Cardiac Imaging [facility] [phone] office [phone] cell  Patient to take 50mg metoprolol tartrate two hours prior to her cardiac CT scan. She is aware to arrive at 3pm. 

## 2022-10-23 ENCOUNTER — Telehealth (HOSPITAL_BASED_OUTPATIENT_CLINIC_OR_DEPARTMENT_OTHER): Payer: Self-pay | Admitting: Cardiology

## 2022-10-23 ENCOUNTER — Ambulatory Visit (HOSPITAL_COMMUNITY): Admission: RE | Admit: 2022-10-23 | Payer: Commercial Managed Care - HMO | Source: Ambulatory Visit

## 2022-10-23 NOTE — Telephone Encounter (Signed)
New Message:   Patient says she is scheduled for a CT test this afternoon. She wants Dr Harrell Gave to know that she will not able to take that test. She said she is supposed to drink a lot of water before she takes the test. She says she is unable to drink a lot of water, because of a certain condition she has. She wants to know if there is another test that she can take in the place of the CT please?
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
## 2022-10-23 NOTE — Telephone Encounter (Signed)
Scheduled for cardiac CT. She can just drink a normal water of water until the test and after the test. This just helps to clear the contrast used from the kidneys. No need for excessive water intake so anticipate she can proceed with CT scan as scheduled.   Loel Dubonnet, NP

## 2022-10-29 ENCOUNTER — Telehealth (HOSPITAL_COMMUNITY): Payer: Self-pay | Admitting: Emergency Medicine

## 2022-10-29 NOTE — Telephone Encounter (Signed)
Reaching out to patient to offer assistance regarding upcoming cardiac imaging study; pt verbalizes understanding of appt date/time, parking situation and where to check in, pre-test NPO status and medications ordered, and verified current allergies; name and call back number provided for further questions should they arise Marchia Bond RN Navigator Cardiac Imaging [name] [phone] office [phone] cell  Arrival 300 WC entrance Denies Iv issues 50mg  metoprolol tartrate 2 hr prior Aware contrast/nitro

## 2022-10-30 ENCOUNTER — Ambulatory Visit (HOSPITAL_COMMUNITY)
Admission: RE | Admit: 2022-10-30 | Discharge: 2022-10-30 | Disposition: A | Discharge status: Home / Self Care | Payer: Medicaid Other | Source: Ambulatory Visit | Attending: Cardiology | Admitting: Cardiology

## 2022-10-30 DIAGNOSIS — I10 Essential (primary) hypertension: Secondary | ICD-10-CM | POA: Insufficient documentation

## 2022-10-30 DIAGNOSIS — R072 Precordial pain: Secondary | ICD-10-CM | POA: Diagnosis not present

## 2022-10-30 DIAGNOSIS — R079 Chest pain, unspecified: Secondary | ICD-10-CM | POA: Diagnosis not present

## 2022-10-30 MED ORDER — NITROGLYCERIN 0.4 MG SL SUBL
SUBLINGUAL_TABLET | SUBLINGUAL | Status: AC
  Filled 2022-10-30: qty 2

## 2022-10-30 MED ORDER — IOHEXOL 350 MG/ML SOLN
100.0000 mL | Freq: Once | INTRAVENOUS | Status: AC | PRN
  Administered 2022-10-30: 100 mL via INTRAVENOUS

## 2022-10-30 MED ORDER — NITROGLYCERIN 0.4 MG SL SUBL
SUBLINGUAL_TABLET | SUBLINGUAL | Status: AC
  Administered 2022-10-30: 0.8 mg via SUBLINGUAL
  Filled 2022-10-30: qty 2

## 2022-10-30 MED ORDER — METOPROLOL TARTRATE 5 MG/5ML IV SOLN
INTRAVENOUS | Status: AC
  Filled 2022-10-30: qty 5

## 2022-10-30 MED ORDER — NITROGLYCERIN 0.4 MG SL SUBL: 0.8000 mg | SUBLINGUAL_TABLET | Freq: Once | SUBLINGUAL | Status: AC

## 2022-11-05 ENCOUNTER — Ambulatory Visit
Admission: RE | Admit: 2022-11-05 | Discharge: 2022-11-05 | Disposition: A | Discharge status: Home / Self Care | Payer: Commercial Managed Care - HMO | Source: Ambulatory Visit | Attending: Surgical | Admitting: Surgical

## 2022-11-05 DIAGNOSIS — M25551 Pain in right hip: Secondary | ICD-10-CM

## 2022-11-18 ENCOUNTER — Telehealth: Payer: Self-pay | Admitting: Family Medicine

## 2022-11-18 ENCOUNTER — Ambulatory Visit: Payer: Commercial Managed Care - HMO | Admitting: Family Medicine

## 2022-11-18 ENCOUNTER — Other Ambulatory Visit: Payer: Self-pay | Admitting: *Deleted

## 2022-11-18 DIAGNOSIS — R42 Dizziness and giddiness: Secondary | ICD-10-CM

## 2022-11-18 NOTE — Telephone Encounter (Signed)
OV and DME shower chair order was printed.   Will need to fax to medical supply store.
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
## 2022-11-19 ENCOUNTER — Ambulatory Visit (HOSPITAL_BASED_OUTPATIENT_CLINIC_OR_DEPARTMENT_OTHER): Payer: Commercial Managed Care - HMO | Admitting: Cardiology
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
## 2022-12-05 ENCOUNTER — Ambulatory Visit: Payer: Commercial Managed Care - HMO | Admitting: Family Medicine

## 2022-12-05 ENCOUNTER — Other Ambulatory Visit: Payer: Self-pay | Admitting: Family Medicine

## 2022-12-05 DIAGNOSIS — G8929 Other chronic pain: Secondary | ICD-10-CM

## 2022-12-05 MED ORDER — MECLIZINE HCL 12.5 MG PO TABS: 12.5000 mg | ORAL_TABLET | Freq: Three times a day (TID) | ORAL | 0 refills | Status: DC | PRN

## 2022-12-05 NOTE — Telephone Encounter (Signed)
Unable to refill per protocol, Rx request for Cymbalta is too soon. Last refill 07/05/22 for 90 and 1 refill.  Requested Prescriptions  Pending Prescriptions Disp Refills   meclizine (ANTIVERT) 12.5 MG tablet 30 tablet 0    Sig: Take 1 tablet (12.5 mg total) by mouth 3 (three) times daily as needed for dizziness.     Not Delegated - Gastroenterology: Antiemetics Failed - 12/05/2022 11:55 AM      Failed - This refill cannot be delegated      Passed - Valid encounter within last 6 months    Recent Outpatient Visits           1 month ago Vertigo   [facility] Hoy Register, MD   4 months ago Overactive bladder   [place] [facility] Hoy Register, MD   9 months ago Hypercholesterolemia   [facility] Hoy Register, MD   10 months ago Uterine leiomyoma, unspecified location   [facility] Hoy Register, MD   1 year ago Acute abdominal pain   [facility] Marcine Matar, MD       Future Appointments             In 1 week Hoy Register, MD [facility]   In 4 weeks Jodelle Red, MD [facility] Vascular at [facility], [place]   In 2 months Hoy Register, MD [facility]   In 2 months Hoy Register, MD  [facility]             DULoxetine (CYMBALTA) 60 MG capsule 90 capsule 1    Sig: Take 1 capsule (60 mg total) by mouth daily. For chronic pain     Psychiatry: Antidepressants - SNRI - duloxetine Failed - 12/05/2022 11:55 AM      Failed - Cr in normal range and within 360 days    Creat  Date Value Ref Range Status  01/16/2015 0.82 0.50 - 1.10 mg/dL Final   Creatinine, Ser  Date Value Ref Range Status  09/26/2022 1.10 (H) 0.44 - 1.00 mg/dL Final         Passed - eGFR is 30 or  above and within 360 days    GFR, Est African American  Date Value Ref Range Status  01/16/2015 >89 mL/min Final   GFR calc Af Amer  Date Value Ref Range Status  01/14/2020 74 >59 mL/min/1.73 Final    Comment:    **[facility] currently reports eGFR in compliance with the current**   recommendations of the [employer]. [facility] will   update reporting as new guidelines are published from the NKF-ASN   Task force.    GFR, Est Non African American  Date Value Ref Range Status  01/16/2015 85 mL/min Final    Comment:      The estimated GFR is a calculation valid for adults (>=12 years old) that uses the CKD-EPI algorithm to adjust for age and sex. It is   not to be used for children, pregnant women, hospitalized patients,    patients on dialysis, or with rapidly changing kidney function. According to the NKDEP, eGFR >89 is normal, 60-89 shows mild impairment, 30-59 shows moderate impairment, 15-29 shows severe impairment and <15 is ESRD.      GFR, Estimated  Date Value  Ref Range Status  09/26/2022 >60 >60 mL/min Final    Comment:    (NOTE) Calculated using the CKD-EPI Creatinine Equation (2021)    eGFR  Date Value Ref Range Status  07/18/2022 60 >59 mL/min/1.73 Final         Passed - Completed PHQ-2 or PHQ-9 in the last 360 days      Passed - Last BP in normal range    BP Readings from Last 1 Encounters:  10/30/22 131/71         Passed - Valid encounter within last 6 months    Recent Outpatient Visits           1 month ago Vertigo   [facility] [place], Odette Horns, MD   4 months ago Overactive bladder   [place] [facility] Hoy [facility], MD   9 months ago Hypercholesterolemia   [facility] Hoy Register, MD   10 months ago Uterine leiomyoma, unspecified location   [facility] Hoy Register, MD   1 year ago Acute  abdominal pain   [place] [facility] Marcine Matar, MD       Future Appointments             In 1 week Hoy Register, MD [facility]   In 4 weeks Jodelle Red, MD [facility] at [facility], [place]   In 2 months Hoy Register, MD [facility]   In 2 months Hoy Register, MD [facility]

## 2022-12-05 NOTE — Telephone Encounter (Signed)
Requested medication (s) are due for refill today: yes  Requested medication (s) are on the active medication list: yes  Last refill:  10/09/22  Future visit scheduled: yes  Notes to clinic:  Unable to refill per protocol, cannot delegate.      Requested Prescriptions  Pending Prescriptions Disp Refills   meclizine (ANTIVERT) 12.5 MG tablet 30 tablet 0    Sig: Take 1 tablet (12.5 mg total) by mouth 3 (three) times daily as needed for dizziness.     Not Delegated - Gastroenterology: Antiemetics Failed - 12/05/2022 11:55 AM      Failed - This refill cannot be delegated      Passed - Valid encounter within last 6 months    Recent Outpatient Visits           1 month ago Vertigo   [facility] [place], Odette Horns, MD   4 months ago Overactive bladder   [place] [facility] Hoy Register, MD   9 months ago Hypercholesterolemia   [facility] Hoy Register, MD   10 months ago Uterine leiomyoma, unspecified location   [facility] Hoy Register, MD   1 year ago Acute abdominal pain   [place] [facility] Marcine Matar, MD       Future Appointments             In 1 week Hoy Register, MD [facility]   In 4 weeks Jodelle Red, MD [facility] at [facility], [place]   In 2 months Hoy Register, MD [facility]   In 2 months Hoy Register, MD [place] [facility]            Refused Prescriptions Disp Refills   DULoxetine (CYMBALTA) 60 MG capsule 90 capsule 1    Sig: Take 1 capsule (60 mg total) by mouth daily. For chronic pain     Psychiatry: Antidepressants - SNRI - duloxetine Failed - 12/05/2022 11:55 AM      Failed - Cr in normal range and within 360 days    Creat  Date Value Ref Range  Status  01/16/2015 0.82 0.50 - 1.10 mg/dL Final   Creatinine, Ser  Date Value Ref Range Status  09/26/2022 1.10 (H) 0.44 - 1.00 mg/dL Final         Passed - eGFR is 30 or above and within 360 days    GFR, Est African American  Date Value Ref Range Status  01/16/2015 >89 mL/min Final   GFR calc Af Amer  Date Value Ref Range Status  01/14/2020 74 >59 mL/min/1.73 Final    Comment:    **[facility] currently reports eGFR in compliance with the current**   recommendations of the [employer]. [facility] will   update reporting as new guidelines are published from the NKF-ASN   Task force.    GFR, Est Non African American  Date Value Ref Range Status  01/16/2015 85 mL/min Final    Comment:      The estimated GFR is a calculation valid for adults (>=70 years old) that uses the CKD-EPI algorithm to adjust for age and sex. It is   not to be used for children, pregnant women, hospitalized patients,    patients on dialysis, or with rapidly changing kidney function. According to the  NKDEP, eGFR >89 is normal, 60-89 shows mild impairment, 30-59 shows moderate impairment, 15-29 shows severe impairment and <15 is ESRD.      GFR, Estimated  Date Value Ref Range Status  09/26/2022 >60 >60 mL/min Final    Comment:    (NOTE) Calculated using the CKD-EPI Creatinine Equation (2021)    eGFR  Date Value Ref Range Status  07/18/2022 60 >59 mL/min/1.73 Final         Passed - Completed PHQ-2 or PHQ-9 in the last 360 days      Passed - Last BP in normal range    BP Readings from Last 1 Encounters:  10/30/22 131/71         Passed - Valid encounter within last 6 months    Recent Outpatient Visits           1 month ago Vertigo   [place] [place], Odette Horns, MD   4 months ago Overactive bladder   [place] [facility] Hoy Register, MD   9 months ago Hypercholesterolemia   [facility] Hoy Register, MD   10 months ago Uterine leiomyoma, unspecified location   [facility] & [facility] Hoy Register, MD   1 year ago Acute abdominal pain   [place] [facility] Marcine Matar, MD       Future Appointments             In 1 week Hoy Register, MD [facility]   In 4 weeks Jodelle Red, MD [facility] Heart [facility] Vascular at [facility], [place]   In 2 months Hoy Register, MD [facility]   In 2 months Hoy Register, MD [facility] & [facility]

## 2022-12-05 NOTE — Telephone Encounter (Signed)
Medication Refill - Medication: meclizine (ANTIVERT) 12.5 MG tablet , DULoxetine (CYMBALTA) 60 MG capsule ,  Has the patient contacted their pharmacy? Yes.   Pharmacy calling stated a fax was sent to the office 04/30.  (Agent: If yes, when and what did the pharmacy advise?)  Preferred Pharmacy (with phone number or street name):  [facility] [address], [place] - [address]  [address] [place] [address]  Phone: [phone] Fax: [phone]  Hours: Not open 24 hours   Has the patient been seen for an appointment in the last year OR does the patient have an upcoming appointment? Yes.    Agent: Please be advised that RX refills may take up to 3 business days. We ask that you follow-up with your pharmacy.

## 2022-12-06 ENCOUNTER — Other Ambulatory Visit: Payer: Self-pay | Admitting: Family Medicine

## 2022-12-06 NOTE — Progress Notes (Signed)
Need appt with me or Dr August Saucer if still having pain

## 2022-12-09 ENCOUNTER — Other Ambulatory Visit: Payer: Self-pay | Admitting: Family Medicine

## 2022-12-09 ENCOUNTER — Telehealth: Payer: Self-pay

## 2022-12-09 DIAGNOSIS — G8929 Other chronic pain: Secondary | ICD-10-CM

## 2022-12-09 NOTE — Telephone Encounter (Signed)
-----   Message from Julieanne Cotton, PA-C sent at 12/06/2022  5:06 PM EDT ----- Need appt with me or Dr August Saucer if still having pain

## 2022-12-09 NOTE — Telephone Encounter (Signed)
Can you please call this one for me? 

## 2022-12-09 NOTE — Telephone Encounter (Addendum)
Medication Refill - Medication: DULoxetine HCl 60 mg Denied said requested too soon on Apr 30  Has the patient contacted their pharmacy? yes (Agent: If no, request that the patient contact the pharmacy for the refill. If patient does not wish to contact the pharmacy document the reason why and proceed with request.) (Agent: If yes, when and what did the pharmacy advise?)pharmacy called directly in  Preferred Pharmacy (with phone number or street name): [facility] [address] [place] - [address]  Phone: [phone] Fax: [phone] Has the patient been seen for an appointment in the last year OR does the patient have an upcoming appointment? yes  Agent: Please be advised that RX refills may take up to 3 business days. We ask that you follow-up with your pharmacy.

## 2022-12-10 MED ORDER — DULOXETINE HCL 60 MG PO CPEP
60.0000 mg | ORAL_CAPSULE | Freq: Every day | ORAL | 0 refills | Status: DC
Start: 2022-12-10 — End: 2023-02-07

## 2022-12-10 NOTE — Telephone Encounter (Signed)
Requested Prescriptions  Pending Prescriptions Disp Refills   DULoxetine (CYMBALTA) 60 MG capsule 90 capsule 0    Sig: Take 1 capsule (60 mg total) by mouth daily. For chronic pain     Psychiatry: Antidepressants - SNRI - duloxetine Failed - 12/09/2022  4:19 PM      Failed - Cr in normal range and within 360 days    Creat  Date Value Ref Range Status  01/16/2015 0.82 0.50 - 1.10 mg/dL Final   Creatinine, Ser  Date Value Ref Range Status  09/26/2022 1.10 (H) 0.44 - 1.00 mg/dL Final         Passed - eGFR is 30 or above and within 360 days    GFR, Est African American  Date Value Ref Range Status  01/16/2015 >89 mL/min Final   GFR calc Af Amer  Date Value Ref Range Status  01/14/2020 74 >59 mL/min/1.73 Final    Comment:    **[facility] currently reports eGFR in compliance with the current**   recommendations of the [employer]. [facility] will   update reporting as new guidelines are published from the NKF-ASN   Task force.    GFR, Est Non African American  Date Value Ref Range Status  01/16/2015 85 mL/min Final    Comment:      The estimated GFR is a calculation valid for adults (>=34 years old) that uses the CKD-EPI algorithm to adjust for age and sex. It is   not to be used for children, pregnant women, hospitalized patients,    patients on dialysis, or with rapidly changing kidney function. According to the NKDEP, eGFR >89 is normal, 60-89 shows mild impairment, 30-59 shows moderate impairment, 15-29 shows severe impairment and <15 is ESRD.      GFR, Estimated  Date Value Ref Range Status  09/26/2022 >60 >60 mL/min Final    Comment:    (NOTE) Calculated using the CKD-EPI Creatinine Equation (2021)    eGFR  Date Value Ref Range Status  07/18/2022 60 >59 mL/min/1.73 Final         Passed - Completed PHQ-2 or PHQ-9 in the last 360 days      Passed - Last BP in normal range    BP Readings from Last 1 Encounters:  10/30/22 131/71         Passed -  Valid encounter within last 6 months    Recent Outpatient Visits           1 month ago Vertigo   [place] [facility] [place], Odette Horns, MD   4 months ago Overactive bladder   [place] [facility] Hoy Register, MD   9 months ago Hypercholesterolemia   [facility] Hoy Register, MD   10 months ago Uterine leiomyoma, unspecified location   [facility] Hoy Register, MD   1 year ago Acute abdominal pain   [place] [facility] Marcine Matar, MD       Future Appointments             In 1 week August Saucer, Corrie Mckusick, MD [facility] [place]   In 1 week Hoy Register, MD [facility]   In 3 weeks Jodelle Red, MD [facility] Heart & Vascular at [place], [place]   In 1 month Hoy Register, MD [facility]  In 2 months Hoy Register, MD [facility]

## 2022-12-12 ENCOUNTER — Other Ambulatory Visit: Payer: Self-pay | Admitting: Family Medicine

## 2022-12-12 DIAGNOSIS — N3281 Overactive bladder: Secondary | ICD-10-CM

## 2022-12-12 MED ORDER — TOLTERODINE TARTRATE 2 MG PO TABS
2.0000 mg | ORAL_TABLET | Freq: Two times a day (BID) | ORAL | 3 refills | Status: DC
Start: 2022-12-12 — End: 2022-12-20

## 2022-12-12 NOTE — Telephone Encounter (Signed)
Medication Refill - Medication:  tolterodine (DETROL) 2 MG table   Has the patient contacted their pharmacy? Yes.   This is the pharmacy calling  Preferred Pharmacy (with phone number or street name): [facility] [address] [place] - [address]  Has the patient been seen for an appointment in the last year OR does the patient have an upcoming appointment? Yes.   BUT it has been a while since pt has been seen by the dr  Agent: Please be advised that RX refills may take up to 3 business days. We ask that you follow-up with your pharmacy.

## 2022-12-12 NOTE — Telephone Encounter (Signed)
Requested Prescriptions  Pending Prescriptions Disp Refills   tolterodine (DETROL) 2 MG tablet 60 tablet 3    Sig: Take 1 tablet (2 mg total) by mouth 2 (two) times daily.     Urology:  Bladder Agents 2 Failed - 12/12/2022  3:42 PM      Failed - Cr in normal range and within 360 days    Creat  Date Value Ref Range Status  01/16/2015 0.82 0.50 - 1.10 mg/dL Final   Creatinine, Ser  Date Value Ref Range Status  09/26/2022 1.10 (H) 0.44 - 1.00 mg/dL Final         Passed - ALT in normal range and within 360 days    ALT  Date Value Ref Range Status  09/26/2022 30 0 - 44 U/L Final         Passed - AST in normal range and within 360 days    AST  Date Value Ref Range Status  09/26/2022 19 15 - 41 U/L Final         Passed - Valid encounter within last 12 months    Recent Outpatient Visits           1 month ago Vertigo   [place] [facility] [place], Odette Horns, MD   4 months ago Overactive bladder   [place] [facility] Hoy Register, MD   9 months ago Hypercholesterolemia   [facility] Hoy Register, MD   11 months ago Uterine leiomyoma, unspecified location   [facility] Hoy Register, MD   1 year ago Acute abdominal pain    [facility] Marcine Matar, MD       Future Appointments             In 6 days August Saucer, Corrie Mckusick, MD [facility] [place]   In 6 days Hoy Register, MD [facility]   In 3 weeks Jodelle Red, MD [facility] Heart & Vascular at [place], [place]   In 1 month Hoy Register, MD [facility]   In 2 months Hoy Register, MD [facility]

## 2022-12-16 ENCOUNTER — Telehealth: Payer: Self-pay

## 2022-12-16 NOTE — Telephone Encounter (Signed)
Medications questions has been answered.

## 2022-12-16 NOTE — Telephone Encounter (Signed)
Copied from CRM [id number]. Topic: General - Other >> Dec 16, 2022 10:21 AM Franchot Heidelberg wrote: Reason for CRM: Westly Pam from [facility] called requesting a call back to clarify medication instructions   Best contact: [phone]

## 2022-12-17 ENCOUNTER — Other Ambulatory Visit: Payer: Self-pay

## 2022-12-18 ENCOUNTER — Other Ambulatory Visit: Payer: Self-pay | Admitting: Family Medicine

## 2022-12-18 ENCOUNTER — Ambulatory Visit: Payer: Commercial Managed Care - HMO | Admitting: Family Medicine

## 2022-12-18 ENCOUNTER — Telehealth: Payer: Self-pay

## 2022-12-18 ENCOUNTER — Ambulatory Visit: Payer: Medicaid Other | Admitting: Orthopedic Surgery

## 2022-12-18 DIAGNOSIS — I10 Essential (primary) hypertension: Secondary | ICD-10-CM

## 2022-12-18 NOTE — Telephone Encounter (Signed)
Copied from CRM [id number]. Topic: General - Other >> Dec 18, 2022  3:46 PM Franchot Heidelberg wrote: Reason for CRM: [facility] called to report that the tolterodine (DETROL) 2 MG tablet is not covered for twice a day by the insurance   Best contact: [phone]

## 2022-12-20 ENCOUNTER — Ambulatory Visit: Payer: Self-pay | Admitting: *Deleted

## 2022-12-20 MED ORDER — SOLIFENACIN SUCCINATE 5 MG PO TABS
5.0000 mg | ORAL_TABLET | Freq: Every day | ORAL | 1 refills | Status: AC
Start: 2022-12-20 — End: ?

## 2022-12-20 NOTE — Telephone Encounter (Signed)
Message from Allen Kell sent at 12/20/2022 10:15 AM EDT  Summary: medication clarification   Christa from the pharmacy states that she is needing clarification on a prescription that was sent to them: tolterodine (DETROL) 2 MG tablet. Per Christa on the script they were given it states to take one tablet by mouth twice a day, but pt is telling them it should be one tablet by mouth once a day. Please call the pharmacy back.   [facility] [place], [place] - [address] Phone: [phone] Fax: [phone]          Call History   Type Contact Phone/Fax User  12/20/2022 10:13 AM EDT Phone (Incoming) Christa [phone] Allred, Jasmine Awe   Reason for Disposition  [1] Pharmacy calling with prescription question AND [2] triager unable to answer question  Answer Assessment - Initial Assessment Questions 1. NAME of MEDICINE: "What medicine(s) are you calling about?"     Tolterodine (Detrol) 2 mg tablet 2. QUESTION: "What is your question?" (e.g., double dose of medicine, side effect)  Christa with [facility] needing clarification on dosing 3. PRESCRIBER: "Who prescribed the medicine?" Reason: if prescribed by specialist, call should be referred to that group.     [facility] provider 4. SYMPTOMS: "Do you have any symptoms?" If Yes, ask: "What symptoms are you having?"  "How bad are the symptoms (e.g., mild, moderate, severe)     N/A 5. PREGNANCY:  "Is there any chance that you are pregnant?" "When was your last menstrual period?"     N/A  Protocols used: Medication Question Call-A-AH

## 2022-12-20 NOTE — Addendum Note (Signed)
Addended by: Hoy Register on: 12/20/2022 12:50 PM   Modules accepted: Orders

## 2022-12-20 NOTE — Telephone Encounter (Signed)
Prescription has been switched to [employer]

## 2022-12-20 NOTE — Telephone Encounter (Signed)
Pt has been informed.

## 2022-12-20 NOTE — Telephone Encounter (Signed)
Routing to PCP for review.

## 2022-12-20 NOTE — Telephone Encounter (Signed)
  Chief Complaint: Heidi Barrett with [facility] needing dosing clarification regarding the Detrol 2 mg tablet.   See her message.   Rx says one thing but pt saying she is taking it differently. Symptoms: N/A Frequency: N/A Pertinent Negatives: Patient denies N/A Disposition: [] ED /[] Urgent Care (no appt availability in office) / [] Appointment(In office/virtual)/ []  [place] [facility]/ [] Home Care/ [] Refused Recommended Disposition /[] [place] [facility]/ [x]  Follow-up with PCP Additional Notes: Message sent to Dr. Alvis Lemmings at [facility] for clarification.   Phone [phone] Fax [phone]

## 2022-12-25 ENCOUNTER — Telehealth: Payer: Self-pay

## 2022-12-25 NOTE — Telephone Encounter (Signed)
Copied from CRM [id number]. Topic: General - Other >> Dec 25, 2022  8:37 AM Franchot Heidelberg wrote: Reason for CRM: the pt needs a doctor's note for her Social Worker letting her know that the patient is disabled and unable to work. PCP has prescribed her a walker and a cane, the patient still has pain in her right knee and her back.   Best contact: [phone]

## 2022-12-25 NOTE — Telephone Encounter (Signed)
Routing tot PCP for review.

## 2022-12-25 NOTE — Telephone Encounter (Unsigned)
Copied from CRM [id number]. Topic: General - Inquiry >> Dec 25, 2022  8:48 AM De Blanch wrote: Reason for CRM:Pt stated her social worker, Baldwin [place], is requesting to speak with someone from the office needs to confirm that pt is unable to work. She stated that she has advised the social worker that she has no income and is unable to walk.   Baldwin Jamaica  [phone]  Please advise.

## 2022-12-25 NOTE — Telephone Encounter (Signed)
Duplicate message. 

## 2022-12-26 ENCOUNTER — Other Ambulatory Visit: Payer: Self-pay | Admitting: Family Medicine

## 2022-12-26 DIAGNOSIS — I10 Essential (primary) hypertension: Secondary | ICD-10-CM

## 2022-12-26 NOTE — Telephone Encounter (Signed)
Pt states that she will take the letter stating her medical conditions.

## 2022-12-26 NOTE — Telephone Encounter (Signed)
Patient would like to speak with someone further about getting a letter or assistance with applying for disability. Patient states she is not able to work because of issues with her knees and incontinence. Please follow up with patient.

## 2022-12-26 NOTE — Telephone Encounter (Signed)
I am unable to state that she is disabled.  I am able to write a letter stating her medical conditions if she desires.

## 2022-12-27 ENCOUNTER — Other Ambulatory Visit: Payer: Self-pay | Admitting: Family Medicine

## 2022-12-27 NOTE — Telephone Encounter (Signed)
Letter has been sent via my chart. Please direct her to someone to assist with disability. Thanks

## 2022-12-27 NOTE — Telephone Encounter (Signed)
Requested medication (s) are due for refill today - yes  Requested medication (s) are on the active medication list -yes  Future visit scheduled -yes  Last refill: 12/06/22 #30   Notes to clinic: non delegated Rx  Requested Prescriptions  Pending Prescriptions Disp Refills   meclizine (ANTIVERT) 12.5 MG tablet [Pharmacy Med Name: MECLIZINE 12.5MG  TAB*RX 12.5 Tablet] 30 tablet 10    Sig: TAKE 1 TABLET BY MOUTH THREE TIMES DAILY AS NEEDED FOR DIZZINESS     Not Delegated - Gastroenterology: Antiemetics Failed - 12/27/2022  7:17 AM      Failed - This refill cannot be delegated      Passed - Valid encounter within last 6 months    Recent Outpatient Visits           2 months ago Vertigo   [place] [facility] Hoy Register, MD   5 months ago Overactive bladder   [place] [facility] Hoy Register, MD   10 months ago Hypercholesterolemia   [place] [facility] Hoy Register, MD   11 months ago Uterine leiomyoma, unspecified location   [facility] Hoy Register, MD   1 year ago Acute abdominal pain   [facility] Marcine Matar, MD       Future Appointments             In 6 days Jodelle Red, MD [facility] Vascular at [facility]   In 1 week August Saucer, Corrie Mckusick, MD [facility] [place]   In 1 month Hoy Register, MD [facility]   In 1 month Hoy Register, MD [place] [facility]               Requested Prescriptions  Pending Prescriptions Disp Refills   meclizine (ANTIVERT) 12.5 MG tablet [Pharmacy Med Name: MECLIZINE 12.5MG  TAB*RX 12.5 Tablet] 30 tablet 10    Sig: TAKE 1 TABLET BY MOUTH THREE TIMES DAILY AS NEEDED FOR DIZZINESS     Not Delegated - Gastroenterology: Antiemetics Failed - 12/27/2022  7:17 AM       Failed - This refill cannot be delegated      Passed - Valid encounter within last 6 months    Recent Outpatient Visits           2 months ago Vertigo   [facility] Hoy Register, MD   5 months ago Overactive bladder   [facility] [facility] Hoy Register, MD   10 months ago Hypercholesterolemia   [facility] Hoy Register, MD   11 months ago Uterine leiomyoma, unspecified location   [facility] Hoy Register, MD   1 year ago Acute abdominal pain    [facility] Marcine Matar, MD       Future Appointments             In 6 days Jodelle Red, MD [facility]   In 1 week August Saucer, Corrie Mckusick, MD [facility] [place]   In 1 month Hoy Register, MD [facility]   In 1 month Hoy Register, MD [facility]

## 2023-01-02 ENCOUNTER — Telehealth (HOSPITAL_BASED_OUTPATIENT_CLINIC_OR_DEPARTMENT_OTHER): Payer: Self-pay | Admitting: Cardiology

## 2023-01-02 ENCOUNTER — Ambulatory Visit (HOSPITAL_BASED_OUTPATIENT_CLINIC_OR_DEPARTMENT_OTHER): Payer: Commercial Managed Care - HMO | Admitting: Cardiology

## 2023-01-02 NOTE — Telephone Encounter (Signed)
Patient missed appt today with Dr. Cristal Deer. She states that she has mental health issues and forgot about the appointment. She wants to know if she needs to be on any medication per her CT results. She also has been rescheduled to see [name] on Monday 06/03 but wants to know if she should keep that appt.

## 2023-01-02 NOTE — Telephone Encounter (Signed)
Spoke with patient and advised Luther Parody would go over CT results Monday at visit

## 2023-01-06 ENCOUNTER — Ambulatory Visit (HOSPITAL_BASED_OUTPATIENT_CLINIC_OR_DEPARTMENT_OTHER): Payer: Commercial Managed Care - HMO | Admitting: Family

## 2023-01-06 NOTE — Progress Notes (Deleted)
Office Visit    Patient Name: Heidi Barrett Date of Encounter: 01/06/2023  PCP:  Hoy Register, MD   [facility]  Cardiologist:  Jodelle Red, MD  Advanced Practice Provider:  No care team member to display Electrophysiologist:  None      Chief Complaint    Heidi Barrett is a 57 y.o. female presents today for follow-up after cardiac CTA  Past Medical History    Past Medical History:  Diagnosis Date   Anemia    Hypertension    Thyroid disease    Past Surgical History:  Procedure Laterality Date   IR GENERIC HISTORICAL  05/28/2016   IR RADIOLOGIST EVAL & MGMT 05/28/2016 Richarda Overlie, MD [facility] INTERV RAD   IR RADIOLOGIST EVAL & MGMT  03/06/2017   IR RADIOLOGIST EVAL & MGMT  02/17/2018    Allergies  Allergies  Allergen Reactions   Hctz [Hydrochlorothiazide] Shortness Of Breath and Other (See Comments)    wheezing   Robaxin [Methocarbamol] Swelling    Sensation of throat swelling/difficulty swallowing after taking this medication   Tramadol Anaphylaxis and Swelling   Hydrocodone Nausea And Vomiting    dizzines dizzines   Oxycodone-Acetaminophen Nausea And Vomiting    weakness   Percocet [Oxycodone-Acetaminophen] Nausea And Vomiting and Other (See Comments)    weakness    History of Present Illness    Heidi Barrett is a 57 y.o. female with a hx of hypertension last seen 10/08/2022 by Dr. Cristal Deer.  Family history notable for there with CAD s/p CABG x7 and father with enlarged heart, hypertension.  Prior ETT 2017 low risk with no ischemia.  Prior ED visit 09/26/2022 with vertigo nonspecific chest pain with unremarkable EKG and troponins.  She was referred to cardiology and established with Dr. Cristal Deer 10/08/2022 noting intermittent dull chest pain.  Subsequent Coronary CTA 10/30/2022 coronary calcium score of 0 with no evidence of heart disease.  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   Noncardiac read did reveal a 1.9 cm hypodense nodule in deep subcutaneous up tissue of the left lower back potentially representing deep subcutaneous cyst.  She presents today for follow-up. ***  EKGs/Labs/Other Studies Reviewed:   The following studies were reviewed today: Cardiac Studies & Procedures     STRESS TESTS  EXERCISE TOLERANCE TEST (ETT) 07/24/2016  Narrative  Blood pressure demonstrated a hypertensive response to exercise.  There was no ST segment deviation noted during stress.  No T wave inversion was noted during stress.  Overall, the patient's exercise capacity was excellent.  The patient experienced no angina during the stress test.  [facility] Treadmill Score: low risk  Negative stress test without evidence of ischemia at given workload. Hypertensive response to exercise.      CT SCANS  CT CORONARY MORPH W/CTA COR W/SCORE 11/01/2022  Addendum 11/01/2022  4:31 PM ADDENDUM REPORT: 11/01/2022 16:29  EXAM: OVER-READ INTERPRETATION  CT CHEST  The following report is an over-read performed by radiologist Dr. Joelene Millin [facility] on 11/01/2022. This over-read does not include interpretation of cardiac or coronary anatomy or pathology. The coronary CTA interpretation by the cardiologist is attached.  COMPARISON:  CT 11/13/2021  FINDINGS: Imaged lung bases are clear. Lower mediastinum unremarkable. Included portions of upper abdomen are unremarkable. No acute osseous abnormality. 1.9 cm hypodense lesion in the deep subcutaneous soft tissues of the left back/lower thoracic paraspinal region, series 10, image 35.  IMPRESSION: 1.9 cm hypodense nodule  in the deep subcutaneous soft tissues of the left lower back, potentially representing deep subcutaneous cyst, however suggest correlation with targeted sonography.   Electronically Signed By: Jasmine Pang M.D. On: 11/01/2022 16:29  Narrative CLINICAL DATA:  57 yo female with chest  pain  EXAM: Cardiac/Coronary CTA  TECHNIQUE: A non-contrast, gated CT scan was obtained with axial slices of 3 mm through the heart for calcium scoring. Calcium scoring was performed using the Agatston method. A 120 kV prospective, gated, contrast cardiac scan was obtained. Gantry rotation speed was 250 msecs and collimation was 0.6 mm. Two sublingual nitroglycerin tablets (0.8 mg) were given. The 3D data set was reconstructed in 5% intervals of the 35-75% of the R-R cycle. Diastolic phases were analyzed on a dedicated workstation using MPR, MIP, and VRT modes. The patient received 95 cc of contrast.  FINDINGS: Image quality: Average.  Noise artifact is: Moderate. (signal-to-noise).  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  Left main: The left main is a large caliber vessel with a normal take off from the left coronary cusp that bifurcates to form a left anterior descending artery and a left circumflex artery. There is no plaque or stenosis.  Left anterior descending artery: The LAD is patent without evidence of plaque or stenosis. The LAD gives off 1 patent diagonal branch.  Left circumflex artery: The LCX is dominant and patent with no evidence of plaque or stenosis. The LCX gives off large, patent, branching OM and terminates in small PDA  Right coronary artery: The RCA is non-dominant; anomalous origin from LAD; courses anterior to pulmonary artery.  Right Atrium: Right atrial size is within normal limits.  Right Ventricle: The right ventricular cavity is within normal limits.  Left Atrium: Left atrial size is normal in size with no left atrial appendage filling defect.  Left Ventricle: The ventricular cavity size is within normal limits. There are no stigmata of prior infarction. There is no abnormal filling defect.  Pulmonary arteries: Normal in size without proximal filling defect.  Pulmonary veins: Normal pulmonary venous  drainage.  Pericardium: Normal thickness with no significant effusion or calcium present.  Cardiac valves: The aortic valve is trileaflet without significant calcification. The mitral valve is normal structure without significant calcification.  Aorta: Normal caliber with no significant disease.  Extra-cardiac findings: See attached radiology report for non-cardiac structures.  IMPRESSION: 1. Coronary calcium score of 0. This was 0 percentile for age-, sex, and race-matched controls.  2. Left dominance.  3. No evidence of CAD.  4. 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  RECOMMENDATIONS: CAD-RADS 0: No evidence of CAD (0%). Consider non-atherosclerotic causes of chest pain.  Olga Millers, MD  Electronically Signed: By: Olga Millers M.D. On: 10/30/2022 16:22           EKG:  EKG is *** ordered today.  The ekg ordered today demonstrates ***  Recent Labs: 07/18/2022: TSH 1.340 09/26/2022: ALT 30; BUN 11; Creatinine, Ser 1.10; Hemoglobin 12.2; Platelets 134; Potassium 4.2; Sodium 136  Recent Lipid Panel    Component Value Date/Time   CHOL 150 02/25/2022 1038   TRIG 99 02/25/2022 1038   HDL 57 02/25/2022 1038   CHOLHDL 2.4 10/26/2020 0957   CHOLHDL 2.6 01/16/2015 1041   VLDL 9 01/16/2015 1041   LDLCALC 75 02/25/2022 1038    Home Medications   No outpatient medications have been marked as taking for the 01/06/23 encounter (Appointment) with Alver Sorrow, NP.  Review of Systems      All other systems reviewed and are otherwise negative except as noted above.  Physical Exam    VS:  There were no vitals taken for this visit. , BMI There is no height or weight on file to calculate BMI.  Wt Readings from Last 3 Encounters:  10/08/22 231 lb 1.6 oz (104.8 kg)  09/26/22 213 lb (96.6 kg)  07/18/22 223 lb 3.2 oz (101.2 kg)     GEN: Well nourished, well developed, in no acute distress. HEENT: normal. Neck: Supple, no  JVD, carotid bruits, or masses. Cardiac: ***RRR, no murmurs, rubs, or gallops. No clubbing, cyanosis, edema.  ***Radials/PT 2+ and equal bilaterally.  Respiratory:  ***Respirations regular and unlabored, clear to auscultation bilaterally. GI: Soft, nontender, nondistended. MS: No deformity or atrophy. Skin: Warm and dry, no rash. Neuro:  Strength and sensation are intact. Psych: Normal affect.  Assessment & Plan    Chest pain -  Hypertension -  Vertigo -   Family history of heart disease-  No BP recorded.  {Refresh Note OR Click here to enter BP  :1}***      Disposition: Follow up {follow up:15908} with Jodelle Red, MD or APP.  Signed, Alver Sorrow, NP 01/06/2023, 9:03 AM [facility]

## 2023-01-08 ENCOUNTER — Ambulatory Visit: Payer: Medicaid Other | Admitting: Orthopedic Surgery

## 2023-01-14 ENCOUNTER — Other Ambulatory Visit: Payer: Self-pay | Admitting: Family Medicine

## 2023-01-14 DIAGNOSIS — I10 Essential (primary) hypertension: Secondary | ICD-10-CM

## 2023-01-14 DIAGNOSIS — E7800 Pure hypercholesterolemia, unspecified: Secondary | ICD-10-CM

## 2023-01-20 ENCOUNTER — Ambulatory Visit (INDEPENDENT_AMBULATORY_CARE_PROVIDER_SITE_OTHER): Payer: Medicaid Other | Admitting: Orthopedic Surgery

## 2023-01-20 DIAGNOSIS — M1712 Unilateral primary osteoarthritis, left knee: Secondary | ICD-10-CM

## 2023-01-20 DIAGNOSIS — M25461 Effusion, right knee: Secondary | ICD-10-CM | POA: Diagnosis not present

## 2023-01-20 DIAGNOSIS — M1710 Unilateral primary osteoarthritis, unspecified knee: Secondary | ICD-10-CM

## 2023-01-20 DIAGNOSIS — M25551 Pain in right hip: Secondary | ICD-10-CM | POA: Diagnosis not present

## 2023-01-20 DIAGNOSIS — M25552 Pain in left hip: Secondary | ICD-10-CM | POA: Diagnosis not present

## 2023-01-20 DIAGNOSIS — M1711 Unilateral primary osteoarthritis, right knee: Secondary | ICD-10-CM

## 2023-01-20 NOTE — Progress Notes (Signed)
Office Visit Note   Patient: Heidi Barrett           Date of Birth: 08-Jun-1966           MRN: [id number] Visit Date: 01/20/2023 Requested by: Hoy Register, MD [address] [place] [place],  [place] [address] PCP: Hoy Register, MD  Subjective: Chief Complaint  Patient presents with   Right Hip - Follow-up   Right Knee - Pain    HPI: Heidi Barrett is a 57 y.o. female who presents to the office reporting right hip pain as well as right knee pain.  She had a right knee injection 08/02/2022 and would like to have both knees injected today.  There is helped her knees.  She also describes right posterior and lateral hip pain as well as twinge of groin pain.  She has had a right hip MRI scan in April of this year which demonstrated edema in the abductor brevis but no hip fracture or significant arthritis or effusion..                ROS: All systems reviewed are negative as they relate to the chief complaint within the history of present illness.  Patient denies fevers or chills.  Assessment & Plan: Visit Diagnoses:  1. Bilateral hip pain   2. Effusion, right knee     Plan: Impression is hip strain which is muscular in origin with no intra-articular pain generators.  This should get better on its own.  Patient also has known history of bilateral knee arthritis.  Injections have helped her in the past.  Repeat injections performed today.  Follow-up as needed.  Follow-Up Instructions: No follow-ups on file.   Orders:  No orders of the defined types were placed in this encounter.  No orders of the defined types were placed in this encounter.     Procedures: Large Joint Inj: bilateral knee on 01/20/2023 10:38 PM Indications: diagnostic evaluation, joint swelling and pain Details: 18 G 1.5 in needle, superolateral approach  Arthrogram: No  Medications (Right): 5 mL lidocaine 1 %; 4 mL bupivacaine 0.25 %; 40 mg methylPREDNISolone acetate 40  MG/ML Medications (Left): 5 mL lidocaine 1 %; 4 mL bupivacaine 0.25 %; 40 mg methylPREDNISolone acetate 40 MG/ML Outcome: tolerated well, no immediate complications Procedure, treatment alternatives, risks and benefits explained, specific risks discussed. Consent was given by the patient. Immediately prior to procedure a time out was called to verify the correct patient, procedure, equipment, support staff and site/side marked as required. Patient was prepped and draped in the usual sterile fashion.       Clinical Data: No additional findings.  Objective: Vital Signs: There were no vitals taken for this visit.  Physical Exam:  Constitutional: Patient appears well-developed HEENT:  Head: Normocephalic Eyes:EOM are normal Neck: Normal range of motion Cardiovascular: Normal rate Pulmonary/chest: Effort normal Neurologic: Patient is alert Skin: Skin is warm Psychiatric: Patient has normal mood and affect  Ortho Exam: Ortho exam demonstrates normal gait and alignment.  Not too much groin pain on the right-hand side with internal and external rotation of the leg.  Hip flexion abduction adduction strength is intact.  Both knees have slight flexion contractures but no effusion.  Extensor mechanism intact.  Medial and lateral joint line tenderness is present in both knees.  Pedal pulses palpable  Specialty Comments:  No specialty comments available.  Imaging: No results found.   PMFS History: Patient Active Problem List  Diagnosis Date Noted   Urge incontinence of urine 07/18/2022   Palpitation 09/10/2017   Atypical chest pain 07/12/2016   Generalized anxiety disorder 07/04/2016   Right lower quadrant abdominal pain 02/08/2016   Eye muscle twitches 02/08/2016   Fibroid, uterine 02/08/2016   Piriformis syndrome of right side 01/31/2015   Allergic reaction caused by a drug 01/27/2015   Heel spur 01/16/2015   Essential hypertension, benign 01/16/2015   Benign essential HTN  05/31/2014   Plantar fasciitis 05/31/2014   Other specified hypothyroidism 05/31/2014   Intramural leiomyoma of uterus 05/31/2014   Menorrhagia 01/04/2013   Fibroids 01/04/2013   Past Medical History:  Diagnosis Date   Anemia    Hypertension    Thyroid disease     Family History  Problem Relation Age of Onset   Heart disease Mother    Stroke Mother    Cancer Father        prostate    Past Surgical History:  Procedure Laterality Date   IR GENERIC HISTORICAL  05/28/2016   IR RADIOLOGIST EVAL & MGMT 05/28/2016 Richarda Overlie, MD [facility] INTERV RAD   IR RADIOLOGIST EVAL & MGMT  03/06/2017   IR RADIOLOGIST EVAL & MGMT  02/17/2018   Social History   Occupational History   Not on file  Tobacco Use   Smoking status: Never   Smokeless tobacco: Never  Vaping Use   Vaping Use: Never used  Substance and Sexual Activity   Alcohol use: No   Drug use: No   Sexual activity: Never    Birth control/protection: None

## 2023-01-25 ENCOUNTER — Encounter: Payer: Self-pay | Admitting: Orthopedic Surgery

## 2023-01-25 MED ORDER — BUPIVACAINE HCL 0.25 % IJ SOLN
4.0000 mL | INTRAMUSCULAR | Status: AC | PRN
Start: 2023-01-20 — End: 2023-01-20
  Administered 2023-01-20: 4 mL via INTRA_ARTICULAR

## 2023-01-25 MED ORDER — METHYLPREDNISOLONE ACETATE 40 MG/ML IJ SUSP
40.0000 mg | INTRAMUSCULAR | Status: AC | PRN
Start: 2023-01-20 — End: 2023-01-20
  Administered 2023-01-20: 40 mg via INTRA_ARTICULAR

## 2023-01-25 MED ORDER — LIDOCAINE HCL 1 % IJ SOLN
5.0000 mL | INTRAMUSCULAR | Status: AC | PRN
Start: 2023-01-20 — End: 2023-01-20
  Administered 2023-01-20: 5 mL

## 2023-01-27 ENCOUNTER — Other Ambulatory Visit: Payer: Self-pay | Admitting: Family Medicine

## 2023-01-28 ENCOUNTER — Other Ambulatory Visit: Payer: Self-pay | Admitting: Family Medicine

## 2023-01-28 ENCOUNTER — Telehealth: Payer: Self-pay | Admitting: Family Medicine

## 2023-01-28 DIAGNOSIS — G8929 Other chronic pain: Secondary | ICD-10-CM

## 2023-01-28 NOTE — Telephone Encounter (Signed)
Requested medication (s) are due for refill today: yes  Requested medication (s) are on the active medication list: yes  Last refill:  12/27/22  Future visit scheduled: yes  Notes to clinic:  Unable to refill per protocol, cannot delegate.      Requested Prescriptions  Pending Prescriptions Disp Refills   meclizine (ANTIVERT) 12.5 MG tablet [Pharmacy Med Name: MECLIZINE 12.5MG  TAB*RX 12.5 Tablet] 30 tablet 10    Sig: TAKE 1 TABLET BY MOUTH THREE TIMES DAILY AS NEEDED FOR DIZZINESS     Not Delegated - Gastroenterology: Antiemetics Failed - 01/27/2023 11:15 AM      Failed - This refill cannot be delegated      Passed - Valid encounter within last 6 months    Recent Outpatient Visits           3 months ago Vertigo   [facility] [place], Odette Horns, MD   6 months ago Overactive bladder   [facility] Hoy Register, MD   11 months ago Hypercholesterolemia   [facility] Hoy Register, MD   1 year ago Uterine leiomyoma, unspecified location   [facility] Hoy Register, MD   1 year ago Acute abdominal pain   [facility] Marcine Matar, MD       Future Appointments             In 1 week Hoy Register, MD [facility]   In 2 weeks Hoy Register, MD [facility]   In 3 weeks Alver Sorrow, NP  Heart & Vascular at [facility], [place]

## 2023-01-28 NOTE — Telephone Encounter (Signed)
Copied from CRM [id number]. Topic: General - Other >> Jan 28, 2023  9:07 AM Everette C wrote: Reason for CRM: Stephanie with [facility] has called to follow up on a Phys[employer] order for equipment originally faxed on 01/16/23  Please contact further to confirm receipt of paperwork and discuss when possible

## 2023-01-29 ENCOUNTER — Other Ambulatory Visit: Payer: Self-pay | Admitting: Family Medicine

## 2023-01-29 DIAGNOSIS — G8929 Other chronic pain: Secondary | ICD-10-CM

## 2023-01-29 NOTE — Telephone Encounter (Signed)
Order has been faxed

## 2023-01-29 NOTE — Telephone Encounter (Signed)
Medication Refill - Medication: meclizine (ANTIVERT) 12.5 MG tablet and gabapentin (NEURONTIN) 300 MG capsule    Has the patient contacted their pharmacy? Yes.    Preferred Pharmacy (with phone number or street name):  [facility] [address], [place] - [address] Phone: [phone]  Fax: [phone]     Has the patient been seen for an appointment in the last year OR does the patient have an upcoming appointment? Yes.    Agent: Please be advised that RX refills may take up to 3 business days. We ask that you follow-up with your pharmacy.

## 2023-01-30 MED ORDER — MECLIZINE HCL 12.5 MG PO TABS: ORAL_TABLET | ORAL | 0 refills | Status: DC

## 2023-01-30 NOTE — Telephone Encounter (Signed)
Requested medication (s) are due for refill today:   Yes   Antivert provider to review  Requested medication (s) are on the active medication list:   Yes for both  Future visit scheduled:   Yes with Dr. Alvis Lemmings in 6 days   Last ordered: Antivert 01/28/2023 #30, 0 refills;   Gabapentin 01/29/2023  #180, 10 refills  Returned because it's a non delegated refill.    Both of these are duplicate requests  Requested Prescriptions  Pending Prescriptions Disp Refills   gabapentin (NEURONTIN) 300 MG capsule 180 capsule 1    Sig: Take 2 capsules (600 mg total) by mouth at bedtime.     Neurology: Anticonvulsants - gabapentin Failed - 01/29/2023  2:03 PM      Failed - Cr in normal range and within 360 days    Creat  Date Value Ref Range Status  01/16/2015 0.82 0.50 - 1.10 mg/dL Final   Creatinine, Ser  Date Value Ref Range Status  09/26/2022 1.10 (H) 0.44 - 1.00 mg/dL Final         Passed - Completed PHQ-2 or PHQ-9 in the last 360 days      Passed - Valid encounter within last 12 months    Recent Outpatient Visits           3 months ago Vertigo   [place] Hoy Register, MD   6 months ago Overactive bladder   [facility] Hoy Register, MD   11 months ago Hypercholesterolemia   [facility] Hoy Register, MD   1 year ago Uterine leiomyoma, unspecified location   [facility] Hoy Register, MD   1 year ago Acute abdominal pain   [facility] Marcine Matar, MD       Future Appointments             In 6 days Hoy Register, MD [facility]   In 1 week Hoy Register, MD [facility]   In 2 weeks Alver Sorrow, NP [place] [facility] at [facility]             meclizine (ANTIVERT) 12.5 MG tablet 30  tablet 0     Not Delegated - Gastroenterology: Antiemetics Failed - 01/29/2023  2:03 PM      Failed - This refill cannot be delegated      Passed - Valid encounter within last 6 months    Recent Outpatient Visits           3 months ago Vertigo   [facility] Hoy Register, MD   6 months ago Overactive bladder   [facility] Hoy Register, MD   11 months ago Hypercholesterolemia   [facility] Hoy Register, MD   1 year ago Uterine leiomyoma, unspecified location   [facility] Hoy Register, MD   1 year ago Acute abdominal pain   [facility] Marcine Matar, MD       Future Appointments             In 6 days Hoy Register, MD [facility]   In 1 week Hoy Register, MD [facility]   In 2 weeks Dan Humphreys, Storm Frisk, NP [facility] Heart & Vascular at [facility], [place]

## 2023-02-05 ENCOUNTER — Ambulatory Visit: Payer: Commercial Managed Care - HMO | Admitting: Family Medicine

## 2023-02-07 ENCOUNTER — Other Ambulatory Visit: Payer: Self-pay | Admitting: Family Medicine

## 2023-02-07 DIAGNOSIS — G8929 Other chronic pain: Secondary | ICD-10-CM

## 2023-02-12 ENCOUNTER — Ambulatory Visit: Payer: Medicaid Other | Attending: Family Medicine | Admitting: Family Medicine

## 2023-02-12 ENCOUNTER — Encounter: Payer: Self-pay | Admitting: Family Medicine

## 2023-02-12 VITALS — BP 124/84 | HR 76 | Temp 98.2°F | Ht 68.0 in | Wt 238.0 lb

## 2023-02-12 DIAGNOSIS — G8929 Other chronic pain: Secondary | ICD-10-CM

## 2023-02-12 DIAGNOSIS — I10 Essential (primary) hypertension: Secondary | ICD-10-CM | POA: Diagnosis not present

## 2023-02-12 DIAGNOSIS — M5441 Lumbago with sciatica, right side: Secondary | ICD-10-CM | POA: Diagnosis not present

## 2023-02-12 DIAGNOSIS — R42 Dizziness and giddiness: Secondary | ICD-10-CM | POA: Diagnosis not present

## 2023-02-12 DIAGNOSIS — E668 Other obesity: Secondary | ICD-10-CM

## 2023-02-12 DIAGNOSIS — E7800 Pure hypercholesterolemia, unspecified: Secondary | ICD-10-CM | POA: Diagnosis not present

## 2023-02-12 DIAGNOSIS — E038 Other specified hypothyroidism: Secondary | ICD-10-CM

## 2023-02-12 DIAGNOSIS — Z131 Encounter for screening for diabetes mellitus: Secondary | ICD-10-CM

## 2023-02-12 DIAGNOSIS — M5442 Lumbago with sciatica, left side: Secondary | ICD-10-CM

## 2023-02-12 LAB — POCT GLYCOSYLATED HEMOGLOBIN (HGB A1C): Hemoglobin A1C: 4.9 % (ref 4.0–5.6)

## 2023-02-12 MED ORDER — MISC. DEVICES MISC: 0 refills | Status: AC

## 2023-02-12 MED ORDER — AMLODIPINE BESYLATE 10 MG PO TABS
ORAL_TABLET | ORAL | 1 refills | Status: DC
Start: 2023-02-12 — End: 2023-06-26

## 2023-02-12 MED ORDER — MECLIZINE HCL 12.5 MG PO TABS: ORAL_TABLET | ORAL | 1 refills | Status: DC

## 2023-02-12 MED ORDER — ATORVASTATIN CALCIUM 20 MG PO TABS: 20.0000 mg | ORAL_TABLET | Freq: Every day | ORAL | 1 refills | Status: DC

## 2023-02-12 MED ORDER — LEVOTHYROXINE SODIUM 125 MCG PO TABS
125.0000 ug | ORAL_TABLET | Freq: Every day | ORAL | 1 refills | Status: DC
Start: 2023-02-12 — End: 2023-06-27

## 2023-02-12 MED ORDER — TIZANIDINE HCL 4 MG PO TABS: 4.0000 mg | ORAL_TABLET | Freq: Two times a day (BID) | ORAL | 1 refills | Status: DC

## 2023-02-12 MED ORDER — DULOXETINE HCL 60 MG PO CPEP: 60.0000 mg | ORAL_CAPSULE | Freq: Every day | ORAL | 1 refills | Status: DC

## 2023-02-12 NOTE — Progress Notes (Signed)
Subjective:  Patient ID: Heidi Barrett, female    DOB: 1966/08/05  Age: 57 y.o. MRN: [id number]  CC: Dizziness   HPI Heidi Barrett is a 57 y.o. year old female with a history of hypertension, menorrhagia secondary to fibroids, hypothyroidism, degenerative disease of the lumbar spine, low back pain with sciatica, chronic right knee pain, vertigo.   Interval History: Discussed the use of AI scribe software for clinical note transcription with the patient, who gave verbal consent to proceed.  She presents for a follow-up visit. The patient reports that her vertigo is somewhat controlled with meclizine, but symptoms return when she attempt to stop the medication. The patient agrees to a referral for vestibular rehab.  The patient's knee pain continues to be a significant issue. Despite receiving injections and using an exercise bike as recommended by an orthopedic doctor, the patient reports increased difficulty and pain with bending and sitting. The patient is considering trying an elliptical machine but is hesitant to invest in one without knowing if it will exacerbate the pain. The patient also expresses frustration with the limitations the pain places on their ability to exercise and lose weight.  The patient's back pain is also a significant concern, rated as a nine on a scale of one to ten. The patient believes the pain may be due to sciatica. The patient has not received injections for the back pain.  She is requesting prescription for shower chair.  The patient has gained significant weight in the past five months, which they attribute to stress eating due to personal and family health concerns. The patient expresses interest in weight loss injections but is concerned about potential interactions with their current medications. The patient also expresses a desire to lose weight to alleviate the strain on their back and knees.  Of note she has gained 25 pounds in the  last 5 months.   The patient is currently taking gabapentin for pain, tizanidine as a muscle relaxant, and medications for anxiety and depression which she states have been beneficial. The patient also takes levothyroxine for hypothyroidism.       Past Medical History:  Diagnosis Date   Anemia    Hypertension    Thyroid disease     Past Surgical History:  Procedure Laterality Date   IR GENERIC HISTORICAL  05/28/2016   IR RADIOLOGIST EVAL & MGMT 05/28/2016 Richarda Overlie, MD [facility] INTERV RAD   IR RADIOLOGIST EVAL & MGMT  03/06/2017   IR RADIOLOGIST EVAL & MGMT  02/17/2018    Family History  Problem Relation Age of Onset   Heart disease Mother    Stroke Mother    Cancer Father        prostate    Social History   Socioeconomic History   Marital status: Single    Spouse name: Not on file   Number of children: Not on file   Years of education: Not on file   Highest education level: Not on file  Occupational History   Not on file  Tobacco Use   Smoking status: Never   Smokeless tobacco: Never  Vaping Use   Vaping Use: Never used  Substance and Sexual Activity   Alcohol use: No   Drug use: No   Sexual activity: Never    Birth control/protection: None  Other Topics Concern   Not on file  Social History Narrative   Not on file   Social Determinants of Health   Financial Resource Strain:  Not on file  Food Insecurity: Food Insecurity Present (07/17/2022)   Hunger Vital Sign    Worried About Running Out of Food in the Last Year: Sometimes true    Ran Out of Food in the Last Year: Sometimes true  Transportation Needs: Unmet Transportation Needs (07/17/2022)   PRAPARE - Administrator, Civil Service (Medical): Yes    Lack of Transportation (Non-Medical): Yes  Physical Activity: Not on file  Stress: Not on file  Social Connections: Not on file    Allergies  Allergen Reactions   Hctz [Hydrochlorothiazide] Shortness Of Breath and Other (See Comments)     wheezing   Robaxin [Methocarbamol] Swelling    Sensation of throat swelling/difficulty swallowing after taking this medication   Tramadol Anaphylaxis and Swelling   Hydrocodone Nausea And Vomiting    dizzines dizzines   Oxycodone-Acetaminophen Nausea And Vomiting    weakness   Percocet [Oxycodone-Acetaminophen] Nausea And Vomiting and Other (See Comments)    weakness    Outpatient Medications Prior to Visit  Medication Sig Dispense Refill   albuterol (PROVENTIL HFA) 108 (90 Base) MCG/ACT inhaler Inhale 2 puffs into the lungs every 4 (four) hours as needed for wheezing or shortness of breath. 6.7 g 2   calcium carbonate (CALCIUM 600) 600 MG TABS tablet Take 1 tablet (600 mg total) by mouth daily with breakfast. 60 tablet 3   EPINEPHrine 0.3 mg/0.3 mL IJ SOAJ injection Inject 0.3 mg into the muscle as needed for anaphylaxis. 1 each prn   fluticasone (FLONASE) 50 MCG/ACT nasal spray SHAKE LIQUID AND USE 2 SPRAYS IN EACH NOSTRIL DAILY 16 g 1   gabapentin (NEURONTIN) 300 MG capsule TAKE 2 CAPSULES BY MOUTH AT BEDTIME 180 capsule 10   hydrOXYzine (ATARAX) 25 MG tablet Take 1 tablet (25 mg total) by mouth 2 (two) times daily as needed. 60 tablet 6   ibuprofen (ADVIL) 800 MG tablet Take 1 tablet (800 mg total) by mouth 3 (three) times daily. 21 tablet 0   levothyroxine (SYNTHROID) 125 MCG tablet Take 1 tablet (125 mcg total) by mouth daily before breakfast. 90 tablet 1   metoprolol tartrate (LOPRESSOR) 50 MG tablet TAKE 1 TABLET BY MOUTH TWICE DAILY 60 tablet 0   Misc. Devices MISC 4 pronged cane. Diagnosis right knee degenerative changes 1 each 0   Misc. Devices MISC Rolling walker with seat. Diagnosis Right knee degenerative changes 1 each 0   Misc. Devices MISC Depends, wipes, bed pads. Diagnosis : urinary incontinence. 1 each 0   Multiple Vitamin (MULTIVITAMIN WITH MINERALS) TABS tablet Take 1 tablet by mouth daily. 90 tablet 3   solifenacin (VESICARE) 5 MG tablet Take 1 tablet (5 mg total)  by mouth daily. 90 tablet 1   amLODipine (NORVASC) 10 MG tablet TAKE 1 TABLET BY MOUTH DAILY FOR BLOOD PRESSURE 30 tablet 0   atorvastatin (LIPITOR) 20 MG tablet TAKE 1 TABLET(20 MG) BY MOUTH DAILY 90 tablet 0   cyclobenzaprine (FLEXERIL) 5 MG tablet Take 1 tablet (5 mg total) by mouth 3 (three) times daily as needed. 10 tablet 0   DULoxetine (CYMBALTA) 60 MG capsule TAKE 1 CAPSULE(60 MG) BY MOUTH DAILY FOR CHRONIC PAIN 30 capsule 0   meclizine (ANTIVERT) 12.5 MG tablet TAKE 1 TABLET BY MOUTH THREE TIMES DAILY AS NEEDED FOR DIZZINESS 30 tablet 0   tiZANidine (ZANAFLEX) 4 MG tablet Take 1 tablet (4 mg total) by mouth 2 (two) times daily. 60 tablet 6   No facility-administered medications prior  to visit.     ROS Review of Systems  Constitutional:  Negative for activity change and appetite change.  HENT:  Negative for sinus pressure and sore throat.   Respiratory:  Negative for chest tightness, shortness of breath and wheezing.   Cardiovascular:  Negative for chest pain and palpitations.  Gastrointestinal:  Negative for abdominal distention, abdominal pain and constipation.  Genitourinary: Negative.   Musculoskeletal:        See HPI  Psychiatric/Behavioral:  Negative for behavioral problems and dysphoric mood.     Objective:  BP 124/84   Pulse 76   Temp 98.2 F (36.8 C) (Oral)   Ht 5\' 8"  (1.727 m)   Wt 238 lb (108 kg)   SpO2 99%   BMI 36.19 kg/m      02/12/2023    3:31 PM 10/30/2022    3:00 PM 10/30/2022    2:43 PM  BP/Weight  Systolic BP 124 131 148  Diastolic BP 84 71 92  Wt. (Lbs) 238    BMI 36.19 kg/m2      Wt Readings from Last 3 Encounters:  02/12/23 238 lb (108 kg)  10/08/22 231 lb 1.6 oz (104.8 kg)  09/26/22 213 lb (96.6 kg)      Physical Exam Constitutional:      Appearance: She is well-developed.  Cardiovascular:     Rate and Rhythm: Normal rate.     Heart sounds: Normal heart sounds. No murmur heard. Pulmonary:     Effort: Pulmonary effort is  normal.     Breath sounds: Normal breath sounds. No wheezing or rales.  Chest:     Chest wall: No tenderness.  Abdominal:     General: Bowel sounds are normal. There is no distension.     Palpations: Abdomen is soft. There is no mass.     Tenderness: There is no abdominal tenderness.  Musculoskeletal:        General: Tenderness (lumbar spine) present.     Right lower leg: No edema.     Left lower leg: No edema.     Comments: Right knee wrapped in Ace wrapped Severe tenderness on flexion and extension of right knee.  Neurological:     Mental Status: She is alert and oriented to person, place, and time.  Psychiatric:        Mood and Affect: Mood normal.        Latest Ref Rng & Units 09/26/2022    1:38 PM 09/26/2022   12:56 PM 07/18/2022   11:06 AM  CMP  Glucose 70 - 99 mg/dL 528  413  82   BUN 6 - 20 mg/dL 11  10  11    Creatinine 0.44 - 1.00 mg/dL 2.44  0.10  2.72   Sodium 135 - 145 mmol/L 136  133  142   Potassium 3.5 - 5.1 mmol/L 4.2  4.1  4.1   Chloride 98 - 111 mmol/L 101  101  101   CO2 22 - 32 mmol/L  26  27   Calcium 8.9 - 10.3 mg/dL  8.6  9.8   Total Protein 6.5 - 8.1 g/dL  6.2  7.1   Total Bilirubin 0.3 - 1.2 mg/dL  0.7  0.5   Alkaline Phos 38 - 126 U/L  46  74   AST 15 - 41 U/L  19  18   ALT 0 - 44 U/L  30  40     Lipid Panel     Component Value Date/Time  CHOL 150 02/25/2022 1038   TRIG 99 02/25/2022 1038   HDL 57 02/25/2022 1038   CHOLHDL 2.4 10/26/2020 0957   CHOLHDL 2.6 01/16/2015 1041   VLDL 9 01/16/2015 1041   LDLCALC 75 02/25/2022 1038    CBC    Component Value Date/Time   WBC 4.9 09/26/2022 1256   RBC 4.16 09/26/2022 1256   HGB 12.2 09/26/2022 1338   HGB 13.3 10/13/2017 1346   HCT 36.0 09/26/2022 1338   HCT 38.5 10/13/2017 1346   PLT 134 (L) 09/26/2022 1256   PLT 167 10/13/2017 1346   MCV 88.2 09/26/2022 1256   MCV 90 10/13/2017 1346   MCH 30.8 09/26/2022 1256   MCHC 34.9 09/26/2022 1256   RDW 12.6 09/26/2022 1256   RDW 13.6  10/13/2017 1346   LYMPHSABS 1.3 11/13/2021 1550   LYMPHSABS 1.1 05/07/2017 1120   MONOABS 0.4 11/13/2021 1550   EOSABS 0.3 11/13/2021 1550   EOSABS 0.2 05/07/2017 1120   BASOSABS 0.0 11/13/2021 1550   BASOSABS 0.0 05/07/2017 1120    Lab Results  Component Value Date   HGBA1C 4.9 02/12/2023    Lab Results  Component Value Date   TSH 1.340 07/18/2022    Assessment & Plan:      Vertigo: Persistent despite medication. Discussed the benefits of vestibular rehab therapy. -Refer to vestibular rehab therapy.  Obesity: Significant weight gain over the past five months. Discussed the impact of weight on current health issues and the potential for weight loss interventions. -Check A1C to assess for prediabetes or diabetes -A1c is normal at 4.9.  Unfortunately GLP-1 RA will not be covered by her insurance -Refer to weight management clinic.  Knee Pain: Persistent despite injections and physical therapy. Discussed the impact of weight on knee pain. -Continue current treatment plan. -Consider over-the-counter topical pain relief options like Voltaren gel.  Back Pain: Severe, rated as 9/10. Discussed the impact of weight on back pain. -Advised to apply heat or ice whichever is tolerated to painful areas. Counseled on evidence of improvement in pain control with regards to yoga, water aerobics, massage, home physical therapy, exercise as tolerated. -Consider over-the-counter topical pain relief options like Voltaren gel. -Consider restarting Tizanidine for muscle relaxation.  Anxiety and Depression: Completed PHQ-9 score of 25 up from 15 previously.  Exacerbated by recent personal stressors. Currently on Hydroxyzine and Duloxetine. -Continue current treatment plan. -Encourage exploration of non-pharmacological anxiety relief strategies.  Hypertension: Well-controlled on Amlodipine. -Continue Amlodipine.  Hypothyroidism: -Continue Levothyroxine for thyroid management. -Check thyroid  levels at next visit          Meds ordered this encounter  Medications   Misc. Devices MISC    Sig: Paediatric nurse.  Diagnosis low back pain    Dispense:  1 each    Refill:  0   amLODipine (NORVASC) 10 MG tablet    Sig: TAKE 1 TABLET BY MOUTH DAILY FOR BLOOD PRESSURE    Dispense:  90 tablet    Refill:  1   atorvastatin (LIPITOR) 20 MG tablet    Sig: Take 1 tablet (20 mg total) by mouth daily.    Dispense:  90 tablet    Refill:  1   DULoxetine (CYMBALTA) 60 MG capsule    Sig: Take 1 capsule (60 mg total) by mouth daily.    Dispense:  90 capsule    Refill:  1   meclizine (ANTIVERT) 12.5 MG tablet    Sig: TAKE 1 TABLET BY MOUTH THREE TIMES DAILY AS  NEEDED FOR DIZZINESS    Dispense:  60 tablet    Refill:  1   tiZANidine (ZANAFLEX) 4 MG tablet    Sig: Take 1 tablet (4 mg total) by mouth 2 (two) times daily.    Dispense:  180 tablet    Refill:  1    Follow-up: Return in about 1 month (around 03/15/2023) for Pap smear.       Hoy Register, MD, FAAFP. [facility] [place], [place] [phone]   02/12/2023, 5:13 PM

## 2023-02-12 NOTE — Progress Notes (Signed)
Script for shower chair Lower back and right knee pain Vertigo

## 2023-02-16 ENCOUNTER — Other Ambulatory Visit: Payer: Self-pay | Admitting: Family Medicine

## 2023-02-16 DIAGNOSIS — I10 Essential (primary) hypertension: Secondary | ICD-10-CM

## 2023-02-18 ENCOUNTER — Ambulatory Visit (HOSPITAL_BASED_OUTPATIENT_CLINIC_OR_DEPARTMENT_OTHER): Payer: Medicaid Other | Admitting: Family

## 2023-02-18 ENCOUNTER — Telehealth: Payer: Self-pay

## 2023-02-18 ENCOUNTER — Ambulatory Visit (HOSPITAL_BASED_OUTPATIENT_CLINIC_OR_DEPARTMENT_OTHER): Payer: Commercial Managed Care - HMO | Admitting: Family

## 2023-02-18 DIAGNOSIS — G8929 Other chronic pain: Secondary | ICD-10-CM

## 2023-02-18 NOTE — Progress Notes (Deleted)
Cardiology Office Note:  .   Date:  02/18/2023  ID:  Heidi Barrett, DOB 1966/08/03, MRN [id number] PCP: Heidi Register, MD  [facility] Providers Cardiologist:  Heidi Red, MD { Click to update primary MD,subspecialty MD or APP then REFRESH:1}   History of Present Illness: .   Heidi Barrett is a 57 y.o. female with a history of hypertension, hyperlipidemia, hypothyroidism, vertigo.  Established with cardiology 10/08/2022 due to chest pain.  Noted family history of coronary artery disease (mom CABG x7, dad enlarged heart, hypertension).  She report did history of heart murmur but no longer requiring medications.  She had heart cath in [place] years ago per her report due to rhythm issue unclear whether this to the EP study versus coronary angiography.  Subsequent coronary CTA 10/30/2022 with coronary calcium score of 0 no evidence of heart disease.  There was a 9 cm hypodense nodule in deep second cyst tissue of left lower back likely deep subcutaneous cyst recommended for follow-up with PCP if symptomatic.  She presents today for follow-up. ***  ROS: Please see the history of present illness.    All other systems reviewed and are negative.   Studies Reviewed: .        Cardiac Studies & Procedures     STRESS TESTS  EXERCISE TOLERANCE TEST (ETT) 07/23/2016  Narrative  Blood pressure demonstrated a hypertensive response to exercise.  There was no ST segment deviation noted during stress.  No T wave inversion was noted during stress.  Overall, the patient's exercise capacity was excellent.  The patient experienced no angina during the stress test.  [facility] Treadmill Score: low risk  Negative stress test without evidence of ischemia at given workload. Hypertensive response to exercise.      CT SCANS  CT CORONARY MORPH W/CTA COR W/SCORE 10/30/2022  Addendum 11/01/2022  4:31 PM ADDENDUM REPORT: 11/01/2022 16:29  EXAM: OVER-READ  INTERPRETATION  CT CHEST  The following report is an over-read performed by radiologist Dr. Joelene Barrett Heidi Barrett on 11/01/2022. This over-read does not include interpretation of cardiac or coronary anatomy or pathology. The coronary CTA interpretation by the cardiologist is attached.  COMPARISON:  CT 11/13/2021  FINDINGS: Imaged lung bases are clear. Lower mediastinum unremarkable. Included portions of upper abdomen are unremarkable. No acute osseous abnormality. 1.9 cm hypodense lesion in the deep subcutaneous soft tissues of the left back/lower thoracic paraspinal region, series 10, image 35.  IMPRESSION: 1.9 cm hypodense nodule in the deep subcutaneous soft tissues of the left lower back, potentially representing deep subcutaneous cyst, however suggest correlation with targeted sonography.   Electronically Signed By: Heidi Barrett M.D. On: 11/01/2022 16:29  Narrative CLINICAL DATA:  57 yo female with chest pain  EXAM: Cardiac/Coronary CTA  TECHNIQUE: A non-contrast, gated CT scan was obtained with axial slices of 3 mm through the heart for calcium scoring. Calcium scoring was performed using the Agatston method. A 120 kV prospective, gated, contrast cardiac scan was obtained. Gantry rotation speed was 250 msecs and collimation was 0.6 mm. Two sublingual nitroglycerin tablets (0.8 mg) were given. The 3D data set was reconstructed in 5% intervals of the 35-75% of the R-R cycle. Diastolic phases were analyzed on a dedicated workstation using MPR, MIP, and VRT modes. The patient received 95 cc of contrast.  FINDINGS: Image quality: Average.  Noise artifact is: Moderate. (signal-to-noise).  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  Left main: The left main is  a large caliber vessel with a normal take off from the left coronary cusp that bifurcates to form a left anterior descending artery and a left circumflex artery. There is  no plaque or stenosis.  Left anterior descending artery: The LAD is patent without evidence of plaque or stenosis. The LAD gives off 1 patent diagonal branch.  Left circumflex artery: The LCX is dominant and patent with no evidence of plaque or stenosis. The LCX gives off large, patent, branching OM and terminates in small PDA  Right coronary artery: The RCA is non-dominant; anomalous origin from LAD; courses anterior to pulmonary artery.  Right Atrium: Right atrial size is within normal limits.  Right Ventricle: The right ventricular cavity is within normal limits.  Left Atrium: Left atrial size is normal in size with no left atrial appendage filling defect.  Left Ventricle: The ventricular cavity size is within normal limits. There are no stigmata of prior infarction. There is no abnormal filling defect.  Pulmonary arteries: Normal in size without proximal filling defect.  Pulmonary veins: Normal pulmonary venous drainage.  Pericardium: Normal thickness with no significant effusion or calcium present.  Cardiac valves: The aortic valve is trileaflet without significant calcification. The mitral valve is normal structure without significant calcification.  Aorta: Normal caliber with no significant disease.  Extra-cardiac findings: See attached radiology report for non-cardiac structures.  IMPRESSION: 1. Coronary calcium score of 0. This was 0 percentile for age-, sex, and race-matched controls.  2. Left dominance.  3. No evidence of CAD.  4. 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  RECOMMENDATIONS: CAD-RADS 0: No evidence of CAD (0%). Consider non-atherosclerotic causes of chest pain.  Heidi Millers, MD  Electronically Signed: By: Heidi Barrett M.D. On: 10/30/2022 16:22          Risk Assessment/Calculations:   {Does this patient have ATRIAL FIBRILLATION?:[phone]} No BP recorded.  {Refresh Note OR Click here to  enter BP  :1}***       Physical Exam:   VS:  There were no vitals taken for this visit.   Wt Readings from Last 3 Encounters:  02/12/23 238 lb (108 kg)  10/08/22 231 lb 1.6 oz (104.8 kg)  09/26/22 213 lb (96.6 kg)    GEN: Well nourished, well developed in no acute distress NECK: No JVD; No carotid bruits CARDIAC: ***RRR, no murmurs, rubs, gallops RESPIRATORY:  Clear to auscultation without rales, wheezing or rhonchi  ABDOMEN: Soft, non-tender, non-distended EXTREMITIES:  No edema; No deformity   ASSESSMENT AND PLAN: .   ***    {Are you ordering a CV Procedure (e.g. stress test, cath, DCCV, TEE, etc)?   Press F2        :[id number]}  Dispo: ***  Signed, Alver Sorrow, NP

## 2023-02-18 NOTE — Telephone Encounter (Signed)
Copied from CRM [id number]. Topic: Referral - Request for Referral >> Feb 18, 2023  4:28 PM Everette C wrote: Has patient seen PCP for this complaint? Yes.   *If NO, is insurance requiring patient see PCP for this issue before PCP can refer them? Referral for which specialty: Aquatic Physical Therapy  Preferred provider/office: Patient would like to be seen close to [facility]  Reason for referral: right knee discomfort

## 2023-02-19 NOTE — Addendum Note (Signed)
Addended by: Hoy Register on: 02/19/2023 03:01 PM   Modules accepted: Orders

## 2023-02-19 NOTE — Telephone Encounter (Signed)
 Referral has been placed. 

## 2023-02-21 ENCOUNTER — Ambulatory Visit (HOSPITAL_BASED_OUTPATIENT_CLINIC_OR_DEPARTMENT_OTHER): Payer: Commercial Managed Care - HMO | Admitting: Family

## 2023-03-06 ENCOUNTER — Ambulatory Visit: Payer: Medicaid Other | Admitting: Physical Therapy

## 2023-03-10 ENCOUNTER — Ambulatory Visit (HOSPITAL_BASED_OUTPATIENT_CLINIC_OR_DEPARTMENT_OTHER): Payer: Medicaid Other | Admitting: Family

## 2023-03-14 ENCOUNTER — Other Ambulatory Visit: Payer: Self-pay | Admitting: Family Medicine

## 2023-03-14 DIAGNOSIS — F430 Acute stress reaction: Secondary | ICD-10-CM

## 2023-03-14 DIAGNOSIS — F32A Depression, unspecified: Secondary | ICD-10-CM

## 2023-03-17 ENCOUNTER — Ambulatory Visit (HOSPITAL_BASED_OUTPATIENT_CLINIC_OR_DEPARTMENT_OTHER): Payer: Medicaid Other | Admitting: Cardiology

## 2023-03-17 ENCOUNTER — Ambulatory Visit: Payer: Medicaid Other | Admitting: Family Medicine

## 2023-03-17 NOTE — Telephone Encounter (Signed)
Request is too soon for refill. Last refill 10/17/22 for 30 and 6 refills.  Requested Prescriptions  Pending Prescriptions Disp Refills   hydrOXYzine (ATARAX) 25 MG tablet [Pharmacy Med Name: HYDROXYzine 25mg  tab 25 Tablet] 60 tablet 10    Sig: TAKE 1 TABLET BY MOUTH TWICE DAILY AS NEEDED     Ear, Nose, and Throat:  Antihistamines 2 Failed - 03/14/2023  7:16 PM      Failed - Cr in normal range and within 360 days    Creat  Date Value Ref Range Status  01/16/2015 0.82 0.50 - 1.10 mg/dL Final   Creatinine, Ser  Date Value Ref Range Status  09/26/2022 1.10 (H) 0.44 - 1.00 mg/dL Final         Passed - Valid encounter within last 12 months    Recent Outpatient Visits           1 month ago Screening for diabetes mellitus   [facility] [place], Odette Horns, MD   5 months ago Vertigo   [facility] Hoy Register, MD   8 months ago Overactive bladder   [facility] Hoy Register, MD   1 year ago Hypercholesterolemia   [facility] Hoy Register, MD   1 year ago Uterine leiomyoma, unspecified location   [facility] Hoy Register, MD       Future Appointments             Tomorrow Hoy Register, MD [facility]

## 2023-03-18 ENCOUNTER — Telehealth (HOSPITAL_BASED_OUTPATIENT_CLINIC_OR_DEPARTMENT_OTHER): Payer: Medicaid Other | Admitting: Family Medicine

## 2023-03-18 ENCOUNTER — Encounter: Payer: Self-pay | Admitting: Family Medicine

## 2023-03-18 ENCOUNTER — Ambulatory Visit: Payer: Self-pay

## 2023-03-18 DIAGNOSIS — K0889 Other specified disorders of teeth and supporting structures: Secondary | ICD-10-CM

## 2023-03-18 DIAGNOSIS — R42 Dizziness and giddiness: Secondary | ICD-10-CM

## 2023-03-18 MED ORDER — AMOXICILLIN-POT CLAVULANATE 875-125 MG PO TABS: 1.0000 | ORAL_TABLET | Freq: Two times a day (BID) | ORAL | 0 refills | Status: AC

## 2023-03-18 MED ORDER — IBUPROFEN 600 MG PO TABS: 600.0000 mg | ORAL_TABLET | Freq: Two times a day (BID) | ORAL | 0 refills | Status: DC | PRN

## 2023-03-18 NOTE — Progress Notes (Signed)
Virtual Visit via Video Note  I connected with Heidi Barrett, on 03/18/2023 at 3:19 PM by video enabled telemedicine device and verified that I am speaking with the correct person using two identifiers.   Consent: I discussed the limitations, risks, security and privacy concerns of performing an evaluation and management service by telemedicine and the availability of in person appointments. I also discussed with the patient that there may be a patient responsible charge related to this service. The patient expressed understanding and agreed to proceed.   Location of Patient: Home  Location of Provider: Clinic   Persons participating in Telemedicine visit: MAYANI HATTAN- Heidi Barrett Dr. Alvis Lemmings     History of Present Illness: Heidi Barrett- Heidi Barrett is a 57 y.o. year old female  with a history of hypertension, menorrhagia secondary to fibroids, hypothyroidism, degenerative disease of the lumbar spine, low back pain with sciatica, chronic right knee pain, vertigo.   Discussed the use of AI scribe software for clinical note transcription with the patient, who gave verbal consent to proceed.  She presents with left ear pain and a sensation of a lump in the left side of her throat. She suspects that these symptoms may be related to a dental infection, as she has been experiencing severe toothache in the lower left side of her mouth. She rates the pain as an 8 out of 10. She has been unable to attend dental appointments due to issues with medical transportation. She has a dental appointment scheduled for September 3rd. She has previously been prescribed antibiotics and ibuprofen for the toothache, but currently has no pain medication. She also reports that she has been unable to attend appointments for her vertigo due to the same transportation issues and states sometimes negative to pick her up for her appointment.        Past Medical History:  Diagnosis Date   Anemia     Hypertension    Thyroid disease    Allergies  Allergen Reactions   Hctz [Hydrochlorothiazide] Shortness Of Breath and Other (See Comments)    wheezing   Robaxin [Methocarbamol] Swelling    Sensation of throat swelling/difficulty swallowing after taking this medication   Tramadol Anaphylaxis and Swelling   Hydrocodone Nausea And Vomiting    dizzines dizzines   Oxycodone-Acetaminophen Nausea And Vomiting    weakness   Percocet [Oxycodone-Acetaminophen] Nausea And Vomiting and Other (See Comments)    weakness    Current Outpatient Medications on File Prior to Visit  Medication Sig Dispense Refill   albuterol (PROVENTIL HFA) 108 (90 Base) MCG/ACT inhaler Inhale 2 puffs into the lungs every 4 (four) hours as needed for wheezing or shortness of breath. 6.7 g 2   amLODipine (NORVASC) 10 MG tablet TAKE 1 TABLET BY MOUTH DAILY FOR BLOOD PRESSURE 90 tablet 1   atorvastatin (LIPITOR) 20 MG tablet Take 1 tablet (20 mg total) by mouth daily. 90 tablet 1   calcium carbonate (CALCIUM 600) 600 MG TABS tablet Take 1 tablet (600 mg total) by mouth daily with breakfast. 60 tablet 3   DULoxetine (CYMBALTA) 60 MG capsule Take 1 capsule (60 mg total) by mouth daily. 90 capsule 1   EPINEPHrine 0.3 mg/0.3 mL IJ SOAJ injection Inject 0.3 mg into the muscle as needed for anaphylaxis. 1 each prn   fluticasone (FLONASE) 50 MCG/ACT nasal spray SHAKE LIQUID AND USE 2 SPRAYS IN EACH NOSTRIL DAILY 16 g 1   gabapentin (NEURONTIN) 300 MG capsule TAKE 2 CAPSULES BY  MOUTH AT BEDTIME 180 capsule 10   hydrOXYzine (ATARAX) 25 MG tablet Take 1 tablet (25 mg total) by mouth 2 (two) times daily as needed. 60 tablet 6   ibuprofen (ADVIL) 800 MG tablet Take 1 tablet (800 mg total) by mouth 3 (three) times daily. 21 tablet 0   levothyroxine (SYNTHROID) 125 MCG tablet Take 1 tablet (125 mcg total) by mouth daily before breakfast. 90 tablet 1   meclizine (ANTIVERT) 12.5 MG tablet TAKE 1 TABLET BY MOUTH THREE TIMES DAILY AS  NEEDED FOR DIZZINESS 60 tablet 1   metoprolol tartrate (LOPRESSOR) 50 MG tablet TAKE 1 TABLET BY MOUTH TWICE DAILY 60 tablet 10   Misc. Devices MISC 4 pronged cane. Diagnosis right knee degenerative changes 1 each 0   Misc. Devices MISC Rolling walker with seat. Diagnosis Right knee degenerative changes 1 each 0   Misc. Devices MISC Depends, wipes, bed pads. Diagnosis : urinary incontinence. 1 each 0   Misc. Devices MISC Shower chair.  Diagnosis low back pain 1 each 0   Multiple Vitamin (MULTIVITAMIN WITH MINERALS) TABS tablet Take 1 tablet by mouth daily. 90 tablet 3   solifenacin (VESICARE) 5 MG tablet Take 1 tablet (5 mg total) by mouth daily. 90 tablet 1   tiZANidine (ZANAFLEX) 4 MG tablet Take 1 tablet (4 mg total) by mouth 2 (two) times daily. 180 tablet 1   No current facility-administered medications on file prior to visit.    ROS: See HPI  Observations/Objective: Awake, alert, oriented x3 Not in acute distress Normal mood      Latest Ref Rng & Units 09/26/2022    1:38 PM 09/26/2022   12:56 PM 07/18/2022   11:06 AM  CMP  Glucose 70 - 99 mg/dL 409  811  82   BUN 6 - 20 mg/dL 11  10  11    Creatinine 0.44 - 1.00 mg/dL 9.14  7.82  9.56   Sodium 135 - 145 mmol/L 136  133  142   Potassium 3.5 - 5.1 mmol/L 4.2  4.1  4.1   Chloride 98 - 111 mmol/L 101  101  101   CO2 22 - 32 mmol/L  26  27   Calcium 8.9 - 10.3 mg/dL  8.6  9.8   Total Protein 6.5 - 8.1 g/dL  6.2  7.1   Total Bilirubin 0.3 - 1.2 mg/dL  0.7  0.5   Alkaline Phos 38 - 126 U/L  46  74   AST 15 - 41 U/L  19  18   ALT 0 - 44 U/L  30  40     Lipid Panel     Component Value Date/Time   CHOL 150 02/25/2022 1038   TRIG 99 02/25/2022 1038   HDL 57 02/25/2022 1038   CHOLHDL 2.4 10/26/2020 0957   CHOLHDL 2.6 01/16/2015 1041   VLDL 9 01/16/2015 1041   LDLCALC 75 02/25/2022 1038   LABVLDL 18 02/25/2022 1038    Lab Results  Component Value Date   HGBA1C 4.9 02/12/2023     Assessment and Plan:     Dental  Pain Severe toothache on the left side, possibly related to untreated cavities. Pain radiating to the ear and associated with a palpable lymph node -Prescribe antibiotic and ibuprofen 600mg  for pain management until dental appointment on September 3rd. -Encourage patient to follow up with dentist as scheduled.   Vertigo Missed vestibular rehab appointment due to transportation issues. -Reschedule vestibular rehab appointment when transportation is available.  Overdue Pap Smear Patient overdue for routine Pap smear. -Schedule Pap smear appointment as soon as possible.  General Health Maintenance -Plan fasting cholesterol test at next visit.        Follow Up Instructions: 1 month for Pap smear   I discussed the assessment and treatment plan with the patient. The patient was provided an opportunity to ask questions and all were answered. The patient agreed with the plan and demonstrated an understanding of the instructions.   The patient was advised to call back or seek an in-person evaluation if the symptoms worsen or if the condition fails to improve as anticipated.     I provided 15 minutes total of Telehealth time during this encounter including median intraservice time, reviewing previous notes, investigations, ordering medications, medical decision making, coordinating care and patient verbalized understanding at the end of the visit.     Hoy Register, MD, FAAFP. [facility] [place], [place] [phone]   03/18/2023, 3:19 PM

## 2023-03-18 NOTE — Telephone Encounter (Signed)
    Chief Complaint: Sore throat, ear pain. Noticed a lump on left side of neck. Encouraged to do a home COVID test if possible. Mother is sick as well. Has a virtual visit today already. Symptoms: Above Frequency: Today Pertinent Negatives: Patient denies fever Disposition: [] ED /[] Urgent Care (no appt availability in office) / [x] Appointment(In office/virtual)/ []  [place] [facility]/ [] Home Care/ [] Refused Recommended Disposition /[] [place] [facility]/ []  Follow-up with PCP Additional Notes: Pt. Has VV today.  Reason for Disposition  Earache also present  Answer Assessment - Initial Assessment Questions 1. ONSET: "When did the throat start hurting?" (Hours or days ago)      Today 2. SEVERITY: "How bad is the sore throat?" (Scale 1-10; mild, moderate or severe)   - MILD (1-3):  Doesn't interfere with eating or normal activities.   - MODERATE (4-7): Interferes with eating some solids and normal activities.   - SEVERE (8-10):  Excruciating pain, interferes with most normal activities.   - SEVERE WITH DYSPHAGIA (10): Can't swallow liquids, drooling.     Mild  3. STREP EXPOSURE: "Has there been any exposure to strep within the past week?" If Yes, ask: "What type of contact occurred?"      No 4.  VIRAL SYMPTOMS: "Are there any symptoms of a cold, such as a runny nose, cough, hoarse voice or red eyes?"      Runny nose, ear pain 5. FEVER: "Do you have a fever?" If Yes, ask: "What is your temperature, how was it measured, and when did it start?"     No 6. PUS ON THE TONSILS: "Is there pus on the tonsils in the back of your throat?"     Unsure 7. OTHER SYMPTOMS: "Do you have any other symptoms?" (e.g., difficulty breathing, headache, rash)     Lump on left side 8. PREGNANCY: "Is there any chance you are pregnant?" "When was your last menstrual period?"     No  Protocols used: Sore Throat-A-AH

## 2023-03-20 ENCOUNTER — Ambulatory Visit: Payer: Medicaid Other | Attending: Family Medicine | Admitting: Physical Therapy

## 2023-04-08 ENCOUNTER — Ambulatory Visit (HOSPITAL_BASED_OUTPATIENT_CLINIC_OR_DEPARTMENT_OTHER): Payer: Medicaid Other | Admitting: Physical Therapy

## 2023-04-30 ENCOUNTER — Other Ambulatory Visit: Payer: Self-pay | Admitting: Family Medicine

## 2023-04-30 ENCOUNTER — Ambulatory Visit: Payer: Self-pay | Admitting: *Deleted

## 2023-04-30 DIAGNOSIS — F430 Acute stress reaction: Secondary | ICD-10-CM

## 2023-04-30 DIAGNOSIS — F419 Anxiety disorder, unspecified: Secondary | ICD-10-CM

## 2023-04-30 NOTE — Telephone Encounter (Signed)
  Chief Complaint: Requesting Augmentin for her dental pain.   Having several cavities filled on Oct. 10, 2024 and is requesting the same antibiotic that Dr. Alvis Lemmings sent in before for her dental pain. Symptoms: Teeth with cavities are painful.  Having them filled in Oct. Frequency: N/A Pertinent Negatives: Patient denies N/A Disposition: [] ED /[] Urgent Care (no appt availability in office) / [] Appointment(In office/virtual)/ []  [place] [facility]/ [] Home Care/ [] Refused Recommended Disposition /[] [place] [facility]/ [x]  Follow-up with PCP Additional Notes: Refill requests have been submitted for the ibuprofen and hydroxyzine already.   Requesting all the medications be sent to  [facility] because she doesn't have transportation to go get them from a pharmacy.

## 2023-04-30 NOTE — Telephone Encounter (Signed)
Routing to PCP for review.

## 2023-04-30 NOTE — Telephone Encounter (Signed)
Message from Phill Myron sent at 04/30/2023  2:52 PM EDT  Summary: dental pain   Dental pain, would like to know if I can have this medication again  amoxicillin-clavulanate (AUGMENTIN) 875-125 MG table          Call History  Contact Date/Time Type Contact Phone/Fax User  04/30/2023 02:50 PM EDT Phone (Incoming) Scott- Merlinda Frederick, Khyrie Doleman (Self) [phone] [address], [place]   Reason for Disposition  Toothache present > 24 hours    Has several cavities being filled.  Requesting an antibiotic, the Augmentin be called in like she did before.  Answer Assessment - Initial Assessment Questions 1. LOCATION: "Which tooth is hurting?"  (e.g., right-side/left-side, upper/lower, front/back)     I have a toothache.   I have an appt scheduled on Oct 10 with my dentist.    I have more than one cavity and they are filling a few at the time.  They told me to do the orajel for the dental pain which I am doing.  My pharmacy said they need a refill from Dr. Alvis Lemmings for my ibuprofen.   I only have 2 pills left and that helps with the pain.   I also need her to call in an antibiotic the Augmentin like she did before.   I don't have any transportation so I need her to send it to the [facility] [employer] [facility].    I let her know her ibuprofen and hydroxyzine have been submitted just waiting on a response.   I will send the request for the Augmentin to Dr. Alvis Lemmings too.   She thanked me very much for my help.   2. ONSET: "When did the toothache start?"  (e.g., hours, days)      I have several teeth that have cavities that my dentist is filling. 3. SEVERITY: "How bad is the toothache?"  (Scale 1-10; mild, moderate or severe)   - MILD (1-3): doesn't interfere with chewing    - MODERATE (4-7): interferes with chewing, interferes with normal activities, awakens from sleep     - SEVERE (8-10): unable to eat, unable to do any normal activities, excruciating pain        Moderate pain but the  Orajel and ibuprofen do help. 4. SWELLING: "Is there any visible swelling of your face?"     Not asked 5. OTHER SYMPTOMS: "Do you have any other symptoms?" (e.g., fever)     Not asked 6. PREGNANCY: "Is there any chance you are pregnant?" "When was your last menstrual period?"     Not asked  Protocols used: Toothache-A-AH

## 2023-04-30 NOTE — Telephone Encounter (Signed)
Advised patient of an appt options VV and/or Mobile medicine.   Patient states she has a dental apt in October.  She does not have transportation to Mobile unit location today. States she will take OTC ibuprofen 200 mg to equal the 600 mg dose. Advised the frequency was every 12 hours. Patient verbalized understanding.   She also added that she needs a refill on Hydroxyzine.

## 2023-04-30 NOTE — Telephone Encounter (Signed)
Medication Refill - Medication: ibuprofen (ADVIL) 600 MG tablet       hydrOXYzine (ATARAX) 25 MG tablet  Has the patient contacted their pharmacy? Yes.     Preferred Pharmacy (with phone number or street name):  [facility] [address], [place] - [address]  [address] [place] [address]  Phone: [phone] Fax: [phone]     Has the patient been seen for an appointment in the last year OR does the patient have an upcoming appointment? Yes.    Agent: Please be advised that RX refills may take up to 3 business days. We ask that you follow-up with your pharmacy.

## 2023-05-01 MED ORDER — IBUPROFEN 600 MG PO TABS: 600.0000 mg | ORAL_TABLET | Freq: Two times a day (BID) | ORAL | 0 refills | Status: DC | PRN

## 2023-05-01 MED ORDER — HYDROXYZINE HCL 25 MG PO TABS: 25.0000 mg | ORAL_TABLET | Freq: Two times a day (BID) | ORAL | 6 refills | Status: DC | PRN

## 2023-05-01 MED ORDER — HYDROXYZINE HCL 25 MG PO TABS
25.0000 mg | ORAL_TABLET | Freq: Two times a day (BID) | ORAL | 6 refills | Status: DC | PRN
Start: 2023-05-01 — End: 2023-05-01

## 2023-05-01 NOTE — Telephone Encounter (Signed)
Requested Prescriptions  Pending Prescriptions Disp Refills   ibuprofen (ADVIL) 600 MG tablet 90 tablet 0    Sig: Take 1 tablet (600 mg total) by mouth every 12 (twelve) hours as needed.     Analgesics:  NSAIDS Failed - 04/30/2023  4:12 PM      Failed - Manual Review: Labs are only required if the patient has taken medication for more than 8 weeks.      Failed - Cr in normal range and within 360 days    Creat  Date Value Ref Range Status  01/16/2015 0.82 0.50 - 1.10 mg/dL Final   Creatinine, Ser  Date Value Ref Range Status  09/26/2022 1.10 (H) 0.44 - 1.00 mg/dL Final         Failed - PLT in normal range and within 360 days    Platelets  Date Value Ref Range Status  09/26/2022 134 (L) 150 - 400 K/uL Final  10/13/2017 167 150 - 379 x10E3/uL Final         Passed - HGB in normal range and within 360 days    Hemoglobin  Date Value Ref Range Status  09/26/2022 12.2 12.0 - 15.0 g/dL Final  33/29/5188 41.6 11.1 - 15.9 g/dL Final         Passed - HCT in normal range and within 360 days    HCT  Date Value Ref Range Status  09/26/2022 36.0 36.0 - 46.0 % Final   Hematocrit  Date Value Ref Range Status  10/13/2017 38.5 34.0 - 46.6 % Final         Passed - eGFR is 30 or above and within 360 days    GFR, Est African American  Date Value Ref Range Status  01/16/2015 >89 mL/min Final   GFR calc Af Amer  Date Value Ref Range Status  01/14/2020 74 >59 mL/min/1.73 Final    Comment:    **[facility] currently reports eGFR in compliance with the current**   recommendations of the [employer]. [facility] will   update reporting as new guidelines are published from the NKF-ASN   Task force.    GFR, Est Non African American  Date Value Ref Range Status  01/16/2015 85 mL/min Final    Comment:      The estimated GFR is a calculation valid for adults (>=30 years old) that uses the CKD-EPI algorithm to adjust for age and sex. It is   not to be used for children, pregnant  women, hospitalized patients,    patients on dialysis, or with rapidly changing kidney function. According to the NKDEP, eGFR >89 is normal, 60-89 shows mild impairment, 30-59 shows moderate impairment, 15-29 shows severe impairment and <15 is ESRD.      GFR, Estimated  Date Value Ref Range Status  09/26/2022 >60 >60 mL/min Final    Comment:    (NOTE) Calculated using the CKD-EPI Creatinine Equation (2021)    eGFR  Date Value Ref Range Status  07/18/2022 60 >59 mL/min/1.73 Final         Passed - Patient is not pregnant      Passed - Valid encounter within last 12 months    Recent Outpatient Visits           1 month ago Vertigo   [facility] [place], Odette Horns, MD   2 months ago Screening for diabetes mellitus   [place] [facility] Hoy Register, MD   6 months ago  Vertigo   [place] [facility] Hoy Register, MD   9 months ago Overactive bladder   [place] [facility] Hoy Register, MD   1 year ago Hypercholesterolemia   [facility] Hoy Register, MD       Future Appointments             In 1 month Hoy Register, MD [facility]

## 2023-05-01 NOTE — Addendum Note (Signed)
Addended by: Guy Franco on: 05/01/2023 04:16 PM   Modules accepted: Orders

## 2023-05-01 NOTE — Telephone Encounter (Signed)
Requested medication (s) are due for refill today: yes  Requested medication (s) are on the active medication list: yes  Last refill:  03/18/23 #30 0 refills  Future visit scheduled: yes in 1 month  Notes to clinic:  no refills remain. Do you want to refill Rx?     Requested Prescriptions  Pending Prescriptions Disp Refills   ibuprofen (ADVIL) 600 MG tablet 30 tablet 0    Sig: Take 1 tablet (600 mg total) by mouth every 12 (twelve) hours as needed.     Analgesics:  NSAIDS Failed - 04/30/2023  4:12 PM      Failed - Manual Review: Labs are only required if the patient has taken medication for more than 8 weeks.      Failed - Cr in normal range and within 360 days    Creat  Date Value Ref Range Status  01/16/2015 0.82 0.50 - 1.10 mg/dL Final   Creatinine, Ser  Date Value Ref Range Status  09/26/2022 1.10 (H) 0.44 - 1.00 mg/dL Final         Failed - PLT in normal range and within 360 days    Platelets  Date Value Ref Range Status  09/26/2022 134 (L) 150 - 400 K/uL Final  10/13/2017 167 150 - 379 x10E3/uL Final         Passed - HGB in normal range and within 360 days    Hemoglobin  Date Value Ref Range Status  09/26/2022 12.2 12.0 - 15.0 g/dL Final  40/98/1191 47.8 11.1 - 15.9 g/dL Final         Passed - HCT in normal range and within 360 days    HCT  Date Value Ref Range Status  09/26/2022 36.0 36.0 - 46.0 % Final   Hematocrit  Date Value Ref Range Status  10/13/2017 38.5 34.0 - 46.6 % Final         Passed - eGFR is 30 or above and within 360 days    GFR, Est African American  Date Value Ref Range Status  01/16/2015 >89 mL/min Final   GFR calc Af Amer  Date Value Ref Range Status  01/14/2020 74 >59 mL/min/1.73 Final    Comment:    **[facility] currently reports eGFR in compliance with the current**   recommendations of the [employer]. [facility] will   update reporting as new guidelines are published from the NKF-ASN   Task force.    GFR, Est  Non African American  Date Value Ref Range Status  01/16/2015 85 mL/min Final    Comment:      The estimated GFR is a calculation valid for adults (>=75 years old) that uses the CKD-EPI algorithm to adjust for age and sex. It is   not to be used for children, pregnant women, hospitalized patients,    patients on dialysis, or with rapidly changing kidney function. According to the NKDEP, eGFR >89 is normal, 60-89 shows mild impairment, 30-59 shows moderate impairment, 15-29 shows severe impairment and <15 is ESRD.      GFR, Estimated  Date Value Ref Range Status  09/26/2022 >60 >60 mL/min Final    Comment:    (NOTE) Calculated using the CKD-EPI Creatinine Equation (2021)    eGFR  Date Value Ref Range Status  07/18/2022 60 >59 mL/min/1.73 Final         Passed - Patient is not pregnant      Passed - Valid encounter within last 12 months    Recent  Outpatient Visits           1 month ago Vertigo   [place] [facility] Hoy Register, MD   2 months ago Screening for diabetes mellitus   [place] [facility] Hoy Register, MD   6 months ago Vertigo   [place] [facility] Hoy Register, MD   9 months ago Overactive bladder   [facility] Hoy Register, MD   1 year ago Hypercholesterolemia   [facility] Hoy Register, MD       Future Appointments             In 1 month Hoy Register, MD [facility]

## 2023-05-01 NOTE — Telephone Encounter (Signed)
Hydroxyzine has been refilled. Follow up with Dental for dental concerns as she received antibiotics just a month ago, agree with RN recommendations Thanks.

## 2023-05-01 NOTE — Addendum Note (Signed)
Addended by: Hoy Register on: 05/01/2023 08:38 AM   Modules accepted: Orders

## 2023-05-01 NOTE — Telephone Encounter (Signed)
Patient was called and notified the medication was sent to pharmacy.   She asked that medication be sent to [facility].   Called [facility] pharmacy to discontinue order.

## 2023-05-07 ENCOUNTER — Other Ambulatory Visit: Payer: Self-pay | Admitting: Family Medicine

## 2023-05-08 NOTE — Telephone Encounter (Signed)
Requested Prescriptions  Pending Prescriptions Disp Refills   ibuprofen (ADVIL) 600 MG tablet [Pharmacy Med Name: IBUPROFEN 600 MG TABS 600 Tablet] 60 tablet 10    Sig: TAKE 1 TABLET BY MOUTH EVERY 12 HOURS AS NEEDED     Analgesics:  NSAIDS Failed - 05/07/2023  7:05 PM      Failed - Manual Review: Labs are only required if the patient has taken medication for more than 8 weeks.      Failed - Cr in normal range and within 360 days    Creat  Date Value Ref Range Status  01/16/2015 0.82 0.50 - 1.10 mg/dL Final   Creatinine, Ser  Date Value Ref Range Status  09/26/2022 1.10 (H) 0.44 - 1.00 mg/dL Final         Failed - PLT in normal range and within 360 days    Platelets  Date Value Ref Range Status  09/26/2022 134 (L) 150 - 400 K/uL Final  10/13/2017 167 150 - 379 x10E3/uL Final         Passed - HGB in normal range and within 360 days    Hemoglobin  Date Value Ref Range Status  09/26/2022 12.2 12.0 - 15.0 g/dL Final  76/28/3151 76.1 11.1 - 15.9 g/dL Final         Passed - HCT in normal range and within 360 days    HCT  Date Value Ref Range Status  09/26/2022 36.0 36.0 - 46.0 % Final   Hematocrit  Date Value Ref Range Status  10/13/2017 38.5 34.0 - 46.6 % Final         Passed - eGFR is 30 or above and within 360 days    GFR, Est African American  Date Value Ref Range Status  01/16/2015 >89 mL/min Final   GFR calc Af Amer  Date Value Ref Range Status  01/14/2020 74 >59 mL/min/1.73 Final    Comment:    **[facility] currently reports eGFR in compliance with the current**   recommendations of the [employer]. [facility] will   update reporting as new guidelines are published from the NKF-ASN   Task force.    GFR, Est Non African American  Date Value Ref Range Status  01/16/2015 85 mL/min Final    Comment:      The estimated GFR is a calculation valid for adults (>=39 years old) that uses the CKD-EPI algorithm to adjust for age and sex. It is   not to be  used for children, pregnant women, hospitalized patients,    patients on dialysis, or with rapidly changing kidney function. According to the NKDEP, eGFR >89 is normal, 60-89 shows mild impairment, 30-59 shows moderate impairment, 15-29 shows severe impairment and <15 is ESRD.      GFR, Estimated  Date Value Ref Range Status  09/26/2022 >60 >60 mL/min Final    Comment:    (NOTE) Calculated using the CKD-EPI Creatinine Equation (2021)    eGFR  Date Value Ref Range Status  07/18/2022 60 >59 mL/min/1.73 Final         Passed - Patient is not pregnant      Passed - Valid encounter within last 12 months    Recent Outpatient Visits           1 month ago Vertigo   [place] [facility] [place], Odette Horns, MD   2 months ago Screening for diabetes mellitus   [facility] Hoy Register, MD  6 months ago Vertigo   [place] [facility] [place], [place], MD   9 months ago Overactive bladder   [place] [facility] [place], Odette Horns, MD   1 year ago Hypercholesterolemia   [place] [facility] Hoy Register, MD       Future Appointments             In 1 month Hoy Register, MD [facility]             solifenacin (VESICARE) 5 MG tablet [Pharmacy Med Name: SOLIFENACIN 5MG  TAB 5 Tablet] 30 tablet 10    Sig: TAKE 1 TABLET BY MOUTH DAILY     Urology:  Bladder Agents 2 Failed - 05/07/2023  7:05 PM      Failed - Cr in normal range and within 360 days    Creat  Date Value Ref Range Status  01/16/2015 0.82 0.50 - 1.10 mg/dL Final   Creatinine, Ser  Date Value Ref Range Status  09/26/2022 1.10 (H) 0.44 - 1.00 mg/dL Final         Passed - ALT in normal range and within 360 days    ALT  Date Value Ref Range Status  09/26/2022 30 0 - 44 U/L Final         Passed - AST in normal range and within 360 days     AST  Date Value Ref Range Status  09/26/2022 19 15 - 41 U/L Final         Passed - Valid encounter within last 12 months    Recent Outpatient Visits           1 month ago Vertigo   [place] [facility] Hoy Register, MD   2 months ago Screening for diabetes mellitus   [place] [facility] Hoy Register, MD   6 months ago Vertigo   [place] [facility] Hoy Register, MD   9 months ago Overactive bladder   [place] [facility] Hoy Register, MD   1 year ago Hypercholesterolemia   [place] [facility] Hoy Register, MD       Future Appointments             In 1 month Hoy Register, MD [facility]             meclizine (ANTIVERT) 12.5 MG tablet [Pharmacy Med Name: MECLIZINE 12.5MG  TAB*RX 12.5 Tablet] 60 tablet 10    Sig: TAKE 1 TABLET BY MOUTH THREE TIMES DAILY AS NEEDED FOR DIZZINESS     Not Delegated - Gastroenterology: Antiemetics Failed - 05/07/2023  7:05 PM      Failed - This refill cannot be delegated      Passed - Valid encounter within last 6 months    Recent Outpatient Visits           1 month ago Vertigo   [place] [facility] Hoy Register, MD   2 months ago Screening for diabetes mellitus   [place] [facility] Hoy Register, MD   6 months ago Vertigo   [place] [facility] [facility] Hoy Register, MD   9 months ago Overactive bladder    [facility] H[facility], MD   1 year ago  Hypercholesterolemia   [place] [facility] Hoy Register, MD       Future Appointments             In 1 month Hoy Register, MD [facility]

## 2023-05-08 NOTE — Telephone Encounter (Signed)
Requested medication (s) are due for refill today: Yes  Requested medication (s) are on the active medication list: Yes  Last refill:  02/12/23  Future visit scheduled: Yes  Notes to clinic:  Unable to refill per protocol, cannot delegate.      Requested Prescriptions  Pending Prescriptions Disp Refills   meclizine (ANTIVERT) 12.5 MG tablet [Pharmacy Med Name: MECLIZINE 12.5MG  TAB*RX 12.5 Tablet] 60 tablet 10    Sig: TAKE 1 TABLET BY MOUTH THREE TIMES DAILY AS NEEDED FOR DIZZINESS     Not Delegated - Gastroenterology: Antiemetics Failed - 05/07/2023  7:05 PM      Failed - This refill cannot be delegated      Passed - Valid encounter within last 6 months    Recent Outpatient Visits           1 month ago Vertigo   [place] [facility] [place], Odette Horns, MD   2 months ago Screening for diabetes mellitus   [place] [facility] [place], [place], MD   6 months ago Vertigo   [place] [facility] [place], Odette Horns, MD   9 months ago Overactive bladder   [place] [facility] [place], Odette Horns, MD   1 year ago Hypercholesterolemia   [place] [facility] [place], Odette Horns, MD       Future Appointments             In 1 month Alvis Lemmings, Odette Horns, MD [place] [facility]            Signed Prescriptions Disp Refills   solifenacin (VESICARE) 5 MG tablet 90 tablet 0    Sig: TAKE 1 TABLET BY MOUTH DAILY     Urology:  Bladder Agents 2 Failed - 05/07/2023  7:05 PM      Failed - Cr in normal range and within 360 days    Creat  Date Value Ref Range Status  01/16/2015 0.82 0.50 - 1.10 mg/dL Final   Creatinine, Ser  Date Value Ref Range Status  09/26/2022 1.10 (H) 0.44 - 1.00 mg/dL Final         Passed - ALT in normal range and within 360 days    ALT  Date Value Ref Range Status  09/26/2022 30 0 - 44 U/L Final         Passed - AST in  normal range and within 360 days    AST  Date Value Ref Range Status  09/26/2022 19 15 - 41 U/L Final         Passed - Valid encounter within last 12 months    Recent Outpatient Visits           1 month ago Vertigo   [place] [facility] Hoy Register, MD   2 months ago Screening for diabetes mellitus   [place] [facility] Hoy Register, MD   6 months ago Vertigo   [place] [facility] Hoy Register, MD   9 months ago Overactive bladder    [facility] Hoy Register, MD   1 year ago Hypercholesterolemia   [facility] Hoy Register, MD       Future Appointments             In 1 month Hoy Register, MD [facility]            Refused Prescriptions Disp Refills   ibuprofen (ADVIL) 600 MG tablet [Pharmacy Med Name: IBUPROFEN 600 MG TABS 600 Tablet] 60 tablet 10    Sig: TAKE 1 TABLET BY MOUTH EVERY 12 HOURS AS NEEDED     Analgesics:  NSAIDS Failed - 05/07/2023  7:05 PM      Failed - Manual Review: Labs are only required if the patient has taken medication for more than 8 weeks.      Failed - Cr in normal range and within 360 days    Creat  Date Value Ref Range Status  01/16/2015 0.82 0.50 - 1.10 mg/dL Final   Creatinine, Ser  Date Value Ref Range Status  09/26/2022 1.10 (H) 0.44 - 1.00 mg/dL Final         Failed - PLT in normal range and within 360 days    Platelets  Date Value Ref Range Status  09/26/2022 134 (L) 150 - 400 K/uL Final  10/13/2017 167 150 - 379 x10E3/uL Final         Passed - HGB in normal range and within 360 days    Hemoglobin  Date Value Ref Range Status  09/26/2022 12.2 12.0 - 15.0 g/dL Final  96/11/5407 81.1 11.1 - 15.9 g/dL Final         Passed - HCT in normal range and within 360 days    HCT  Date Value Ref Range Status  09/26/2022 36.0 36.0 -  46.0 % Final   Hematocrit  Date Value Ref Range Status  10/13/2017 38.5 34.0 - 46.6 % Final         Passed - eGFR is 30 or above and within 360 days    GFR, Est African American  Date Value Ref Range Status  01/16/2015 >89 mL/min Final   GFR calc Af Amer  Date Value Ref Range Status  01/14/2020 74 >59 mL/min/1.73 Final    Comment:    **[facility] currently reports eGFR in compliance with the current**   recommendations of the [employer]. [facility] will   update reporting as new guidelines are published from the NKF-ASN   Task force.    GFR, Est Non African American  Date Value Ref Range Status  01/16/2015 85 mL/min Final    Comment:      The estimated GFR is a calculation valid for adults (>=82 years old) that uses the CKD-EPI algorithm to adjust for age and sex. It is   not to be used for children, pregnant women, hospitalized patients,    patients on dialysis, or with rapidly changing kidney function. According to the NKDEP, eGFR >89 is normal, 60-89 shows mild impairment, 30-59 shows moderate impairment, 15-29 shows severe impairment and <15 is ESRD.      GFR, Estimated  Date Value Ref Range Status  09/26/2022 >60 >60 mL/min Final    Comment:    (NOTE) Calculated using the CKD-EPI Creatinine Equation (2021)    eGFR  Date Value Ref Range Status  07/18/2022 60 >59 mL/min/1.73 Final         Passed - Patient is not pregnant      Passed - Valid encounter within last 12 months    Recent Outpatient Visits           1 month ago Vertigo   [place] [facility] [place], Odette Horns, MD   2 months ago Screening for diabetes mellitus   [facility] [place], [place], MD   6 months ago Vertigo   [place] [facility] Hoy Register, MD   9 months ago Overactive bladder   [place] [facility] Hoy [facility], MD   1 year ago Hypercholesterolemia    [facility] Hoy Register, MD       Future Appointments             In 1 month Hoy Register, MD [facility]

## 2023-05-08 NOTE — Telephone Encounter (Signed)
Request too soon. Last refilled 05/01/23 Requested Prescriptions  Pending Prescriptions Disp Refills   meclizine (ANTIVERT) 12.5 MG tablet [Pharmacy Med Name: MECLIZINE 12.5MG  TAB*RX 12.5 Tablet] 60 tablet 10    Sig: TAKE 1 TABLET BY MOUTH THREE TIMES DAILY AS NEEDED FOR DIZZINESS     Not Delegated - Gastroenterology: Antiemetics Failed - 05/07/2023  7:05 PM      Failed - This refill cannot be delegated      Passed - Valid encounter within last 6 months    Recent Outpatient Visits           1 month ago Vertigo   [place] [facility] [place], Odette Horns, MD   2 months ago Screening for diabetes mellitus   [facility] [place], [place], MD   6 months ago Vertigo   [place] [facility] [place], Odette Horns, MD   9 months ago Overactive bladder   [place] [facility] [place], Odette Horns, MD   1 year ago Hypercholesterolemia   [place] [facility] [place], Odette Horns, MD       Future Appointments             In 1 month Alvis Lemmings, Odette Horns, MD [place] [facility]            Signed Prescriptions Disp Refills   solifenacin (VESICARE) 5 MG tablet 90 tablet 0    Sig: TAKE 1 TABLET BY MOUTH DAILY     Urology:  Bladder Agents 2 Failed - 05/07/2023  7:05 PM      Failed - Cr in normal range and within 360 days    Creat  Date Value Ref Range Status  01/16/2015 0.82 0.50 - 1.10 mg/dL Final   Creatinine, Ser  Date Value Ref Range Status  09/26/2022 1.10 (H) 0.44 - 1.00 mg/dL Final         Passed - ALT in normal range and within 360 days    ALT  Date Value Ref Range Status  09/26/2022 30 0 - 44 U/L Final         Passed - AST in normal range and within 360 days    AST  Date Value Ref Range Status  09/26/2022 19 15 - 41 U/L Final         Passed - Valid encounter within last 12 months    Recent Outpatient Visits           1 month  ago Vertigo   [place] [facility] Hoy Register, MD   2 months ago Screening for diabetes mellitus   [place] [facility] Hoy Register, MD   6 months ago Vertigo   [place] [facility] Hoy Register, MD   9 months ago Overactive bladder   [place] [facility] Center Hoy Register, MD   1 year ago Hypercholesterolemia    [facility] Hoy Register, MD       Future Appointments             In 1 month Hoy Register, MD [facility]            Refused Prescriptions Disp Refills   ibuprofen (ADVIL) 600 MG tablet [Pharmacy Med Name: IBUPROFEN 600 MG TABS 600 Tablet] 60 tablet 10    Sig: TAKE 1  TABLET BY MOUTH EVERY 12 HOURS AS NEEDED     Analgesics:  NSAIDS Failed - 05/07/2023  7:05 PM      Failed - Manual Review: Labs are only required if the patient has taken medication for more than 8 weeks.      Failed - Cr in normal range and within 360 days    Creat  Date Value Ref Range Status  01/16/2015 0.82 0.50 - 1.10 mg/dL Final   Creatinine, Ser  Date Value Ref Range Status  09/26/2022 1.10 (H) 0.44 - 1.00 mg/dL Final         Failed - PLT in normal range and within 360 days    Platelets  Date Value Ref Range Status  09/26/2022 134 (L) 150 - 400 K/uL Final  10/13/2017 167 150 - 379 x10E3/uL Final         Passed - HGB in normal range and within 360 days    Hemoglobin  Date Value Ref Range Status  09/26/2022 12.2 12.0 - 15.0 g/dL Final  09/81/1914 78.2 11.1 - 15.9 g/dL Final         Passed - HCT in normal range and within 360 days    HCT  Date Value Ref Range Status  09/26/2022 36.0 36.0 - 46.0 % Final   Hematocrit  Date Value Ref Range Status  10/13/2017 38.5 34.0 - 46.6 % Final         Passed - eGFR is 30 or above and within 360 days    GFR, Est African American  Date Value Ref Range  Status  01/16/2015 >89 mL/min Final   GFR calc Af Amer  Date Value Ref Range Status  01/14/2020 74 >59 mL/min/1.73 Final    Comment:    **[facility] currently reports eGFR in compliance with the current**   recommendations of the [employer]. [facility] will   update reporting as new guidelines are published from the NKF-ASN   Task force.    GFR, Est Non African American  Date Value Ref Range Status  01/16/2015 85 mL/min Final    Comment:      The estimated GFR is a calculation valid for adults (>=50 years old) that uses the CKD-EPI algorithm to adjust for age and sex. It is   not to be used for children, pregnant women, hospitalized patients,    patients on dialysis, or with rapidly changing kidney function. According to the NKDEP, eGFR >89 is normal, 60-89 shows mild impairment, 30-59 shows moderate impairment, 15-29 shows severe impairment and <15 is ESRD.      GFR, Estimated  Date Value Ref Range Status  09/26/2022 >60 >60 mL/min Final    Comment:    (NOTE) Calculated using the CKD-EPI Creatinine Equation (2021)    eGFR  Date Value Ref Range Status  07/18/2022 60 >59 mL/min/1.73 Final         Passed - Patient is not pregnant      Passed - Valid encounter within last 12 months    Recent Outpatient Visits           1 month ago Vertigo   [place] [facility] Center [place], Odette Horns, MD   2 months ago Screening for diabetes mellitus   [place] [facility] Hoy Register, MD   6 months ago Vertigo   [place] [facility] Hoy Register, MD   9 months ago Overactive bladder   [facility]  [facility] [place], Odette Horns, MD   1 year ago Hypercholesterolemia   [place] [facility] Hoy Register, MD       Future Appointments             In 1 month Hoy Register, MD [facility]

## 2023-05-13 ENCOUNTER — Other Ambulatory Visit: Payer: Self-pay | Admitting: Family Medicine

## 2023-06-05 ENCOUNTER — Other Ambulatory Visit: Payer: Self-pay | Admitting: Family Medicine

## 2023-06-06 ENCOUNTER — Telehealth (INDEPENDENT_AMBULATORY_CARE_PROVIDER_SITE_OTHER): Payer: Self-pay

## 2023-06-06 NOTE — Telephone Encounter (Signed)
Called pt to make her aware that she could get the second vaccine. Got her apt changed as she requested and pt also stated that she did not want Covid vaccine flue vaccine and shingles vaccine in the same day. Pt wanted to shift the other shots around to get the one she really needs at this time.

## 2023-06-06 NOTE — Telephone Encounter (Signed)
Copied from CRM [id number]. Topic: General - Inquiry >> Jun 04, 2023  1:40 PM Yolanda T wrote: Reason for CRM: patient needs a call back to let her know when she had her 1st Shingles shot and when she would need to return for the second one

## 2023-06-06 NOTE — Telephone Encounter (Signed)
Pt had first shingles vaccine on 01/15/2022. Pt is due for last shingles vaccine. Patient has an appt on 06/10/2023. Please make pt aware that she can get second vaccine at appointment

## 2023-06-06 NOTE — Telephone Encounter (Signed)
Requested medication (s) are due for refill today:   Yes for Ibuprofen,  Provider to review Antivert  Requested medication (s) are on the active medication list:   Yes for both  Future visit scheduled:   Yes 11/5/ with Dr Alvis Lemmings   Last ordered: Ibuprofen 05/14/2023 #60, 0 refills;   Antivert 05/08/2023 #60, 0 refills  Returned because of non delegated refill.     Requested Prescriptions  Pending Prescriptions Disp Refills   ibuprofen (ADVIL) 600 MG tablet [Pharmacy Med Name: IBUPROFEN 600 MG TABS 600 Tablet] 60 tablet 10    Sig: TAKE 1 TABLET BY MOUTH EVERY 12 HOURS AS NEEDED     Analgesics:  NSAIDS Failed - 06/05/2023  6:57 PM      Failed - Manual Review: Labs are only required if the patient has taken medication for more than 8 weeks.      Failed - Cr in normal range and within 360 days    Creat  Date Value Ref Range Status  01/16/2015 0.82 0.50 - 1.10 mg/dL Final   Creatinine, Ser  Date Value Ref Range Status  09/26/2022 1.10 (H) 0.44 - 1.00 mg/dL Final         Failed - PLT in normal range and within 360 days    Platelets  Date Value Ref Range Status  09/26/2022 134 (L) 150 - 400 K/uL Final  10/13/2017 167 150 - 379 x10E3/uL Final         Passed - HGB in normal range and within 360 days    Hemoglobin  Date Value Ref Range Status  09/26/2022 12.2 12.0 - 15.0 g/dL Final  95/62/1308 65.7 11.1 - 15.9 g/dL Final         Passed - HCT in normal range and within 360 days    HCT  Date Value Ref Range Status  09/26/2022 36.0 36.0 - 46.0 % Final   Hematocrit  Date Value Ref Range Status  10/13/2017 38.5 34.0 - 46.6 % Final         Passed - eGFR is 30 or above and within 360 days    GFR, Est African American  Date Value Ref Range Status  01/16/2015 >89 mL/min Final   GFR calc Af Amer  Date Value Ref Range Status  01/14/2020 74 >59 mL/min/1.73 Final    Comment:    **[facility] currently reports eGFR in compliance with the current**   recommendations of the [employer]. [facility] will   update reporting as new guidelines are published from the NKF-ASN   Task force.    GFR, Est Non African American  Date Value Ref Range Status  01/16/2015 85 mL/min Final    Comment:      The estimated GFR is a calculation valid for adults (>=5 years old) that uses the CKD-EPI algorithm to adjust for age and sex. It is   not to be used for children, pregnant women, hospitalized patients,    patients on dialysis, or with rapidly changing kidney function. According to the NKDEP, eGFR >89 is normal, 60-89 shows mild impairment, 30-59 shows moderate impairment, 15-29 shows severe impairment and <15 is ESRD.      GFR, Estimated  Date Value Ref Range Status  09/26/2022 >60 >60 mL/min Final    Comment:    (NOTE) Calculated using the CKD-EPI Creatinine Equation (2021)    eGFR  Date Value Ref Range Status  07/18/2022 60 >59 mL/min/1.73 Final         Passed -  Patient is not pregnant      Passed - Valid encounter within last 12 months    Recent Outpatient Visits           2 months ago Vertigo   [place] [facility] [place], Odette Horns, MD   3 months ago Screening for diabetes mellitus   [place] [facility] Hoy Register, MD   7 months ago Vertigo   [place] [facility] Hoy Register, MD   10 months ago Overactive bladder   [place] [facility] Hoy Register, MD   1 year ago Hypercholesterolemia   [place] [facility] Hoy Register, MD       Future Appointments             In 4 days Hoy Register, MD [facility]             meclizine (ANTIVERT) 12.5 MG tablet [Pharmacy Med Name: MECLIZINE 12.5MG  TAB*RX 12.5 Tablet] 60 tablet 10    Sig: TAKE 1 TABLET BY MOUTH THREE TIMES DAILY AS NEEDED FOR DIZZINESS     Not Delegated - Gastroenterology: Antiemetics Failed -  06/05/2023  6:57 PM      Failed - This refill cannot be delegated      Passed - Valid encounter within last 6 months    Recent Outpatient Visits           2 months ago Vertigo   [place] [facility] Hoy Register, MD   3 months ago Screening for diabetes mellitus   [place] [facility] Hoy Register, MD   7 months ago Vertigo   [place] [facility] Hoy Register, MD   10 months ago Overactive bladder   [place] [facility] Hoy Register, MD   1 year ago Hypercholesterolemia   [facility] Hoy Register, MD       Future Appointments             In 4 days Hoy Register, MD [facility]

## 2023-06-10 ENCOUNTER — Ambulatory Visit: Payer: Medicaid Other | Admitting: Family Medicine

## 2023-06-23 ENCOUNTER — Ambulatory Visit: Payer: Self-pay | Admitting: *Deleted

## 2023-06-23 NOTE — Telephone Encounter (Signed)
  Chief Complaint: Right foot is slightly swollen and having intermittent numbness.   Possible sciatic flare up Symptoms: Was having back pain but that has stopped but having pain in upper right leg, knee and down into foot.   Frequency: On and off    Takes gabapentin for it. Pertinent Negatives: Patient denies not being able to walk.    Disposition: [] ED /[] Urgent Care (no appt availability in office) / [x] Appointment(In office/virtual)/ []  [facility]/ [] Home Care/ [] Refused Recommended Disposition /[] [facility]/ []  Follow-up with PCP Additional Notes: Pt has an appt with Dr. Alvis Lemmings already scheduled for this [facility].   No appts earlier than that.

## 2023-06-23 NOTE — Telephone Encounter (Signed)
Reason for Disposition  Numbness (i.e., loss of sensation) in foot or toes  (Exception: Just tingling; numbness present > 2 weeks.)  Answer Assessment - Initial Assessment Questions 1. ONSET: "When did the pain start?"      Right foot is having numbness and a little swelling.     Pain in upper right leg and knee.   I've had injections in my knee for arthritis pain.   I've gained weight from the steroids injections.   I have been diagnosed with sciatic pain and it goes down my right leg and into my foot.   It might be that flaring up.  2. LOCATION: "Where is the pain located?"      Right foot is slightly swollen and has numbness intermittently.   I can walk.   I'm using an Ace bandage on up to my knee to help the swelling.  I encouraged her to elevate her leg instead for the swelling. 3. PAIN: "How bad is the pain?"    (Scale 1-10; or mild, moderate, severe)  - MILD (1-3): doesn't interfere with normal activities.   - MODERATE (4-7): interferes with normal activities (e.g., work or school) or awakens from sleep, limping.   - SEVERE (8-10): excruciating pain, unable to do any normal activities, unable to walk.      The numbness comes and goes.   I have sciatica pain.   I was having back pain and it goes down into my leg and foot.   I'm taking  gabapentin for it.    4. WORK OR EXERCISE: "Has there been any recent work or exercise that involved this part of the body?"      No 5. CAUSE: "What do you think is causing the foot pain?"     Maybe my sciatic pain  6. OTHER SYMPTOMS: "Do you have any other symptoms?" (e.g., leg pain, rash, fever, numbness)     No see above 7. PREGNANCY: "Is there any chance you are pregnant?" "When was your last menstrual period?"     Not asked due to age  Protocols used: Foot Pain-A-AH

## 2023-06-26 ENCOUNTER — Ambulatory Visit: Payer: Medicaid Other | Attending: Family Medicine | Admitting: Family Medicine

## 2023-06-26 ENCOUNTER — Encounter: Payer: Self-pay | Admitting: Family Medicine

## 2023-06-26 VITALS — BP 131/87 | HR 72 | Ht 68.0 in | Wt 247.0 lb

## 2023-06-26 DIAGNOSIS — Z1211 Encounter for screening for malignant neoplasm of colon: Secondary | ICD-10-CM

## 2023-06-26 DIAGNOSIS — I10 Essential (primary) hypertension: Secondary | ICD-10-CM | POA: Diagnosis not present

## 2023-06-26 DIAGNOSIS — E669 Obesity, unspecified: Secondary | ICD-10-CM

## 2023-06-26 DIAGNOSIS — M5441 Lumbago with sciatica, right side: Secondary | ICD-10-CM | POA: Diagnosis not present

## 2023-06-26 DIAGNOSIS — E7800 Pure hypercholesterolemia, unspecified: Secondary | ICD-10-CM

## 2023-06-26 DIAGNOSIS — M25561 Pain in right knee: Secondary | ICD-10-CM | POA: Diagnosis not present

## 2023-06-26 DIAGNOSIS — M5442 Lumbago with sciatica, left side: Secondary | ICD-10-CM

## 2023-06-26 DIAGNOSIS — G8929 Other chronic pain: Secondary | ICD-10-CM

## 2023-06-26 DIAGNOSIS — E038 Other specified hypothyroidism: Secondary | ICD-10-CM

## 2023-06-26 DIAGNOSIS — Z23 Encounter for immunization: Secondary | ICD-10-CM

## 2023-06-26 MED ORDER — AMLODIPINE BESYLATE 10 MG PO TABS: ORAL_TABLET | ORAL | 1 refills | Status: DC

## 2023-06-26 MED ORDER — DULOXETINE HCL 60 MG PO CPEP: 60.0000 mg | ORAL_CAPSULE | Freq: Every day | ORAL | 1 refills | Status: DC

## 2023-06-26 MED ORDER — TIZANIDINE HCL 4 MG PO TABS: 4.0000 mg | ORAL_TABLET | Freq: Two times a day (BID) | ORAL | 1 refills | Status: DC

## 2023-06-26 MED ORDER — LIDOCAINE 5 % EX PTCH: 1.0000 | MEDICATED_PATCH | CUTANEOUS | 0 refills | Status: DC

## 2023-06-26 MED ORDER — ATORVASTATIN CALCIUM 20 MG PO TABS: 20.0000 mg | ORAL_TABLET | Freq: Every day | ORAL | 1 refills | Status: DC

## 2023-06-26 MED ORDER — METOPROLOL TARTRATE 50 MG PO TABS: 50.0000 mg | ORAL_TABLET | Freq: Two times a day (BID) | ORAL | 10 refills | Status: DC

## 2023-06-26 MED ORDER — GABAPENTIN 300 MG PO CAPS: 600.0000 mg | ORAL_CAPSULE | Freq: Every day | ORAL | 10 refills | Status: DC

## 2023-06-26 NOTE — Progress Notes (Signed)
Subjective:  Patient ID: Heidi Barrett, female    DOB: Feb 04, 1966  Age: 57 y.o. MRN: [id number]  CC: Weight Gain (Right foot swelling/Pain in right leg/Lower back pain/)   HPI Heidi Barrett is a 57 y.o. year old female with a history of hypertension, menorrhagia secondary to fibroids, hypothyroidism, degenerative disease of the lumbar spine, low back pain with sciatica, chronic right knee pain, vertigo.   Interval History: Discussed the use of AI scribe software for clinical note transcription with the patient, who gave verbal consent to proceed.  She presents with ongoing discomfort. The pain radiates from the lower back down the right leg, making it difficult for her to exercise. She reports that the pain is located in the right knee joint and on the right side and lower back, where she also experiences swelling. The pain has been so severe that she has had to use a rollator to walk to her mailbox. She has previously received two steroid injections in both knees, which she believes may have contributed to her recent weight gain.  In addition to her chronic pain, the patient has noticed significant weight gain over the past several months. She has gained approximately 16 pounds since March, and 9 pounds in the last four months. She has had increased sedentary behavior due to her pain, recent steroid injections, and the onset of menopause. She has not had a menstrual period in over a year, which she believes indicates she has entered menopause. Despite efforts to manage her weight through dietary changes, including eating salads and reducing meal frequency, she has continued to gain weight.  The patient also reports occasional chest pain, which she believes may be due to gas. She has not sought medical attention for this symptom.  Endorses adherence of her levothyroxine.  She is doing well on her antihypertensive and her statin.       Past Medical History:  Diagnosis  Date   Anemia    Hypertension    Thyroid disease     Past Surgical History:  Procedure Laterality Date   IR GENERIC HISTORICAL  05/28/2016   IR RADIOLOGIST EVAL & MGMT 05/28/2016 Richarda Overlie, MD [facility] INTERV RAD   IR RADIOLOGIST EVAL & MGMT  03/06/2017   IR RADIOLOGIST EVAL & MGMT  02/17/2018    Family History  Problem Relation Age of Onset   Heart disease Mother    Stroke Mother    Cancer Father        prostate    Social History   Socioeconomic History   Marital status: Single    Spouse name: Not on file   Number of children: Not on file   Years of education: Not on file   Highest education level: Not on file  Occupational History   Not on file  Tobacco Use   Smoking status: Never   Smokeless tobacco: Never  Vaping Use   Vaping status: Never Used  Substance and Sexual Activity   Alcohol use: No   Drug use: No   Sexual activity: Never    Birth control/protection: None  Other Topics Concern   Not on file  Social History Narrative   Not on file   Social Determinants of Health   Financial Resource Strain: High Risk (06/26/2023)   Overall Financial Resource Strain (CARDIA)    Difficulty of Paying Living Expenses: Very hard  Food Insecurity: Food Insecurity Present (06/26/2023)   Hunger Vital Sign    Worried About Running  Out of Food in the Last Year: Often true    Ran Out of Food in the Last Year: Often true  Transportation Needs: Unmet Transportation Needs (06/26/2023)   PRAPARE - Administrator, Civil Service (Medical): Yes    Lack of Transportation (Non-Medical): Yes  Physical Activity: Inactive (06/26/2023)   Exercise Vital Sign    Days of Exercise per Week: 0 days    Minutes of Exercise per Session: 0 min  Stress: Stress Concern Present (06/26/2023)   [employer] of Occupational Health - Occupational Stress Questionnaire    Feeling of Stress : To some extent  Social Connections: Moderately Isolated (06/26/2023)   Social Connection and  Isolation Panel [NHANES]    Frequency of Communication with Friends and Family: Once a week    Frequency of Social Gatherings with Friends and Family: Never    Attends Religious Services: 1 to 4 times per year    Active Member of [employer] or Organizations: Yes    Attends Banker Meetings: Never    Marital Status: Divorced    Allergies  Allergen Reactions   Hctz [Hydrochlorothiazide] Shortness Of Breath and Other (See Comments)    wheezing   Robaxin [Methocarbamol] Swelling    Sensation of throat swelling/difficulty swallowing after taking this medication   Tramadol Anaphylaxis and Swelling   Hydrocodone Nausea And Vomiting    dizzines dizzines   Oxycodone-Acetaminophen Nausea And Vomiting    weakness   Percocet [Oxycodone-Acetaminophen] Nausea And Vomiting and Other (See Comments)    weakness    Outpatient Medications Prior to Visit  Medication Sig Dispense Refill   albuterol (PROVENTIL HFA) 108 (90 Base) MCG/ACT inhaler Inhale 2 puffs into the lungs every 4 (four) hours as needed for wheezing or shortness of breath. 6.7 g 2   amoxicillin-clavulanate (AUGMENTIN) 875-125 MG tablet Take 1 tablet by mouth 2 (two) times daily. 20 tablet 0   calcium carbonate (CALCIUM 600) 600 MG TABS tablet Take 1 tablet (600 mg total) by mouth daily with breakfast. 60 tablet 3   EPINEPHrine 0.3 mg/0.3 mL IJ SOAJ injection Inject 0.3 mg into the muscle as needed for anaphylaxis. 1 each prn   hydrOXYzine (ATARAX) 25 MG tablet Take 1 tablet (25 mg total) by mouth 2 (two) times daily as needed. 60 tablet 6   ibuprofen (ADVIL) 600 MG tablet TAKE 1 TABLET BY MOUTH EVERY 12 HOURS AS NEEDED 60 tablet 2   ibuprofen (ADVIL) 800 MG tablet Take 1 tablet (800 mg total) by mouth 3 (three) times daily. 21 tablet 0   levothyroxine (SYNTHROID) 125 MCG tablet Take 1 tablet (125 mcg total) by mouth daily before breakfast. 90 tablet 1   meclizine (ANTIVERT) 12.5 MG tablet TAKE 1 TABLET BY MOUTH THREE TIMES  DAILY AS NEEDED FOR DIZZINESS 60 tablet 2   Misc. Devices MISC 4 pronged cane. Diagnosis right knee degenerative changes 1 each 0   Misc. Devices MISC Rolling walker with seat. Diagnosis Right knee degenerative changes 1 each 0   Misc. Devices MISC Depends, wipes, bed pads. Diagnosis : urinary incontinence. 1 each 0   Misc. Devices MISC Shower chair.  Diagnosis low back pain 1 each 0   Multiple Vitamin (MULTIVITAMIN WITH MINERALS) TABS tablet Take 1 tablet by mouth daily. 90 tablet 3   solifenacin (VESICARE) 5 MG tablet TAKE 1 TABLET BY MOUTH DAILY 90 tablet 0   amLODipine (NORVASC) 10 MG tablet TAKE 1 TABLET BY MOUTH DAILY FOR BLOOD PRESSURE 90 tablet  1   atorvastatin (LIPITOR) 20 MG tablet Take 1 tablet (20 mg total) by mouth daily. 90 tablet 1   DULoxetine (CYMBALTA) 60 MG capsule Take 1 capsule (60 mg total) by mouth daily. 90 capsule 1   gabapentin (NEURONTIN) 300 MG capsule TAKE 2 CAPSULES BY MOUTH AT BEDTIME 180 capsule 10   metoprolol tartrate (LOPRESSOR) 50 MG tablet TAKE 1 TABLET BY MOUTH TWICE DAILY 60 tablet 10   tiZANidine (ZANAFLEX) 4 MG tablet Take 1 tablet (4 mg total) by mouth 2 (two) times daily. 180 tablet 1   fluticasone (FLONASE) 50 MCG/ACT nasal spray SHAKE LIQUID AND USE 2 SPRAYS IN EACH NOSTRIL DAILY (Patient not taking: Reported on 06/26/2023) 16 g 1   No facility-administered medications prior to visit.     ROS Review of Systems  Constitutional:  Positive for unexpected weight change. Negative for activity change and appetite change.  HENT:  Negative for sinus pressure and sore throat.   Respiratory:  Negative for chest tightness, shortness of breath and wheezing.   Cardiovascular:  Negative for chest pain and palpitations.  Gastrointestinal:  Negative for abdominal distention, abdominal pain and constipation.  Genitourinary: Negative.   Musculoskeletal:        See HPI  Psychiatric/Behavioral:  Negative for behavioral problems and dysphoric mood.      Objective:  BP 131/87   Pulse 72   Ht 5\' 8"  (1.727 m)   Wt 247 lb (112 kg)   SpO2 99%   BMI 37.56 kg/m      06/26/2023    9:27 AM 02/12/2023    3:31 PM 10/30/2022    3:00 PM  BP/Weight  Systolic BP 131 124 131  Diastolic BP 87 84 71  Wt. (Lbs) 247 238   BMI 37.56 kg/m2 36.19 kg/m2     Wt Readings from Last 3 Encounters:  06/26/23 247 lb (112 kg)  02/12/23 238 lb (108 kg)  10/08/22 231 lb 1.6 oz (104.8 kg)     Physical Exam Constitutional:      Appearance: She is well-developed.  Cardiovascular:     Rate and Rhythm: Normal rate.     Heart sounds: Normal heart sounds. No murmur heard. Pulmonary:     Effort: Pulmonary effort is normal.     Breath sounds: Normal breath sounds. No wheezing or rales.  Chest:     Chest wall: No tenderness.  Abdominal:     General: Bowel sounds are normal. There is no distension.     Palpations: Abdomen is soft. There is no mass.     Tenderness: There is no abdominal tenderness.  Musculoskeletal:     Thoracic back: Tenderness present.     Lumbar back: Tenderness present.     Right knee: Swelling present. Decreased range of motion. Tenderness present.     Left knee: No swelling. Decreased range of motion. No tenderness.     Right lower leg: No edema.     Left lower leg: No edema.  Neurological:     Mental Status: She is alert and oriented to person, place, and time.  Psychiatric:        Mood and Affect: Mood normal.        Latest Ref Rng & Units 09/26/2022    1:38 PM 09/26/2022   12:56 PM 07/18/2022   11:06 AM  CMP  Glucose 70 - 99 mg/dL 161  096  82   BUN 6 - 20 mg/dL 11  10  11    Creatinine 0.44 -  1.00 mg/dL 1.61  0.96  0.45   Sodium 135 - 145 mmol/L 136  133  142   Potassium 3.5 - 5.1 mmol/L 4.2  4.1  4.1   Chloride 98 - 111 mmol/L 101  101  101   CO2 22 - 32 mmol/L  26  27   Calcium 8.9 - 10.3 mg/dL  8.6  9.8   Total Protein 6.5 - 8.1 g/dL  6.2  7.1   Total Bilirubin 0.3 - 1.2 mg/dL  0.7  0.5   Alkaline Phos 38 -  126 U/L  46  74   AST 15 - 41 U/L  19  18   ALT 0 - 44 U/L  30  40     Lipid Panel     Component Value Date/Time   CHOL 150 02/25/2022 1038   TRIG 99 02/25/2022 1038   HDL 57 02/25/2022 1038   CHOLHDL 2.4 10/26/2020 0957   CHOLHDL 2.6 01/16/2015 1041   VLDL 9 01/16/2015 1041   LDLCALC 75 02/25/2022 1038    CBC    Component Value Date/Time   WBC 4.9 09/26/2022 1256   RBC 4.16 09/26/2022 1256   HGB 12.2 09/26/2022 1338   HGB 13.3 10/13/2017 1346   HCT 36.0 09/26/2022 1338   HCT 38.5 10/13/2017 1346   PLT 134 (L) 09/26/2022 1256   PLT 167 10/13/2017 1346   MCV 88.2 09/26/2022 1256   MCV 90 10/13/2017 1346   MCH 30.8 09/26/2022 1256   MCHC 34.9 09/26/2022 1256   RDW 12.6 09/26/2022 1256   RDW 13.6 10/13/2017 1346   LYMPHSABS 1.3 11/13/2021 1550   LYMPHSABS 1.1 05/07/2017 1120   MONOABS 0.4 11/13/2021 1550   EOSABS 0.3 11/13/2021 1550   EOSABS 0.2 05/07/2017 1120   BASOSABS 0.0 11/13/2021 1550   BASOSABS 0.0 05/07/2017 1120    Lab Results  Component Value Date   HGBA1C 4.9 02/12/2023   Lab Results  Component Value Date   TSH 1.340 07/18/2022    Assessment & Plan:      Lower Back Pain and Right Knee Pain Chronic pain, exacerbated by recent weight gain and sedentary lifestyle. Pain is limiting ability to exercise. Recent steroid injections in knees may have contributed to weight gain. -Refer for water therapy to facilitate low-impact exercise. -Recommend over-the-counter Lidoderm patches for back pain. -Advise patient to contact orthopedic provider regarding persistent knee swelling.  Weight Gain Significant weight gain since March, possibly related to menopause, sedentary lifestyle, and recent steroid injections. Patient has made dietary changes but has not seen improvement. -Order thyroid function tests to rule out hypothyroidism as a cause of weight gain. -Refer to weight management clinic for further support and  guidance.  Hypertension Controlled -Continue current regimen -Counseled on blood pressure goal of less than 130/80, low-sodium, DASH diet, medication compliance, 150 minutes of moderate intensity exercise per week. Discussed medication compliance, adverse effects.  Hypothyroidism -Controlled -Will check thyroid labs and adjust levothyroxine dose accordingly  Hyperlipidemia -Controlled -Continue statin -Lipid panel today  General Health Maintenance -Order Cologuard test for colon cancer screening. -Administer flu shot and second dose of shingles vaccine today.          Meds ordered this encounter  Medications   amLODipine (NORVASC) 10 MG tablet    Sig: TAKE 1 TABLET BY MOUTH DAILY FOR BLOOD PRESSURE    Dispense:  90 tablet    Refill:  1   atorvastatin (LIPITOR) 20 MG tablet    Sig: Take  1 tablet (20 mg total) by mouth daily.    Dispense:  90 tablet    Refill:  1   DULoxetine (CYMBALTA) 60 MG capsule    Sig: Take 1 capsule (60 mg total) by mouth daily.    Dispense:  90 capsule    Refill:  1   gabapentin (NEURONTIN) 300 MG capsule    Sig: Take 2 capsules (600 mg total) by mouth at bedtime.    Dispense:  180 capsule    Refill:  10   metoprolol tartrate (LOPRESSOR) 50 MG tablet    Sig: Take 1 tablet (50 mg total) by mouth 2 (two) times daily.    Dispense:  60 tablet    Refill:  10   tiZANidine (ZANAFLEX) 4 MG tablet    Sig: Take 1 tablet (4 mg total) by mouth 2 (two) times daily.    Dispense:  180 tablet    Refill:  1   lidocaine (LIDODERM) 5 %    Sig: Place 1 patch onto the skin daily. Remove & Discard patch within 12 hours or as directed by MD    Dispense:  30 patch    Refill:  0    Follow-up: Return in about 6 months (around 12/24/2023) for Chronic medical conditions.       Hoy Register, MD, FAAFP. [facility] [place], [place] [phone]   06/26/2023, 12:32 PM

## 2023-06-26 NOTE — Patient Instructions (Signed)
VISIT SUMMARY:  During today's visit, we discussed your ongoing lower back and right knee pain, recent weight gain, and menopause. We also reviewed your general health maintenance needs.  YOUR PLAN:  -LOWER BACK PAIN AND RIGHT KNEE PAIN: Your chronic pain in the lower back and right knee is likely worsened by recent weight gain and a sedentary lifestyle. To help manage this, we recommend water therapy for low-impact exercise and over-the-counter Lidoderm patches for back pain. Please contact your orthopedic provider regarding the persistent knee swelling.  -WEIGHT GAIN: Your recent weight gain may be related to menopause, a sedentary lifestyle, and recent steroid injections. We will conduct thyroid function tests to rule out hypothyroidism and refer you to a weight management clinic for further support.  -MENOPAUSE: The absence of your menstrual period for over a year indicates that you are in menopause, which is a normal physiological process. No specific treatment is needed at this time.  -GENERAL HEALTH MAINTENANCE: We will order a Cologuard test for colon cancer screening and administer your flu shot and the second dose of the shingles vaccine today.  INSTRUCTIONS:  Please follow up with your orthopedic provider regarding your knee swelling. Additionally, attend the weight management clinic as referred and complete the thyroid function tests as ordered.

## 2023-06-27 ENCOUNTER — Other Ambulatory Visit: Payer: Self-pay | Admitting: Family Medicine

## 2023-06-27 DIAGNOSIS — E038 Other specified hypothyroidism: Secondary | ICD-10-CM

## 2023-06-27 LAB — CMP14+EGFR
ALT: 17 [IU]/L (ref 0–32)
AST: 16 [IU]/L (ref 0–40)
Albumin: 4.2 g/dL (ref 3.8–4.9)
Alkaline Phosphatase: 78 [IU]/L (ref 44–121)
BUN/Creatinine Ratio: 14 (ref 9–23)
BUN: 17 mg/dL (ref 6–24)
Bilirubin Total: 0.5 mg/dL (ref 0.0–1.2)
CO2: 25 mmol/L (ref 20–29)
Calcium: 9.8 mg/dL (ref 8.7–10.2)
Chloride: 102 mmol/L (ref 96–106)
Creatinine, Ser: 1.25 mg/dL — ABNORMAL HIGH (ref 0.57–1.00)
Globulin, Total: 2.6 g/dL (ref 1.5–4.5)
Glucose: 71 mg/dL (ref 70–99)
Potassium: 4 mmol/L (ref 3.5–5.2)
Sodium: 143 mmol/L (ref 134–144)
Total Protein: 6.8 g/dL (ref 6.0–8.5)
eGFR: 51 mL/min/{1.73_m2} — ABNORMAL LOW (ref >59)

## 2023-06-27 LAB — CBC WITH DIFFERENTIAL/PLATELET
Basophils Absolute: 0 10*3/uL (ref 0.0–0.2)
Basos: 1 %
EOS (ABSOLUTE): 0.3 10*3/uL (ref 0.0–0.4)
Eos: 9 %
Hematocrit: 41 % (ref 34.0–46.6)
Hemoglobin: 13.5 g/dL (ref 11.1–15.9)
Immature Grans (Abs): 0 10*3/uL (ref 0.0–0.1)
Immature Granulocytes: 0 %
Lymphocytes Absolute: 1.4 10*3/uL (ref 0.7–3.1)
Lymphs: 37 %
MCH: 28.9 pg (ref 26.6–33.0)
MCHC: 32.9 g/dL (ref 31.5–35.7)
MCV: 88 fL (ref 79–97)
Monocytes Absolute: 0.4 10*3/uL (ref 0.1–0.9)
Monocytes: 10 %
Neutrophils Absolute: 1.7 10*3/uL (ref 1.4–7.0)
Neutrophils: 43 %
Platelets: 150 10*3/uL (ref 150–450)
RBC: 4.67 x10E6/uL (ref 3.77–5.28)
RDW: 12.9 % (ref 11.7–15.4)
WBC: 3.9 10*3/uL (ref 3.4–10.8)

## 2023-06-27 LAB — LP+NON-HDL CHOLESTEROL
Cholesterol, Total: 151 mg/dL (ref 100–199)
HDL: 54 mg/dL (ref >39)
LDL Chol Calc (NIH): 81 mg/dL (ref 0–99)
Total Non-HDL-Chol (LDL+VLDL): 97 mg/dL (ref 0–129)
Triglycerides: 85 mg/dL (ref 0–149)
VLDL Cholesterol Cal: 16 mg/dL (ref 5–40)

## 2023-06-27 LAB — T3: T3, Total: 95 ng/dL (ref 71–180)

## 2023-06-27 LAB — TSH: TSH: 1.58 u[IU]/mL (ref 0.450–4.500)

## 2023-06-27 LAB — T4, FREE: Free T4: 1.86 ng/dL — ABNORMAL HIGH (ref 0.82–1.77)

## 2023-06-27 MED ORDER — LEVOTHYROXINE SODIUM 125 MCG PO TABS: 125.0000 ug | ORAL_TABLET | Freq: Every day | ORAL | 1 refills | Status: DC

## 2023-07-09 ENCOUNTER — Telehealth: Payer: Self-pay

## 2023-07-09 MED ORDER — CYCLOBENZAPRINE HCL 10 MG PO TABS: 10.0000 mg | ORAL_TABLET | Freq: Three times a day (TID) | ORAL | 1 refills | Status: DC | PRN

## 2023-07-09 NOTE — Telephone Encounter (Signed)
Routing to PCP for review.

## 2023-07-09 NOTE — Telephone Encounter (Signed)
Copied from CRM [id number]. Topic: General - Other >> Jul 09, 2023  9:38 AM Franchot Heidelberg wrote: Reason for CRM: Pt also wants to know if her PCP can prescribe an alternative to her tizanidine since her pharmacy says that medicine is on back order. She also has a question about her lab levels as they relate to tizanidine. Please advise   Best contact: [phone]

## 2023-07-09 NOTE — Telephone Encounter (Signed)
Flexeril has been sent to her Pharmacy. What question does she have about her lab and Tizanidine? She can also send me a my chart message if she would like.

## 2023-07-09 NOTE — Addendum Note (Signed)
Addended by: Hoy Register on: 07/09/2023 12:27 PM   Modules accepted: Orders

## 2023-07-09 NOTE — Telephone Encounter (Signed)
Pt was called and informed that she can call [employer] or ups to schedule a pick up for her cologuard sample.
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
## 2023-07-10 ENCOUNTER — Other Ambulatory Visit: Payer: Self-pay | Admitting: Family Medicine

## 2023-07-10 NOTE — Telephone Encounter (Signed)
Pt has been called and informed of medication change.  Other questions has been answered and documented in another call note.

## 2023-07-31 ENCOUNTER — Ambulatory Visit (HOSPITAL_BASED_OUTPATIENT_CLINIC_OR_DEPARTMENT_OTHER): Payer: Medicaid Other | Admitting: Physical Therapy

## 2023-08-05 ENCOUNTER — Other Ambulatory Visit: Payer: Self-pay | Admitting: Family Medicine

## 2023-08-17 LAB — COLOGUARD

## 2023-08-19 ENCOUNTER — Other Ambulatory Visit: Payer: Self-pay | Admitting: Family Medicine

## 2023-08-19 ENCOUNTER — Encounter (INDEPENDENT_AMBULATORY_CARE_PROVIDER_SITE_OTHER): Payer: Medicaid Other | Admitting: Physician Assistant

## 2023-08-21 ENCOUNTER — Ambulatory Visit (HOSPITAL_BASED_OUTPATIENT_CLINIC_OR_DEPARTMENT_OTHER): Payer: Medicaid Other | Attending: Family Medicine | Admitting: Physical Therapy

## 2023-08-28 LAB — COLOGUARD: COLOGUARD: NEGATIVE

## 2023-08-29 ENCOUNTER — Encounter: Payer: Self-pay | Admitting: Family Medicine

## 2023-09-05 ENCOUNTER — Other Ambulatory Visit: Payer: Self-pay | Admitting: Family Medicine

## 2023-09-08 NOTE — Telephone Encounter (Signed)
This is for E2C2.

## 2023-10-06 ENCOUNTER — Other Ambulatory Visit: Payer: Self-pay | Admitting: Family Medicine

## 2023-10-14 ENCOUNTER — Encounter (INDEPENDENT_AMBULATORY_CARE_PROVIDER_SITE_OTHER): Payer: Self-pay

## 2023-10-23 ENCOUNTER — Telehealth: Payer: Self-pay

## 2023-10-23 NOTE — Telephone Encounter (Signed)
 Patient has been scheduled a mychart video visit to discuss.    Copied from CRM [id number]. Topic: General - Other >> Oct 23, 2023  2:19 PM Shardie S wrote: Reason for CRM: Patient requesting assistance with getting the following medical supplies: toilet seat riser, electric scooter, knee brace, and power lift chair. Please contact patient.  Callback # [phone], do not leave voicemail at this number.  Cellphone # [phone], patient approves leaving voicemail at this number.

## 2023-10-28 ENCOUNTER — Telehealth (HOSPITAL_BASED_OUTPATIENT_CLINIC_OR_DEPARTMENT_OTHER): Admitting: Family Medicine

## 2023-10-28 DIAGNOSIS — G8929 Other chronic pain: Secondary | ICD-10-CM | POA: Diagnosis not present

## 2023-10-28 DIAGNOSIS — M5441 Lumbago with sciatica, right side: Secondary | ICD-10-CM | POA: Diagnosis not present

## 2023-10-28 DIAGNOSIS — M5442 Lumbago with sciatica, left side: Secondary | ICD-10-CM

## 2023-10-28 DIAGNOSIS — M51362 Other intervertebral disc degeneration, lumbar region with discogenic back pain and lower extremity pain: Secondary | ICD-10-CM

## 2023-10-28 MED ORDER — MISC. DEVICES MISC
0 refills | Status: AC
Start: 2023-10-28 — End: ?

## 2023-10-28 MED ORDER — MISC. DEVICES MISC: 0 refills | Status: DC

## 2023-10-28 NOTE — Patient Instructions (Signed)
 VISIT SUMMARY:  During today's visit, we discussed your increasing difficulty with mobility due to sciatica, arthritis, and degenerative disease of the lumbar spine. You reported severe pain and numbness in your right thigh and knee, which has made walking and daily activities challenging. We also reviewed your need for a power lift chair, a raised toilet seat, and a scooter to improve your mobility and quality of life.  YOUR PLAN:  -MOBILITY ISSUES DUE TO SCIATICA AND ARTHRITIS: Sciatica and arthritis are causing tingling, numbness, and pain in your right thigh, knee, and anterior leg, which are impairing your mobility. We will submit documentation to your insurance for a power lift chair and refer you to physical therapy for a scooter evaluation.  -DEGENERATIVE DISEASE OF THE LUMBAR SPINE: Degenerative disease of the lumbar spine is contributing to your back pain and mobility issues. This will be included in the insurance documentation for the power lift chair.  -CHRONIC RIGHT KNEE PAIN: Chronic pain in your right knee is worsening your mobility difficulties. This will also be included in the insurance documentation for the power lift chair.  INSTRUCTIONS:  We will prioritize the referral for a scooter evaluation over aquatic therapy due to insurance constraints and previous no-shows. The physical therapy office will be advised that the referral is specifically for a scooter evaluation and not a duplicate for back pain.
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
## 2023-11-06 ENCOUNTER — Telehealth: Payer: Self-pay

## 2023-11-06 NOTE — Telephone Encounter (Signed)
 Patient is needing a script for a lift chair. I informed her about the referral for the scooter.    Copied from CRM [id number]. Topic: Clinical - Prescription Issue >> Nov 06, 2023 11:00 AM Heidi Barrett wrote: Reason for CRM: Patient would like a power wheelchair sent for prior authorization//Please submit prior authorization with medical necessity//

## 2023-11-07 MED ORDER — MISC. DEVICES MISC: 0 refills | Status: AC

## 2023-11-07 NOTE — Telephone Encounter (Signed)
 Script has been written.

## 2023-11-07 NOTE — Addendum Note (Signed)
 Addended by: Hoy Register on: 11/07/2023 11:47 AM   Modules accepted: Orders

## 2023-11-09 ENCOUNTER — Other Ambulatory Visit: Payer: Self-pay | Admitting: Family Medicine

## 2023-11-09 DIAGNOSIS — F32A Depression, unspecified: Secondary | ICD-10-CM

## 2023-11-09 DIAGNOSIS — F430 Acute stress reaction: Secondary | ICD-10-CM

## 2023-11-11 NOTE — Telephone Encounter (Signed)
 Requested Prescriptions  Pending Prescriptions Disp Refills   hydrOXYzine (ATARAX) 25 MG tablet [Pharmacy Med Name: HYDROXYzine 25mg  tab 25 Tablet] 60 tablet 5    Sig: TAKE ONE (1) TABLET BY MOUTH TWICE DAILY AS NEEDED     Ear, Nose, and Throat:  Antihistamines 2 Failed - 11/11/2023 10:20 AM      Failed - Cr in normal range and within 360 days    Creat  Date Value Ref Range Status  01/16/2015 0.82 0.50 - 1.10 mg/dL Final   Creatinine, Ser  Date Value Ref Range Status  06/26/2023 1.25 (H) 0.57 - 1.00 mg/dL Final         Passed - Valid encounter within last 12 months    Recent Outpatient Visits           2 weeks ago Chronic midline low back pain with bilateral sciatica   [facility] [place]. [facility] Hoy Register, MD   4 months ago Chronic pain of right knee   [facility] - A Dept Of [place]. [facility] Hoy Register, MD   7 months ago Vertigo   [facility] [place] - [facility] Dept Of [place]. [facility] Hoy Register, MD   9 months ago Screening for diabetes mellitus   [facility] [place] [facility] - [facility] Dept Of [place]. [facility] Hoy Register, MD   1 year ago Vertigo   [facility] - A Dept Of [place]. [facility] Hoy Register, MD

## 2023-11-12 ENCOUNTER — Telehealth: Payer: Self-pay | Admitting: Family Medicine

## 2023-11-12 ENCOUNTER — Telehealth (INDEPENDENT_AMBULATORY_CARE_PROVIDER_SITE_OTHER): Payer: Self-pay | Admitting: Family Medicine

## 2023-11-12 NOTE — Telephone Encounter (Signed)
Script has been faxed to [facility].

## 2023-11-12 NOTE — Telephone Encounter (Signed)
 Patient was called and she states that she has not gone back to her orthopedic since her knee injection and she is wanting to try knee braces.

## 2023-11-12 NOTE — Telephone Encounter (Signed)
 Copied from CRM [id number]. Topic: Clinical - Medical Advice  >> Nov 11, 2023  4:12 PM Priscille Loveless wrote: Reason for CRM:Pt is requesting knee braces for both legs. She doesn't want anymore shots due to weight gain and prefers knee braces.  Please advise pt.

## 2023-11-12 NOTE — Telephone Encounter (Signed)
 I would suggest that she touch base with her orthopedic specialist Dr. August Saucer to see if he would prescribe knee brace for her.  Otherwise she can touch base with Dr. Alvis Lemmings when she returns to see if she prescribed knee braces.  I usually send my patients to orthopedics.

## 2023-11-12 NOTE — Telephone Encounter (Signed)
 Copied from CRM [id number]. Topic: General - Other >> Nov 12, 2023  9:58 AM Desma Mcgregor wrote: Reason for CRM: [facility] and pt called in. Pt is requesting a power lift chair and it can be requested from one of the following DME supply companies: 1. [facility] [phone]  2. [facility] [phone] 3. [employer] [phone]  If doctor approves and places order, patient would to be contacted directly.

## 2024-01-02 ENCOUNTER — Other Ambulatory Visit: Payer: Self-pay | Admitting: Family Medicine

## 2024-01-02 DIAGNOSIS — I10 Essential (primary) hypertension: Secondary | ICD-10-CM

## 2024-01-05 NOTE — Telephone Encounter (Signed)
 Too soon for refill for Metoprolol .  Requested Prescriptions  Pending Prescriptions Disp Refills   lidocaine  (LIDODERM ) 5 % [Pharmacy Med Name: LIDOCAINE  5% PATCH 5 Patch] 90 patch 0    Sig: APPLY 1 PATCH TO AFFECTED AREAS DAILY *12 HOURS ON AND 12 HOURS OFF*     Analgesics:  Topicals Failed - 01/05/2024 11:44 AM      Failed - Manual Review: Labs are only required if the patient has taken medication for more than 8 weeks.      Failed - Cr in normal range and within 360 days    Creat  Date Value Ref Range Status  01/16/2015 0.82 0.50 - 1.10 mg/dL Final   Creatinine, Ser  Date Value Ref Range Status  06/26/2023 1.25 (H) 0.57 - 1.00 mg/dL Final         Passed - PLT in normal range and within 360 days    Platelets  Date Value Ref Range Status  06/26/2023 150 150 - 450 x10E3/uL Final         Passed - HGB in normal range and within 360 days    Hemoglobin  Date Value Ref Range Status  06/26/2023 13.5 11.1 - 15.9 g/dL Final         Passed - HCT in normal range and within 360 days    Hematocrit  Date Value Ref Range Status  06/26/2023 41.0 34.0 - 46.6 % Final         Passed - eGFR is 30 or above and within 360 days    GFR, Est African American  Date Value Ref Range Status  01/16/2015 >89 mL/min Final   GFR calc Af Amer  Date Value Ref Range Status  01/14/2020 74 >59 mL/min/1.73 Final    Comment:    **[facility] currently reports eGFR in compliance with the current**   recommendations of the [employer]. [facility] will   update reporting as new guidelines are published from the NKF-ASN   Task force.    GFR, Est Non African American  Date Value Ref Range Status  01/16/2015 85 mL/min Final    Comment:      The estimated GFR is a calculation valid for adults (>=17 years old) that uses the CKD-EPI algorithm to adjust for age and sex. It is   not to be used for children, pregnant women, hospitalized patients,    patients on dialysis, or with rapidly changing  kidney function. According to the NKDEP, eGFR >89 is normal, 60-89 shows mild impairment, 30-59 shows moderate impairment, 15-29 shows severe impairment and <15 is ESRD.      GFR, Estimated  Date Value Ref Range Status  09/26/2022 >60 >60 mL/min Final    Comment:    (NOTE) Calculated using the CKD-EPI Creatinine Equation (2021)    eGFR  Date Value Ref Range Status  06/26/2023 51 (L) >59 mL/min/1.73 Final         Passed - Patient is not pregnant      Passed - Valid encounter within last 12 months    Recent Outpatient Visits           2 months ago Chronic midline low back pain with bilateral sciatica   [facility] [facility] Dept Of [place]. [facility] Joaquin Mulberry, MD   6 months ago Chronic pain of right knee   [facility] [place] - [facility] Dept Of [place]. [facility] Joaquin Mulberry, MD   9  months ago Vertigo   [facility] [place] - A Dept Of [place]. [facility] Joaquin Mulberry, MD   10 months ago Screening for diabetes mellitus   [facility] - A Dept Of [place]. [facility] Joaquin Mulberry, MD   1 year ago Vertigo   [facility] [facility] [place] - A Dept Of [place]. [facility] Adan Holms, Lavelle Posey, MD               metoprolol  tartrate (LOPRESSOR ) 50 MG tablet [Pharmacy Med Name: METOPROLOL  TART 50MG  TABLET 50 Tablet] 60 tablet 11    Sig: TAKE 1 TABLET BY MOUTH TWICE DAILY     Cardiovascular:  Beta Blockers Passed - 01/05/2024 11:44 AM      Passed - Last BP in normal range    BP Readings from Last 1 Encounters:  06/26/23 131/87         Passed - Last Heart Rate in normal range    Pulse Readings from Last 1 Encounters:  06/26/23 72         Passed - Valid encounter within last 6 months    Recent Outpatient Visits           2 months ago Chronic midline low back pain with bilateral sciatica   [place] [facility] [place] [facility] Dept Of [place]. [facility] Joaquin Mulberry, MD   6 months ago Chronic pain of right knee   [place] [facility] [place] [place] - [facility] Dept Of [place]. [facility] Joaquin Mulberry, MD   9 months ago Vertigo   [facility] [facility] [place] - A Dept Of [place]. [facility] Joaquin Mulberry, MD   10 months ago Screening for diabetes mellitus   [facility] [facility] [place] - A Dept Of [place]. [facility] Joaquin Mulberry, MD   1 year ago Vertigo   [facility] [facility] [place] - A Dept Of [place]. [facility] Joaquin Mulberry, MD

## 2024-02-02 ENCOUNTER — Other Ambulatory Visit: Payer: Self-pay | Admitting: Family Medicine

## 2024-02-07 ENCOUNTER — Other Ambulatory Visit: Payer: Self-pay | Admitting: Family Medicine

## 2024-02-07 ENCOUNTER — Other Ambulatory Visit: Payer: Self-pay | Admitting: Internal Medicine

## 2024-02-07 DIAGNOSIS — G8929 Other chronic pain: Secondary | ICD-10-CM

## 2024-02-20 ENCOUNTER — Telehealth: Payer: Self-pay | Admitting: Family Medicine

## 2024-02-20 NOTE — Telephone Encounter (Signed)
 Copied from CRM [id number]. Topic: Clinical - Prescription Issue >> Feb 20, 2024  1:46 PM Delon T wrote:  Reason for CRM: metoprolol  tartrate (LOPRESSOR ) 50 MG tablet - Patient did not receive refill in her mail order- need it sent to a pharmacy that can deliver it today-  please call patient [phone]

## 2024-02-24 ENCOUNTER — Other Ambulatory Visit: Payer: Self-pay

## 2024-02-24 ENCOUNTER — Telehealth: Payer: Self-pay | Admitting: Family Medicine

## 2024-02-24 DIAGNOSIS — I10 Essential (primary) hypertension: Secondary | ICD-10-CM

## 2024-02-24 MED ORDER — METOPROLOL TARTRATE 50 MG PO TABS: 50.0000 mg | ORAL_TABLET | Freq: Two times a day (BID) | ORAL | 10 refills | Status: AC

## 2024-02-24 NOTE — Telephone Encounter (Signed)
 Copied from CRM [id number]. Topic: Clinical - Order For Equipment >> Feb 24, 2024  2:26 PM Carlatta H wrote:  Reason for CRM: Please call patient about power lift chair//Also patient need to get a bedside commode//

## 2024-02-24 NOTE — Telephone Encounter (Signed)
Medication has been filled 

## 2024-02-24 NOTE — Telephone Encounter (Signed)
 Called [facility] pharmacy and they stated that they need a new script with refills

## 2024-02-24 NOTE — Telephone Encounter (Signed)
 Attempted to contact patient and no VM picked up to leave a message.

## 2024-03-03 ENCOUNTER — Other Ambulatory Visit: Payer: Self-pay | Admitting: Family Medicine

## 2024-03-03 DIAGNOSIS — I10 Essential (primary) hypertension: Secondary | ICD-10-CM

## 2024-03-03 DIAGNOSIS — E038 Other specified hypothyroidism: Secondary | ICD-10-CM

## 2024-03-03 DIAGNOSIS — G8929 Other chronic pain: Secondary | ICD-10-CM

## 2024-03-04 ENCOUNTER — Other Ambulatory Visit: Payer: Self-pay | Admitting: Family Medicine

## 2024-03-04 ENCOUNTER — Telehealth: Payer: Self-pay | Admitting: Family Medicine

## 2024-03-04 DIAGNOSIS — G8929 Other chronic pain: Secondary | ICD-10-CM

## 2024-03-04 DIAGNOSIS — E038 Other specified hypothyroidism: Secondary | ICD-10-CM

## 2024-03-04 DIAGNOSIS — F430 Acute stress reaction: Secondary | ICD-10-CM

## 2024-03-04 DIAGNOSIS — E7800 Pure hypercholesterolemia, unspecified: Secondary | ICD-10-CM

## 2024-03-04 DIAGNOSIS — F32A Depression, unspecified: Secondary | ICD-10-CM

## 2024-03-04 DIAGNOSIS — I10 Essential (primary) hypertension: Secondary | ICD-10-CM

## 2024-03-04 NOTE — Telephone Encounter (Unsigned)
 Copied from CRM [id number]. Topic: Clinical - Medication Refill >> Mar 04, 2024  1:00 PM Thliyah D wrote: Medication:  lidocaine  (LIDODERM ) 5 %    Has the patient contacted their pharmacy? No (Agent: If no, request that the patient contact the pharmacy for the refill. If patient does not wish to contact the pharmacy document the reason why and proceed with request.) (Agent: If yes, when and what did the pharmacy advise?)  This is the patient's preferred pharmacy:  [facility], [place] - [address] [place] [address] Phone: [phone] Fax: [phone]  Is this the correct pharmacy for this prescription? Yes If no, delete pharmacy and type the correct one.   Has the prescription been filled recently? No  Is the patient out of the medication? Yes  Has the patient been seen for an appointment in the last year OR does the patient have an upcoming appointment? Yes  Can we respond through MyChart? No  Agent: Please be advised that Rx refills may take up to 3 business days. We ask that you follow-up with your pharmacy.

## 2024-03-04 NOTE — Telephone Encounter (Signed)
 Copied from CRM [id number]. Topic: Clinical - Medication Question >> Mar 04, 2024  1:02 PM Gustabo D wrote:  Patient wants to know if she can take wegovy something for weightloss

## 2024-03-04 NOTE — Telephone Encounter (Unsigned)
 Copied from CRM [id number]. Topic: Clinical - Medication Refill >> Mar 04, 2024 12:55 PM Uyopbjy D wrote: Medication: solifenacin  (VESICARE ) 5 MG tablet, solifenacin  (VESICARE ) 5 MG tablet,  cyclobenzaprine  (FLEXERIL ) 10 MG tablet    Has the patient contacted their pharmacy? Yes (Agent: If no, request that the patient contact the pharmacy for the refill. If patient does not wish to contact the pharmacy document the reason why and proceed with request.) (Agent: If yes, when and what did the pharmacy advise?)  This is the patient's preferred pharmacy:  [facility], [place] - [address] [place] [address] Phone: [phone] Fax: [phone]  Is this the correct pharmacy for this prescription? Yes If no, delete pharmacy and type the correct one.   Has the prescription been filled recently? No  Is the patient out of the medication? Yes  Has the patient been seen for an appointment in the last year OR does the patient have an upcoming appointment? Yes  Can we respond through MyChart? No  Agent: Please be advised that Rx refills may take up to 3 business days. We ask that you follow-up with your pharmacy.

## 2024-03-04 NOTE — Telephone Encounter (Unsigned)
 Copied from CRM [id number]. Topic: Clinical - Medication Refill >> Mar 04, 2024 12:48 PM Cleave MATSU wrote: Medication: amlodipine  ,atorvastatin , duloxetine  , hydroxyzine , levothyroxine   Has the patient contacted their pharmacy? Yes (Agent: If no, request that the patient contact the pharmacy for the refill. If patient does not wish to contact the pharmacy document the reason why and proceed with request.) (Agent: If yes, when and what did the pharmacy advise?)  This is the patient's preferred pharmacy:  [facility], [place] - [address] [place] [address] Phone: [phone] Fax: [phone]  Is this the correct pharmacy for this prescription? Yes If no, delete pharmacy and type the correct one.   Has the prescription been filled recently? No  Is the patient out of the medication? Yes  Has the patient been seen for an appointment in the last year OR does the patient have an upcoming appointment? Yes  Can we respond through MyChart? Yes  Agent: Please be advised that Rx refills may take up to 3 business days. We ask that you follow-up with your pharmacy.

## 2024-03-05 MED ORDER — ATORVASTATIN CALCIUM 20 MG PO TABS: 20.0000 mg | ORAL_TABLET | Freq: Every day | ORAL | 0 refills | Status: DC

## 2024-03-05 MED ORDER — LIDOCAINE 5 % EX PTCH: 1.0000 | MEDICATED_PATCH | Freq: Two times a day (BID) | CUTANEOUS | 0 refills | Status: DC

## 2024-03-05 NOTE — Telephone Encounter (Signed)
 Requested Prescriptions  Pending Prescriptions Disp Refills   lidocaine  (LIDODERM ) 5 % 60 patch     Sig: Place 1 patch onto the skin every 12 (twelve) hours. Remove & Discard patch within 12 hours or as directed by MDAPPLY 1 PATCH TO AFFECTED AREAS DAILY *12 HOURS ON AND 12 HOURS OFF*     Analgesics:  Topicals Failed - 03/05/2024 12:12 PM      Failed - Manual Review: Labs are only required if the patient has taken medication for more than 8 weeks.      Failed - Cr in normal range and within 360 days    Creat  Date Value Ref Range Status  01/16/2015 0.82 0.50 - 1.10 mg/dL Final   Creatinine, Ser  Date Value Ref Range Status  06/26/2023 1.25 (H) 0.57 - 1.00 mg/dL Final         Passed - PLT in normal range and within 360 days    Platelets  Date Value Ref Range Status  06/26/2023 150 150 - 450 x10E3/uL Final         Passed - HGB in normal range and within 360 days    Hemoglobin  Date Value Ref Range Status  06/26/2023 13.5 11.1 - 15.9 g/dL Final         Passed - HCT in normal range and within 360 days    Hematocrit  Date Value Ref Range Status  06/26/2023 41.0 34.0 - 46.6 % Final         Passed - eGFR is 30 or above and within 360 days    GFR, Est African American  Date Value Ref Range Status  01/16/2015 >89 mL/min Final   GFR calc Af Amer  Date Value Ref Range Status  01/14/2020 74 >59 mL/min/1.73 Final    Comment:    **[facility] currently reports eGFR in compliance with the current**   recommendations of the [employer]. [facility] will   update reporting as new guidelines are published from the NKF-ASN   Task force.    GFR, Est Non African American  Date Value Ref Range Status  01/16/2015 85 mL/min Final    Comment:      The estimated GFR is a calculation valid for adults (>=9 years old) that uses the CKD-EPI algorithm to adjust for age and sex. It is   not to be used for children, pregnant women, hospitalized patients,    patients on dialysis, or  with rapidly changing kidney function. According to the NKDEP, eGFR >89 is normal, 60-89 shows mild impairment, 30-59 shows moderate impairment, 15-29 shows severe impairment and <15 is ESRD.      GFR, Estimated  Date Value Ref Range Status  09/26/2022 >60 >60 mL/min Final    Comment:    (NOTE) Calculated using the CKD-EPI Creatinine Equation (2021)    eGFR  Date Value Ref Range Status  06/26/2023 51 (L) >59 mL/min/1.73 Final         Passed - Patient is not pregnant      Passed - Valid encounter within last 12 months    Recent Outpatient Visits           4 months ago Chronic midline low back pain with bilateral sciatica   [facility] [facility] Dept Of [place]. [facility] Delbert Clam, MD   8 months ago Chronic pain of right knee   [facility] Dept Of [place]. [facility]  [name], [name], MD   11 months ago Vertigo   [facility] [place] - A Dept Of [place]. [facility] Delbert Clam, MD   1 year ago Screening for diabetes mellitus   [place] [facility] [facility] [place] - A Dept Of [place]. [facility] Delbert Clam, MD   1 year ago Vertigo   [facility] [facility] [place] - A Dept Of [place]. [facility] Delbert Clam, MD

## 2024-03-05 NOTE — Telephone Encounter (Signed)
 Requested medication (s) are due for refill today:   Provider to review  Requested medication (s) are on the active medication list:   Yes for both  Future visit scheduled:   Yes 9/2 with Dr. [name];    LOV video 10/28/2023 with Dr. [name]   Last ordered: Vesicare  02/03/2024 #90, 11 refills;   Flexeril  02/09/2024 #270, 0 refills  Non delegated refill    Requested Prescriptions  Pending Prescriptions Disp Refills   solifenacin  (VESICARE ) 5 MG tablet 90 tablet 11    Sig: Take 1 tablet (5 mg total) by mouth daily.     Urology:  Bladder Agents 2 Failed - 03/05/2024 12:29 PM      Failed - Cr in normal range and within 360 days    Creat  Date Value Ref Range Status  01/16/2015 0.82 0.50 - 1.10 mg/dL Final   Creatinine, Ser  Date Value Ref Range Status  06/26/2023 1.25 (H) 0.57 - 1.00 mg/dL Final         Passed - ALT in normal range and within 360 days    ALT  Date Value Ref Range Status  06/26/2023 17 0 - 32 IU/L Final         Passed - AST in normal range and within 360 days    AST  Date Value Ref Range Status  06/26/2023 16 0 - 40 IU/L Final         Passed - Valid encounter within last 12 months    Recent Outpatient Visits           4 months ago Chronic midline low back pain with bilateral sciatica   [place] [facility] [place]. [facility] Delbert Clam, MD   8 months ago Chronic pain of right knee   [facility] [place] [facility] - [facility] Dept Of [place]. [facility] Delbert Clam, MD   11 months ago Vertigo   [place] [facility] [place] - [facility] Dept Of [place]. [facility] Delbert Clam, MD   1 year ago Screening for diabetes mellitus   [place] [facility] [place] - [facility] Dept Of [place]. [facility] Delbert Clam, MD   1 year ago Vertigo   [place] [facility] - A Dept Of [place]. [facility] Delbert, Clam, MD               cyclobenzaprine  (FLEXERIL ) 10 MG tablet 270  tablet 0    Sig: Take 1 tablet (10 mg total) by mouth 3 (three) times daily as needed for muscle spasms.     Not Delegated - Analgesics:  Muscle Relaxants Failed - 03/05/2024 12:29 PM      Failed - This refill cannot be delegated      Passed - Valid encounter within last 6 months    Recent Outpatient Visits           4 months ago Chronic midline low back pain with bilateral sciatica   [place] [facility] Dept Of [place]. [facility] Delbert Clam, MD   8 months ago Chronic pain of right knee   [place] [facility] - A Dept Of [place]. [facility] Delbert Clam, MD   11 months ago Vertigo   [place] [facility] [place] - A Dept Of . [facility] Delbert Clam, MD   1 year ago Screening for  diabetes mellitus   [place] [facility] [place] [place] - [facility] Dept Of [place]. [facility] Delbert Clam, MD   1 year ago Vertigo   [place] [facility] [place] - [facility] Dept Of [place]. [facility] Delbert Clam, MD

## 2024-03-05 NOTE — Telephone Encounter (Signed)
 Requested Prescriptions  Pending Prescriptions Disp Refills   amLODipine  (NORVASC ) 10 MG tablet 90 tablet     Sig: Take 1 tablet (10 mg total) by mouth daily. for blood pressure     Cardiovascular: Calcium  Channel Blockers 2 Passed - 03/05/2024 11:56 AM      Passed - Last BP in normal range    BP Readings from Last 1 Encounters:  06/26/23 131/87         Passed - Last Heart Rate in normal range    Pulse Readings from Last 1 Encounters:  06/26/23 72         Passed - Valid encounter within last 6 months    Recent Outpatient Visits           4 months ago Chronic midline low back pain with bilateral sciatica   [place] [facility] [place] [facility] Dept Of [place]. [facility] Delbert Clam, MD   8 months ago Chronic pain of right knee   [place] [facility] - [facility] Dept Of [place]. [facility] Delbert Clam, MD   11 months ago Vertigo   [place] [facility] [place] - A Dept Of [place]. [facility] Delbert Clam, MD   1 year ago Screening for diabetes mellitus    [facility] - A Dept Of [place]. [facility] Delbert Clam, MD   1 year ago Vertigo   [place] [facility] [place] - A Dept Of [place]. [place] [facility] [place], Clam, MD               atorvastatin  (LIPITOR) 20 MG tablet 90 tablet 0    Sig: Take 1 tablet (20 mg total) by mouth daily.     Cardiovascular:  Antilipid - Statins Failed - 03/05/2024 11:56 AM      Failed - Lipid Panel in normal range within the last 12 months    Cholesterol, Total  Date Value Ref Range Status  06/26/2023 151 100 - 199 mg/dL Final   LDL Chol Calc (NIH)  Date Value Ref Range Status  06/26/2023 81 0 - 99 mg/dL Final   HDL  Date Value Ref Range Status  06/26/2023 54 >39 mg/dL Final   Triglycerides  Date Value Ref Range Status  06/26/2023 85 0 - 149 mg/dL Final         Passed - Patient is not pregnant      Passed - Valid encounter within last  12 months    Recent Outpatient Visits           4 months ago Chronic midline low back pain with bilateral sciatica   [place] [facility] Dept Of [place]. [facility] Delbert Clam, MD   8 months ago Chronic pain of right knee   [place] [facility] [place] - A Dept Of [place]. [facility] Delbert Clam, MD   11 months ago Vertigo   [place] [facility] [place] - A Dept Of [place]. [facility] Delbert Clam, MD   1 year ago Screening for diabetes mellitus   [place] [facility] [place] - A Dept Of [place]. [facility] Delbert Clam, MD   1 year ago Vertigo   [place] [facility] [place] - A Dept Of [place]. [facility] Delbert Clam, MD  DULoxetine  (CYMBALTA ) 60 MG capsule 90 capsule     Sig: Take 1 capsule (60 mg total) by mouth daily.     Psychiatry: Antidepressants - SNRI - duloxetine  Failed - 03/05/2024 11:56 AM      Failed - Cr in normal range and within 360 days    Creat  Date Value Ref Range Status  01/16/2015 0.82 0.50 - 1.10 mg/dL Final   Creatinine, Ser  Date Value Ref Range Status  06/26/2023 1.25 (H) 0.57 - 1.00 mg/dL Final         Passed - eGFR is 30 or above and within 360 days    GFR, Est African American  Date Value Ref Range Status  01/16/2015 >89 mL/min Final   GFR calc Af Amer  Date Value Ref Range Status  01/14/2020 74 >59 mL/min/1.73 Final    Comment:    **[facility] currently reports eGFR in compliance with the current**   recommendations of the [employer]. [facility] will   update reporting as new guidelines are published from the NKF-ASN   Task force.    GFR, Est Non African American  Date Value Ref Range Status  01/16/2015 85 mL/min Final    Comment:      The estimated GFR is a calculation valid for adults (>=26 years old) that uses the CKD-EPI algorithm to adjust for age and sex. It is   not to be used for children,  pregnant women, hospitalized patients,    patients on dialysis, or with rapidly changing kidney function. According to the NKDEP, eGFR >89 is normal, 60-89 shows mild impairment, 30-59 shows moderate impairment, 15-29 shows severe impairment and <15 is ESRD.      GFR, Estimated  Date Value Ref Range Status  09/26/2022 >60 >60 mL/min Final    Comment:    (NOTE) Calculated using the CKD-EPI Creatinine Equation (2021)    eGFR  Date Value Ref Range Status  06/26/2023 51 (L) >59 mL/min/1.73 Final         Passed - Completed PHQ-2 or PHQ-9 in the last 360 days      Passed - Last BP in normal range    BP Readings from Last 1 Encounters:  06/26/23 131/87         Passed - Valid encounter within last 6 months    Recent Outpatient Visits           4 months ago Chronic midline low back pain with bilateral sciatica   [place] [facility] Of [place]. [facility] Delbert Clam, MD   8 months ago Chronic pain of right knee   [place] [facility] [place] - A Dept Of [place]. [facility] Delbert Clam, MD   11 months ago Vertigo   [place] [facility] [place] - A Dept Of [place]. [facility] Delbert Clam, MD   1 year ago Screening for diabetes mellitus   [place] [facility] [place] - A Dept Of [place]. [facility] Delbert Clam, MD   1 year ago Vertigo   [place] [facility] [place] - A Dept Of [place]. [facility] [place], Clam, MD               hydrOXYzine  (ATARAX ) 25 MG tablet 60 tablet 5     Ear, Nose, and Throat:  Antihistamines 2 Failed - 03/05/2024 11:56 AM      Failed - Cr in normal range and  within 360 days    Creat  Date Value Ref Range Status  01/16/2015 0.82 0.50 - 1.10 mg/dL Final   Creatinine, Ser  Date Value Ref Range Status  06/26/2023 1.25 (H) 0.57 - 1.00 mg/dL Final         Passed - Valid encounter within last 12 months    Recent Outpatient Visits            4 months ago Chronic midline low back pain with bilateral sciatica   [place] [facility] Dept Of [place]. [facility] Delbert Clam, MD   8 months ago Chronic pain of right knee   [place] [facility] [place] - A Dept Of [place]. [facility] Delbert Clam, MD   11 months ago Vertigo   [place] [facility] [place] - A Dept Of [place]. [facility] Delbert Clam, MD   1 year ago Screening for diabetes mellitus   [place] [facility] [place] - A Dept Of [place] . [facility] Delbert Clam, MD   1 year ago Vertigo   [place] [facility] [facility] - A Dept Of [place]. [facility] Delbert, Clam, MD               levothyroxine  (SYNTHROID ) 125 MCG tablet 90 tablet     Sig: Take 1 tablet (125 mcg total) by mouth daily before breakfast.     Endocrinology:  Hypothyroid Agents Passed - 03/05/2024 11:56 AM      Passed - TSH in normal range and within 360 days    TSH  Date Value Ref Range Status  06/26/2023 1.580 0.450 - 4.500 uIU/mL Final         Passed - Valid encounter within last 12 months    Recent Outpatient Visits           4 months ago Chronic midline low back pain with bilateral sciatica   [place] [facility] [place] - A Dept Of [place]. [facility] Delbert Clam, MD   8 months ago Chronic pain of right knee   [place] [facility] [place] - A Dept Of [place]. [facility] Delbert Clam, MD   11 months ago Vertigo   [place] [facility] [place] - A Dept Of [place]. [facility] Delbert Clam, MD   1 year ago Screening for diabetes mellitus   [place] [facility] [place] - A Dept Of [place]. [facility] Delbert Clam, MD   1 year ago Vertigo   [place] [facility] [place] - A Dept Of [place]. [facility] Delbert Clam, MD

## 2024-04-02 ENCOUNTER — Telehealth: Payer: Self-pay | Admitting: Family Medicine

## 2024-04-02 NOTE — Telephone Encounter (Signed)
 Pt confirmed appt 8/29

## 2024-04-06 ENCOUNTER — Telehealth: Payer: Self-pay | Admitting: Family Medicine

## 2024-04-06 ENCOUNTER — Encounter: Admitting: Family Medicine

## 2024-04-06 NOTE — Telephone Encounter (Signed)
 Copied from CRM [id number]. Topic: Appointments - Scheduling Inquiry for Clinic >> Apr 02, 2024  5:25 PM DeAngela L wrote: Reason for CRM: patient would like to ask if the office could schedule her for a mammogram appointment   Pt num [phone] (M)

## 2024-04-07 NOTE — Telephone Encounter (Signed)
 Pt was called and given information to call and schedule.

## 2024-04-27 ENCOUNTER — Other Ambulatory Visit (HOSPITAL_BASED_OUTPATIENT_CLINIC_OR_DEPARTMENT_OTHER): Payer: Self-pay | Admitting: Family Medicine

## 2024-04-27 DIAGNOSIS — Z1231 Encounter for screening mammogram for malignant neoplasm of breast: Secondary | ICD-10-CM

## 2024-05-03 ENCOUNTER — Other Ambulatory Visit: Payer: Self-pay | Admitting: Family Medicine

## 2024-05-03 DIAGNOSIS — F419 Anxiety disorder, unspecified: Secondary | ICD-10-CM

## 2024-05-03 DIAGNOSIS — F430 Acute stress reaction: Secondary | ICD-10-CM

## 2024-05-07 ENCOUNTER — Ambulatory Visit: Payer: Self-pay

## 2024-05-07 NOTE — Telephone Encounter (Signed)
 Noted

## 2024-05-07 NOTE — Telephone Encounter (Signed)
 FYI Only or Action Required?: FYI only for provider.  Patient was last seen in primary care on 10/28/2023 by [name], [name], MD.  Called Nurse Triage reporting Leg Pain.  Symptoms began several weeks ago.  Interventions attempted: OTC medications: Flexeril , Ibuprofen . Reports as being effective, stopped taking for a while to see if she could get by. Advised to restart until she can be seen by a provider.  Symptoms are: gradually worsening.  Triage Disposition: See PCP When Office is Open (Within 3 Days)  Patient/caregiver understands and will follow disposition?: Yes D/t transportation limitations, pt requires transportation to be arrange 2-3 days in advance via insurance. Scheduled for earliest appt available on Tuesday. Encouraged to go to UC if symptoms become worse until then.  Copied from CRM [id number]. Topic: Clinical - Red Word Triage >> May 07, 2024 11:36 AM Heidi Barrett wrote: Red Word that prompted transfer to Nurse Triage: Patient had been experience pain and weakness in her right leg, feels like it is going to give out and she will fall.  Reason for Disposition  [1] MODERATE pain (e.g., interferes with normal activities, limping) AND [2] present > 3 days  Answer Assessment - Initial Assessment Questions 1. ONSET: When did the pain start?      2 weeks ago  2. LOCATION: Where is the pain located?      Both legs. Outside of legs.  3. PAIN: How bad is the pain?    (Scale 1-10; or mild, moderate, severe)     8/10. Feels like sciatic pain on right side per pt.  4. WORK OR EXERCISE: Has there been any recent work or exercise that involved this part of the body?      Has been lifting some heavy items around the house.  5. CAUSE: What do you think is causing the leg pain?     Heavy lifting, sciatica.   6. OTHER SYMPTOMS: Do you have any other symptoms? (e.g., chest pain, back pain, breathing difficulty, swelling, rash, fever, numbness, weakness)     Weakness. Able  to walk using FWW. Fell 2 days ago d/t legs giving out, did not hit head. Swelling ankle.  7. PREGNANCY: Is there any chance you are pregnant? When was your last menstrual period?     No  Protocols used: Leg Pain-A-AH

## 2024-05-10 ENCOUNTER — Ambulatory Visit: Payer: Self-pay

## 2024-05-10 NOTE — Telephone Encounter (Signed)
 FYI Only or Action Required?: FYI only for provider.  Patient was last seen in primary care on 10/28/2023 by [name], [name], MD.  Called Nurse Triage reporting Appointment.  Triage Disposition: Duplicate Contact Calls  Patient/caregiver understands and will follow disposition?: Yes         Copied from CRM [id number]. Topic: Clinical - Red Word Triage >> May 07, 2024 11:36 AM Heidi Barrett wrote: Red Word that prompted transfer to Nurse Triage: Patient had been experience pain and weakness in her right leg, feels like it is going to give out and she will fall. >> May 10, 2024  3:02 PM [name] wrote: Pt want to reschdule her appt due to transportation issues  Reason for Disposition  Caller has already spoken with the doctor (or NP/PA, pharmacist) and has no further questions.    Pt recently triaged last Friday for leg pain. Pt reports that care advice of taking ibuprofen /muscle relaxers have helped over the weekend. Pt wishes to cancel acute visit d/t sx improvement. Pt notified of possible cancellation fee.  Additionally, pt wanting to start on weight-loss medication so would like to schedule physical/annual appt. Triage noted that she has an upcoming appt next month. Pt verbalized understanding and will discuss weight concerns at that appt.  Answer Assessment - Initial Assessment Questions 1. REASON FOR CALL: What is the main reason for your call? or How can I best help you?     Pt calling to reschedule AV from triage on Friday. Pt reports taking muscle relaxer/ibuprofen  with + relief. Pt reports [employer] 2. SYMPTOMS : Do you have any symptoms?      *No Answer* 3. OTHER QUESTIONS: Do you have any other questions?     *No Answer*  Protocols used: Information Only Call - No Triage-A-AH, No Contact or Duplicate Contact Call-A-AH

## 2024-05-10 NOTE — Telephone Encounter (Signed)
 Noted

## 2024-05-11 ENCOUNTER — Ambulatory Visit: Payer: Self-pay | Admitting: Family Medicine

## 2024-05-12 ENCOUNTER — Encounter: Admitting: Family Medicine

## 2024-05-24 ENCOUNTER — Other Ambulatory Visit: Payer: Self-pay | Admitting: Family Medicine

## 2024-05-24 DIAGNOSIS — R928 Other abnormal and inconclusive findings on diagnostic imaging of breast: Secondary | ICD-10-CM

## 2024-05-25 ENCOUNTER — Other Ambulatory Visit: Payer: Self-pay | Admitting: Family Medicine

## 2024-05-25 DIAGNOSIS — N6489 Other specified disorders of breast: Secondary | ICD-10-CM

## 2024-05-25 DIAGNOSIS — R928 Other abnormal and inconclusive findings on diagnostic imaging of breast: Secondary | ICD-10-CM

## 2024-06-01 ENCOUNTER — Other Ambulatory Visit: Payer: Self-pay | Admitting: Family Medicine

## 2024-06-01 DIAGNOSIS — F419 Anxiety disorder, unspecified: Secondary | ICD-10-CM

## 2024-06-01 DIAGNOSIS — F430 Acute stress reaction: Secondary | ICD-10-CM

## 2024-06-23 ENCOUNTER — Ambulatory Visit (INDEPENDENT_AMBULATORY_CARE_PROVIDER_SITE_OTHER)

## 2024-06-23 ENCOUNTER — Encounter: Admitting: Family Medicine

## 2024-06-30 ENCOUNTER — Other Ambulatory Visit: Payer: Self-pay | Admitting: Family Medicine

## 2024-06-30 DIAGNOSIS — F32A Depression, unspecified: Secondary | ICD-10-CM

## 2024-06-30 DIAGNOSIS — F430 Acute stress reaction: Secondary | ICD-10-CM

## 2024-08-02 ENCOUNTER — Other Ambulatory Visit: Payer: Self-pay | Admitting: Family Medicine

## 2024-08-02 DIAGNOSIS — E7800 Pure hypercholesterolemia, unspecified: Secondary | ICD-10-CM

## 2024-08-18 ENCOUNTER — Ambulatory Visit: Admitting: Family Medicine

## 2024-09-01 ENCOUNTER — Other Ambulatory Visit: Payer: Self-pay | Admitting: Family Medicine

## 2024-09-01 DIAGNOSIS — F430 Acute stress reaction: Secondary | ICD-10-CM

## 2024-09-01 DIAGNOSIS — F419 Anxiety disorder, unspecified: Secondary | ICD-10-CM

## 2024-09-01 DIAGNOSIS — E7800 Pure hypercholesterolemia, unspecified: Secondary | ICD-10-CM

## 2024-10-05 ENCOUNTER — Ambulatory Visit: Admitting: Family Medicine
